# Patient Record
Sex: Female | Born: 1985
Health system: Southern US, Community
[De-identification: ages and names within clinical notes are randomized; demographics above are authoritative.]

## PROBLEM LIST (undated history)

## (undated) ENCOUNTER — Inpatient Hospital Stay (HOSPITAL_COMMUNITY): Payer: 59

## (undated) DIAGNOSIS — K859 Acute pancreatitis without necrosis or infection, unspecified: Secondary | ICD-10-CM

## (undated) DIAGNOSIS — F419 Anxiety disorder, unspecified: Secondary | ICD-10-CM

## (undated) DIAGNOSIS — K219 Gastro-esophageal reflux disease without esophagitis: Secondary | ICD-10-CM

## (undated) DIAGNOSIS — R87629 Unspecified abnormal cytological findings in specimens from vagina: Secondary | ICD-10-CM

## (undated) HISTORY — DX: Unspecified abnormal cytological findings in specimens from vagina: R87.629

## (undated) HISTORY — DX: Anxiety disorder, unspecified: F41.9

## (undated) HISTORY — PX: ECTOPIC PREGNANCY SURGERY: SHX613

---

## 2009-08-18 ENCOUNTER — Ambulatory Visit: Payer: Self-pay | Admitting: Radiology

## 2009-08-18 ENCOUNTER — Emergency Department (HOSPITAL_BASED_OUTPATIENT_CLINIC_OR_DEPARTMENT_OTHER): Admission: EM | Admit: 2009-08-18 | Discharge: 2009-08-18 | Payer: Self-pay | Admitting: Emergency Medicine

## 2010-11-27 ENCOUNTER — Emergency Department (HOSPITAL_BASED_OUTPATIENT_CLINIC_OR_DEPARTMENT_OTHER)
Admission: EM | Admit: 2010-11-27 | Discharge: 2010-11-27 | Payer: Self-pay | Source: Home / Self Care | Admitting: Emergency Medicine

## 2010-11-27 LAB — COMPREHENSIVE METABOLIC PANEL
ALT: 14 U/L (ref 0–35)
Alkaline Phosphatase: 66 U/L (ref 39–117)
BUN: 6 mg/dL (ref 6–23)
CO2: 29 mEq/L (ref 19–32)
Calcium: 9.4 mg/dL (ref 8.4–10.5)
Chloride: 107 mEq/L (ref 96–112)
Creatinine, Ser: 0.8 mg/dL (ref 0.4–1.2)
GFR calc non Af Amer: >60 mL/min (ref >60)

## 2010-11-27 LAB — DIFFERENTIAL
Basophils Absolute: 0 10*3/uL (ref 0.0–0.1)
Basophils Relative: 0 % (ref 0–1)
Eosinophils Absolute: 0 10*3/uL (ref 0.0–0.7)
Eosinophils Relative: 0 % (ref 0–5)
Lymphocytes Relative: 25 % (ref 12–46)
Lymphs Abs: 1.8 10*3/uL (ref 0.7–4.0)
Monocytes Absolute: 0.4 10*3/uL (ref 0.1–1.0)
Neutrophils Relative %: 70 % (ref 43–77)

## 2010-11-27 LAB — CBC
HCT: 39.2 % (ref 36.0–46.0)
Hemoglobin: 13.5 g/dL (ref 12.0–15.0)
MCH: 28.3 pg (ref 26.0–34.0)
Platelets: 190 10*3/uL (ref 150–400)
RDW: 13.6 % (ref 11.5–15.5)

## 2010-11-27 LAB — URINALYSIS, ROUTINE W REFLEX MICROSCOPIC
Hgb urine dipstick: NEGATIVE
Nitrite: NEGATIVE
pH: 8.5 — ABNORMAL HIGH (ref 5.0–8.0)

## 2010-11-27 LAB — WET PREP, GENITAL: Trich, Wet Prep: NONE SEEN

## 2010-11-28 LAB — GC/CHLAMYDIA PROBE AMP, GENITAL: Chlamydia, DNA Probe: NEGATIVE

## 2011-01-31 LAB — URINALYSIS, ROUTINE W REFLEX MICROSCOPIC
Bilirubin Urine: NEGATIVE
Nitrite: NEGATIVE
Specific Gravity, Urine: 1.01 (ref 1.005–1.030)
Urobilinogen, UA: 0.2 mg/dL (ref 0.0–1.0)
pH: 7 (ref 5.0–8.0)

## 2011-01-31 LAB — CBC
HCT: 43.1 % (ref 36.0–46.0)
MCHC: 33.1 g/dL (ref 30.0–36.0)
MCV: 85.2 fL (ref 78.0–100.0)
RDW: 13.3 % (ref 11.5–15.5)
WBC: 5.5 10*3/uL (ref 4.0–10.5)

## 2011-01-31 LAB — BASIC METABOLIC PANEL
BUN: 8 mg/dL (ref 6–23)
GFR calc Af Amer: >60 mL/min (ref >60)
Sodium: 141 mEq/L (ref 135–145)

## 2011-01-31 LAB — DIFFERENTIAL
Eosinophils Relative: 0 % (ref 0–5)
Monocytes Absolute: 0.3 10*3/uL (ref 0.1–1.0)
Neutro Abs: 3.1 10*3/uL (ref 1.7–7.7)

## 2011-07-30 ENCOUNTER — Encounter: Payer: Self-pay | Admitting: *Deleted

## 2011-07-30 ENCOUNTER — Other Ambulatory Visit: Payer: Self-pay

## 2011-07-30 ENCOUNTER — Emergency Department (HOSPITAL_BASED_OUTPATIENT_CLINIC_OR_DEPARTMENT_OTHER)
Admission: EM | Admit: 2011-07-30 | Discharge: 2011-07-31 | Disposition: A | Discharge status: Home / Self Care | Payer: 59 | Attending: Emergency Medicine | Admitting: Emergency Medicine

## 2011-07-30 DIAGNOSIS — R071 Chest pain on breathing: Secondary | ICD-10-CM | POA: Insufficient documentation

## 2011-07-30 DIAGNOSIS — R0789 Other chest pain: Secondary | ICD-10-CM

## 2011-07-30 DIAGNOSIS — IMO0001 Reserved for inherently not codable concepts without codable children: Secondary | ICD-10-CM | POA: Insufficient documentation

## 2011-07-30 DIAGNOSIS — F17200 Nicotine dependence, unspecified, uncomplicated: Secondary | ICD-10-CM | POA: Insufficient documentation

## 2011-07-30 NOTE — ED Notes (Signed)
Pt states that CP worsens when she has to use her left hand for movement. Denies SOB, no N/V, no diaphoresis.

## 2011-07-30 NOTE — ED Notes (Signed)
Lifting boxes at work. Sternal pain afterward for the past week. Painful with movement or when she picks up objects. States she feels like she pulled a muscle.

## 2011-07-30 NOTE — ED Notes (Signed)
PCXR completed per MD orders, NAD noted at this time.

## 2011-07-30 NOTE — ED Provider Notes (Signed)
History     CSN: [id number] Arrival date & time: 07/30/2011  9:50 PM  Chief Complaint  Patient presents with  . Muscle Pain    (Consider location/radiation/quality/duration/timing/severity/associated sxs/prior treatment) Patient is a 25 y.o. female presenting with chest pain. The history is provided by the patient.  Chest Pain The chest pain began 5 - 7 days ago. Duration of episode(s) is 2 minutes. Chest pain occurs intermittently. The chest pain is unchanged. Pertinent negatives for primary symptoms include no fever, no shortness of breath and no abdominal pain.   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 No known trauma otherwise or rash or SOB.  She denies any leg pain or swelling or any h/o DVT/ PE>  She denies any early FH of CAD in her parents or siblings. No DM, HTN or HLD. No cough or fevers or recent illness. Severity os moderatre, no pain at this time. No h/o same.   History reviewed. No pertinent past medical history.  History reviewed. No pertinent past surgical history.  No family history on file.  History  Substance Use Topics  . Smoking status: Current Everyday Smoker -- 0.5 packs/day  . Smokeless tobacco: Not on file  . Alcohol Use: Yes    OB History    Grav Para Term Preterm Abortions TAB SAB Ect Mult Living                  Review of Systems  Constitutional: Negative for fever and chills.  HENT: Negative for neck pain and neck stiffness.   Eyes: Negative for pain.  Respiratory: Negative for shortness of breath.   Cardiovascular: Positive for chest pain.  Gastrointestinal: Negative for abdominal pain.  Genitourinary: Negative for dysuria.  Musculoskeletal: Negative for back pain.  Skin: Negative for rash.  Neurological: Negative for headaches.  All other systems reviewed and are  negative.    Allergies  Review of patient's allergies indicates no known allergies.  Home Medications  No current outpatient prescriptions on file.  BP 124/68  Pulse 70  Temp(Src) 98.5 F (36.9 C) (Oral)  Resp 20  SpO2 100%  Physical Exam  Constitutional: She is oriented to person, place, and time. She appears well-developed and well-nourished.  HENT:  Head: Normocephalic and atraumatic.  Eyes: Conjunctivae and EOM are normal. Pupils are equal, round, and reactive to light.  Neck: Trachea normal. Neck supple. No thyromegaly present.  Cardiovascular: Normal rate, regular rhythm, S1 normal, S2 normal and normal pulses.     No systolic murmur is present   No diastolic murmur is present  Pulses:      Radial pulses are 2+ on the right side, and 2+ on the left side.  Pulmonary/Chest: Effort normal and breath sounds normal. She has no wheezes. She has no rhonchi. She has no rales. She exhibits no tenderness.       Reproducible tenderness with movement if her left arm, localizes pain to left mid parasternal border.   Abdominal: Soft. Normal appearance and bowel sounds are normal. There is no tenderness. There is no CVA tenderness and negative Murphy's sign.  Musculoskeletal:       BLE:s Calves nontender, no cords or erythema, negative Homans sign  Neurological: She is alert and oriented to person, place, and time. She has normal strength. No cranial nerve deficit  or sensory deficit. GCS eye subscore is 4. GCS verbal subscore is 5. GCS motor subscore is 6.  Skin: Skin is warm and dry. No rash noted. She is not diaphoretic.  Psychiatric: Her speech is normal.       Cooperative and appropriate    ED Course  Procedures (including critical care time)   Date: 07/30/2011  Rate: 67  Rhythm: normal sinus rhythm  QRS Axis: normal  Intervals: normal  ST/T Wave abnormalities: nonspecific ST changes  Conduction Disutrbances:none  Narrative Interpretation: PAC  Old EKG Reviewed: none  available     MDM    CP with presentation c/w MSK origin. ECG reviewed no ischemia. CXR obtained and reviewed. Plan NSAIDs and return here or be evaluated again any for persistent or any worsening condition. Doubt ACS, pericarditis, PE, dissection. No PTX or PNA.        Sunnie Nielsen, MD 07/31/11 [phone]

## 2011-07-31 ENCOUNTER — Other Ambulatory Visit (HOSPITAL_BASED_OUTPATIENT_CLINIC_OR_DEPARTMENT_OTHER): Payer: Self-pay | Admitting: Emergency Medicine

## 2011-07-31 ENCOUNTER — Emergency Department (INDEPENDENT_AMBULATORY_CARE_PROVIDER_SITE_OTHER)
Admission: RE | Admit: 2011-07-31 | Discharge: 2011-07-31 | Disposition: A | Discharge status: Home / Self Care | Payer: 59 | Source: Ambulatory Visit | Attending: Emergency Medicine | Admitting: Emergency Medicine

## 2011-07-31 DIAGNOSIS — R079 Chest pain, unspecified: Secondary | ICD-10-CM

## 2011-07-31 MED ORDER — NAPROXEN 500 MG PO TABS: 500.0000 mg | ORAL_TABLET | Freq: Two times a day (BID) | ORAL | Status: DC

## 2012-04-16 ENCOUNTER — Emergency Department (HOSPITAL_BASED_OUTPATIENT_CLINIC_OR_DEPARTMENT_OTHER)
Admission: EM | Admit: 2012-04-16 | Discharge: 2012-04-16 | Disposition: A | Discharge status: Home / Self Care | Payer: 59 | Attending: Emergency Medicine | Admitting: Emergency Medicine

## 2012-04-16 ENCOUNTER — Encounter (HOSPITAL_BASED_OUTPATIENT_CLINIC_OR_DEPARTMENT_OTHER): Payer: Self-pay | Admitting: *Deleted

## 2012-04-16 DIAGNOSIS — R079 Chest pain, unspecified: Secondary | ICD-10-CM | POA: Insufficient documentation

## 2012-04-16 DIAGNOSIS — K219 Gastro-esophageal reflux disease without esophagitis: Secondary | ICD-10-CM | POA: Insufficient documentation

## 2012-04-16 DIAGNOSIS — F17200 Nicotine dependence, unspecified, uncomplicated: Secondary | ICD-10-CM | POA: Insufficient documentation

## 2012-04-16 HISTORY — DX: Gastro-esophageal reflux disease without esophagitis: K21.9

## 2012-04-16 LAB — COMPREHENSIVE METABOLIC PANEL
ALT: 7 U/L (ref 0–35)
BUN: 10 mg/dL (ref 6–23)
Calcium: 9.1 mg/dL (ref 8.4–10.5)
GFR calc Af Amer: >90 mL/min (ref >90)
Glucose, Bld: 99 mg/dL (ref 70–99)
Sodium: 138 mEq/L (ref 135–145)
Total Protein: 6.7 g/dL (ref 6.0–8.3)

## 2012-04-16 LAB — LIPASE, BLOOD: Lipase: 51 U/L (ref 11–59)

## 2012-04-16 LAB — DIFFERENTIAL
Eosinophils Absolute: 0 10*3/uL (ref 0.0–0.7)
Eosinophils Relative: 1 % (ref 0–5)
Lymphs Abs: 1.9 10*3/uL (ref 0.7–4.0)

## 2012-04-16 LAB — CBC
MCH: 27.5 pg (ref 26.0–34.0)
MCHC: 33.9 g/dL (ref 30.0–36.0)
MCV: 81.1 fL (ref 78.0–100.0)
Platelets: 189 10*3/uL (ref 150–400)
RBC: 4.03 MIL/uL (ref 3.87–5.11)

## 2012-04-16 MED ORDER — FAMOTIDINE 40 MG PO TABS: 40.0000 mg | ORAL_TABLET | Freq: Every day | ORAL | Status: DC

## 2012-04-16 MED ORDER — OMEPRAZOLE 40 MG PO CPDR: 40.0000 mg | DELAYED_RELEASE_CAPSULE | Freq: Every day | ORAL | Status: DC

## 2012-04-16 MED ORDER — GI COCKTAIL ~~LOC~~
30.0000 mL | Freq: Once | ORAL | Status: AC
  Administered 2012-04-16: 30 mL via ORAL
  Filled 2012-04-16: qty 30

## 2012-04-16 NOTE — ED Provider Notes (Addendum)
History     CSN: [id number]  Arrival date & time 04/16/12  1442   First MD Initiated Contact with Patient 04/16/12 1510      Chief Complaint  Patient presents with  . Chest Pain    (Consider location/radiation/quality/duration/timing/severity/associated sxs/prior treatment) Patient is a 26 y.o. female presenting with abdominal pain. The history is provided by the patient. No language interpreter was used.  Abdominal Pain The primary symptoms of the illness include abdominal pain and nausea. Episode onset: 3 months ago. The onset of the illness was gradual. The problem has been gradually worsening.  The patient states that she believes she is currently not pregnant. Additional symptoms associated with the illness include heartburn. Significant associated medical issues include PUD and GERD.   patient reports she was seen and evaluated by Dr. Ewing Schlein.approximately 3 months ago she was placed on Pepcid and Prilosec patient reports she has been taking both everyday she missed a dose 4 days ago and had return of epigastric abdominal pain. She reports the medicines normally relieve her symptoms however after taking the medicines for 3 days she has not had any relief. She is supposed to followup with Dr. Ewing Schlein for endoscopy.  Past Medical History  Diagnosis Date  . GERD (gastroesophageal reflux disease)     History reviewed. No pertinent past surgical history.  No family history on file.  History  Substance Use Topics  . Smoking status: Current Everyday Smoker -- 0.5 packs/day  . Smokeless tobacco: Not on file  . Alcohol Use: Yes    OB History    Grav Para Term Preterm Abortions TAB SAB Ect Mult Living                  Review of Systems  Gastrointestinal: Positive for heartburn, nausea and abdominal pain.  All other systems reviewed and are negative.    Allergies  Review of patient's allergies indicates no known allergies.  Home Medications   Current Outpatient Rx  Name  Route Sig Dispense Refill  . FAMOTIDINE 40 MG PO TABS Oral Take 40 mg by mouth at bedtime.    . OMEPRAZOLE 40 MG PO CPDR Oral Take 40 mg by mouth daily.      BP 112/88  Pulse 78  Temp 98.5 F (36.9 C) (Oral)  Resp 18  Ht 5\' 4"  (1.626 m)  Wt 171 lb (77.565 kg)  BMI 29.35 kg/m2  SpO2 100%  LMP 03/25/2012  Physical Exam  Nursing note and vitals reviewed. Constitutional: She is oriented to person, place, and time. She appears well-developed and well-nourished.  HENT:  Head: Normocephalic and atraumatic.  Right Ear: External ear normal.  Left Ear: External ear normal.  Nose: Nose normal.  Mouth/Throat: Oropharynx is clear and moist.  Eyes: Conjunctivae are normal. Pupils are equal, round, and reactive to light.  Neck: Normal range of motion. Neck supple.  Cardiovascular: Normal rate, regular rhythm and normal heart sounds.   Pulmonary/Chest: Effort normal and breath sounds normal.  Abdominal: Bowel sounds are normal. There is tenderness.  Musculoskeletal: Normal range of motion.  Neurological: She is alert and oriented to person, place, and time. She has normal reflexes.  Skin: Skin is warm.  Psychiatric: She has a normal mood and affect.    ED Course  Procedures (including critical care time)  Labs Reviewed  CBC - Abnormal; Notable for the following:    Hemoglobin 11.1 (*)     HCT 32.7 (*)     All other components within  normal limits  COMPREHENSIVE METABOLIC PANEL - Abnormal; Notable for the following:    Total Bilirubin 0.1 (*)     All other components within normal limits  DIFFERENTIAL  LIPASE, BLOOD   No results found.   1. Reflux     Results for orders placed during the hospital encounter of 04/16/12  CBC      Component Value Range   WBC 4.3  4.0 - 10.5 K/uL   RBC 4.03  3.87 - 5.11 MIL/uL   Hemoglobin 11.1 (*) 12.0 - 15.0 g/dL   HCT 16.1 (*) 09.6 - 04.5 %   MCV 81.1  78.0 - 100.0 fL   MCH 27.5  26.0 - 34.0 pg   MCHC 33.9  30.0 - 36.0 g/dL   RDW 40.9   81.1 - 91.4 %   Platelets 189  150 - 400 K/uL  DIFFERENTIAL      Component Value Range   Neutrophils Relative 45  43 - 77 %   Neutro Abs 2.0  1.7 - 7.7 K/uL   Lymphocytes Relative 43  12 - 46 %   Lymphs Abs 1.9  0.7 - 4.0 K/uL   Monocytes Relative 11  3 - 12 %   Monocytes Absolute 0.5  0.1 - 1.0 K/uL   Eosinophils Relative 1  0 - 5 %   Eosinophils Absolute 0.0  0.0 - 0.7 K/uL   Basophils Relative 1  0 - 1 %   Basophils Absolute 0.0  0.0 - 0.1 K/uL  COMPREHENSIVE METABOLIC PANEL      Component Value Range   Sodium 138  135 - 145 mEq/L   Potassium 4.1  3.5 - 5.1 mEq/L   Chloride 105  96 - 112 mEq/L   CO2 27  19 - 32 mEq/L   Glucose, Bld 99  70 - 99 mg/dL   BUN 10  6 - 23 mg/dL   Creatinine, Ser 7.82  0.50 - 1.10 mg/dL   Calcium 9.1  8.4 - 95.6 mg/dL   Total Protein 6.7  6.0 - 8.3 g/dL   Albumin 3.8  3.5 - 5.2 g/dL   AST 15  0 - 37 U/L   ALT 7  0 - 35 U/L   Alkaline Phosphatase 67  39 - 117 U/L   Total Bilirubin 0.1 (*) 0.3 - 1.2 mg/dL   GFR calc non Af Amer >90  >90 mL/min   GFR calc Af Amer >90  >90 mL/min  LIPASE, BLOOD      Component Value Range   Lipase 51  11 - 59 U/L   No results found.  Date: 05/12/2012  Rate: 68  Rhythm: normal sinus rhythm  QRS Axis: normal  Intervals: normal  ST/T Wave abnormalities: normal  Conduction Disutrbances:none  Narrative Interpretation:   Old EKG Reviewed: none available   MDM  Pt advised to call Dr. Ewing Schlein to schedule follow up/recheck.          Lonia Skinner [place], [place] 04/16/12 1750  Lonia Skinner [place], [place] 04/16/12 1751  Lonia Skinner [place], [place] 05/12/12 2021

## 2012-04-16 NOTE — ED Notes (Signed)
Chest pain. Was given acid reflux medication with relief for same pain 3 months ago. Missed a dose of medication and the pain came back 4 days ago. Points to her epigastric region where pain is intense.

## 2012-04-16 NOTE — Discharge Instructions (Signed)

## 2012-04-16 NOTE — ED Provider Notes (Signed)
Medical screening examination/treatment/procedure(s) were performed by non-physician practitioner and as supervising physician I was immediately available for consultation/collaboration.   Gwyneth Sprout, MD 04/16/12 2306

## 2012-04-17 ENCOUNTER — Telehealth (HOSPITAL_BASED_OUTPATIENT_CLINIC_OR_DEPARTMENT_OTHER): Payer: Self-pay | Admitting: *Deleted

## 2012-04-17 NOTE — ED Notes (Signed)
Patient called requesting note for light duty from work.  Chart reviewed with Dr. Bernette Mayers.  Ok to give note for 2 days of light duty, than will need to have PCP or GI specialist restrict work activity.

## 2012-04-20 NOTE — ED Notes (Signed)
Patient here today requesting a note to return to normal work with no restrictions.

## 2012-05-13 NOTE — ED Provider Notes (Signed)
Medical screening examination/treatment/procedure(s) were performed by non-physician practitioner and as supervising physician I was immediately available for consultation/collaboration.  Geoffery Lyons, MD 05/13/12 [phone]

## 2013-04-03 ENCOUNTER — Encounter (HOSPITAL_BASED_OUTPATIENT_CLINIC_OR_DEPARTMENT_OTHER): Payer: Self-pay | Admitting: *Deleted

## 2013-04-03 ENCOUNTER — Emergency Department (HOSPITAL_BASED_OUTPATIENT_CLINIC_OR_DEPARTMENT_OTHER)
Admission: EM | Admit: 2013-04-03 | Discharge: 2013-04-03 | Disposition: A | Discharge status: Home / Self Care | Payer: 59 | Attending: Emergency Medicine | Admitting: Emergency Medicine

## 2013-04-03 DIAGNOSIS — Z79899 Other long term (current) drug therapy: Secondary | ICD-10-CM | POA: Insufficient documentation

## 2013-04-03 DIAGNOSIS — F17200 Nicotine dependence, unspecified, uncomplicated: Secondary | ICD-10-CM | POA: Insufficient documentation

## 2013-04-03 DIAGNOSIS — K219 Gastro-esophageal reflux disease without esophagitis: Secondary | ICD-10-CM | POA: Insufficient documentation

## 2013-04-03 DIAGNOSIS — R531 Weakness: Secondary | ICD-10-CM

## 2013-04-03 DIAGNOSIS — Z3202 Encounter for pregnancy test, result negative: Secondary | ICD-10-CM | POA: Insufficient documentation

## 2013-04-03 DIAGNOSIS — R5381 Other malaise: Secondary | ICD-10-CM | POA: Insufficient documentation

## 2013-04-03 DIAGNOSIS — R42 Dizziness and giddiness: Secondary | ICD-10-CM | POA: Insufficient documentation

## 2013-04-03 LAB — PREGNANCY, URINE: Preg Test, Ur: NEGATIVE

## 2013-04-03 LAB — CBC WITH DIFFERENTIAL/PLATELET
Basophils Absolute: 0 10*3/uL (ref 0.0–0.1)
Lymphocytes Relative: 45 % (ref 12–46)
Neutro Abs: 2.6 10*3/uL (ref 1.7–7.7)
Platelets: 212 10*3/uL (ref 150–400)
RDW: 14.7 % (ref 11.5–15.5)
WBC: 5.7 10*3/uL (ref 4.0–10.5)

## 2013-04-03 LAB — URINALYSIS, ROUTINE W REFLEX MICROSCOPIC
Nitrite: NEGATIVE
Specific Gravity, Urine: 1.029 (ref 1.005–1.030)
Urobilinogen, UA: 1 mg/dL (ref 0.0–1.0)

## 2013-04-03 LAB — BASIC METABOLIC PANEL
CO2: 27 mEq/L (ref 19–32)
Calcium: 9.3 mg/dL (ref 8.4–10.5)
Chloride: 105 mEq/L (ref 96–112)
Sodium: 140 mEq/L (ref 135–145)

## 2013-04-03 MED ORDER — SODIUM CHLORIDE 0.9 % IV BOLUS (SEPSIS)
1000.0000 mL | Freq: Once | INTRAVENOUS | Status: AC
  Administered 2013-04-03: 1000 mL via INTRAVENOUS

## 2013-04-03 NOTE — ED Provider Notes (Signed)
History     CSN: [id number]  Arrival date & time 04/03/13  0150   First MD Initiated Contact with Patient 04/03/13 0204      Chief Complaint  Patient presents with  . Fatigue    (Consider location/radiation/quality/duration/timing/severity/associated sxs/prior treatment) HPI This is a 27 year old female who was felt weak and fatigued since yesterday afternoon. She states she spent most of the day in bed. The weakness persists today. Symptoms are moderate to severe. There are no known exacerbating or mitigating factors. She denies fever, chills, headache, cold symptoms, sore throat, lymphadenopathy, chest pain, dyspnea, cough, abdominal pain, nausea, vomiting, diarrhea, tissue area, vaginal bleeding or vaginal discharge. She has had lightheadedness. She denies decreased appetite or fluid intake. She states she has heavy menstrual periods.  Past Medical History  Diagnosis Date  . GERD (gastroesophageal reflux disease)     History reviewed. No pertinent past surgical history.  History reviewed. No pertinent family history.  History  Substance Use Topics  . Smoking status: Current Every Day Smoker -- 0.50 packs/day  . Smokeless tobacco: Not on file  . Alcohol Use: Yes    OB History   Grav Para Term Preterm Abortions TAB SAB Ect Mult Living                  Review of Systems  All other systems reviewed and are negative.    Allergies  Review of patient's allergies indicates no known allergies.  Home Medications   Current Outpatient Rx  Name  Route  Sig  Dispense  Refill  . esomeprazole (NEXIUM) 20 MG capsule   Oral   Take 20 mg by mouth daily before breakfast.         . famotidine (PEPCID) 40 MG tablet   Oral   Take 1 tablet (40 mg total) by mouth at bedtime.   30 tablet   0   . omeprazole (PRILOSEC) 40 MG capsule   Oral   Take 1 capsule (40 mg total) by mouth daily.   30 capsule   0     BP 117/63  Pulse 73  Temp(Src) 98.1 F (36.7 C) (Oral)  Resp  16  Ht 5\' 4"  (1.626 m)  Wt 155 lb (70.308 kg)  BMI 26.59 kg/m2  SpO2 100%  LMP 03/24/2013  Physical Exam General: Well-developed, well-nourished female in no acute distress; appearance consistent with age of record HENT: normocephalic, atraumatic; no pharyngeal erythema or exudate Eyes: pupils equal round and reactive to light; extraocular muscles intact Neck: supple; no lymphadenopathy; no thyromegaly Heart: regular rate and rhythm; no murmurs, rubs or gallops Lungs: clear to auscultation bilaterally Abdomen: soft; nondistended; nontender; no masses or hepatosplenomegaly; bowel sounds present Extremities: No deformity; full range of motion; pulses normal Neurologic: Awake, alert and oriented; motor function intact in all extremities and symmetric; no facial droop Skin: Warm and dry; facial acne Psychiatric: Normal mood and affect    ED Course  Procedures (including critical care time)     MDM   Nursing notes and vitals signs, including pulse oximetry, reviewed.  Summary of this visit's results, reviewed by myself:  Labs:  Results for orders placed during the hospital encounter of 04/03/13 (from the past 24 hour(s))  URINALYSIS, ROUTINE W REFLEX MICROSCOPIC     Status: None   Collection Time    04/03/13  2:00 AM      Result Value Range   Color, Urine YELLOW  YELLOW   APPearance CLEAR  CLEAR   Specific  Gravity, Urine 1.029  1.005 - 1.030   pH 6.0  5.0 - 8.0   Glucose, UA NEGATIVE  NEGATIVE mg/dL   Hgb urine dipstick NEGATIVE  NEGATIVE   Bilirubin Urine NEGATIVE  NEGATIVE   Ketones, ur NEGATIVE  NEGATIVE mg/dL   Protein, ur NEGATIVE  NEGATIVE mg/dL   Urobilinogen, UA 1.0  0.0 - 1.0 mg/dL   Nitrite NEGATIVE  NEGATIVE   Leukocytes, UA NEGATIVE  NEGATIVE  PREGNANCY, URINE     Status: None   Collection Time    04/03/13  2:00 AM      Result Value Range   Preg Test, Ur NEGATIVE  NEGATIVE  CBC WITH DIFFERENTIAL     Status: Abnormal   Collection Time    04/03/13  2:18  AM      Result Value Range   WBC 5.7  4.0 - 10.5 K/uL   RBC 4.30  3.87 - 5.11 MIL/uL   Hemoglobin 11.4 (*) 12.0 - 15.0 g/dL   HCT 81.1 (*) 91.4 - 78.2 %   MCV 80.7  78.0 - 100.0 fL   MCH 26.5  26.0 - 34.0 pg   MCHC 32.9  30.0 - 36.0 g/dL   RDW 95.6  21.3 - 08.6 %   Platelets 212  150 - 400 K/uL   Neutrophils Relative % 47  43 - 77 %   Neutro Abs 2.6  1.7 - 7.7 K/uL   Lymphocytes Relative 45  12 - 46 %   Lymphs Abs 2.6  0.7 - 4.0 K/uL   Monocytes Relative 7  3 - 12 %   Monocytes Absolute 0.4  0.1 - 1.0 K/uL   Eosinophils Relative 1  0 - 5 %   Eosinophils Absolute 0.0  0.0 - 0.7 K/uL   Basophils Relative 0  0 - 1 %   Basophils Absolute 0.0  0.0 - 0.1 K/uL  BASIC METABOLIC PANEL     Status: Abnormal   Collection Time    04/03/13  2:18 AM      Result Value Range   Sodium 140  135 - 145 mEq/L   Potassium 4.2  3.5 - 5.1 mEq/L   Chloride 105  96 - 112 mEq/L   CO2 27  19 - 32 mEq/L   Glucose, Bld 90  70 - 99 mg/dL   BUN 7  6 - 23 mg/dL   Creatinine, Ser 5.78  0.50 - 1.10 mg/dL   Calcium 9.3  8.4 - 46.9 mg/dL   GFR calc non Af Amer 87 (*) >90 mL/min   GFR calc Af Amer >90  >90 mL/min   Patient given 1L NS bolus in ED due to elevated urine Tobe Sos, MD 04/03/13 [phone]

## 2013-04-03 NOTE — ED Notes (Signed)
Pt c/o weakness and generalized fatigue since early Friday. Pt denies any N/V/D.

## 2013-05-06 ENCOUNTER — Inpatient Hospital Stay (HOSPITAL_BASED_OUTPATIENT_CLINIC_OR_DEPARTMENT_OTHER)
Admission: EM | Admit: 2013-05-06 | Discharge: 2013-05-06 | Disposition: A | Discharge status: Home / Self Care | Payer: 59 | Attending: Obstetrics & Gynecology | Admitting: Obstetrics & Gynecology

## 2013-05-06 ENCOUNTER — Inpatient Hospital Stay (HOSPITAL_COMMUNITY): Payer: 59

## 2013-05-06 ENCOUNTER — Encounter (HOSPITAL_BASED_OUTPATIENT_CLINIC_OR_DEPARTMENT_OTHER): Payer: Self-pay | Admitting: Emergency Medicine

## 2013-05-06 DIAGNOSIS — O00109 Unspecified tubal pregnancy without intrauterine pregnancy: Secondary | ICD-10-CM | POA: Insufficient documentation

## 2013-05-06 DIAGNOSIS — O009 Unspecified ectopic pregnancy without intrauterine pregnancy: Secondary | ICD-10-CM

## 2013-05-06 DIAGNOSIS — R109 Unspecified abdominal pain: Secondary | ICD-10-CM | POA: Insufficient documentation

## 2013-05-06 LAB — ABO/RH: ABO/RH(D): O POS

## 2013-05-06 LAB — URINE MICROSCOPIC-ADD ON

## 2013-05-06 LAB — CBC WITH DIFFERENTIAL/PLATELET
Basophils Absolute: 0 10*3/uL (ref 0.0–0.1)
Basophils Relative: 0 % (ref 0–1)
HCT: 31.9 % — ABNORMAL LOW (ref 36.0–46.0)
Lymphocytes Relative: 35 % (ref 12–46)
Neutro Abs: 3.8 10*3/uL (ref 1.7–7.7)
Neutrophils Relative %: 55 % (ref 43–77)
Platelets: 194 10*3/uL (ref 150–400)
RDW: 14.7 % (ref 11.5–15.5)
WBC: 6.9 10*3/uL (ref 4.0–10.5)

## 2013-05-06 LAB — BASIC METABOLIC PANEL
CO2: 25 mEq/L (ref 19–32)
Chloride: 102 mEq/L (ref 96–112)
GFR calc Af Amer: >90 mL/min (ref >90)
Potassium: 3.7 mEq/L (ref 3.5–5.1)
Sodium: 136 mEq/L (ref 135–145)

## 2013-05-06 LAB — GC/CHLAMYDIA PROBE AMP
CT Probe RNA: NEGATIVE
GC Probe RNA: NEGATIVE

## 2013-05-06 LAB — ALT: ALT: 9 U/L (ref 0–35)

## 2013-05-06 LAB — AST: AST: 11 U/L (ref 0–37)

## 2013-05-06 LAB — WET PREP, GENITAL

## 2013-05-06 LAB — URINALYSIS, ROUTINE W REFLEX MICROSCOPIC
Ketones, ur: NEGATIVE mg/dL
Leukocytes, UA: NEGATIVE
Nitrite: NEGATIVE
Urobilinogen, UA: 0.2 mg/dL (ref 0.0–1.0)
pH: 6 (ref 5.0–8.0)

## 2013-05-06 LAB — PREGNANCY, URINE: Preg Test, Ur: POSITIVE — AB

## 2013-05-06 MED ORDER — FENTANYL CITRATE 0.05 MG/ML IJ SOLN
100.0000 ug | Freq: Once | INTRAMUSCULAR | Status: AC
  Administered 2013-05-06: 100 ug via INTRAVENOUS
  Filled 2013-05-06: qty 2

## 2013-05-06 MED ORDER — OXYCODONE-ACETAMINOPHEN 5-325 MG PO TABS: 1.0000 | ORAL_TABLET | ORAL | Status: DC | PRN

## 2013-05-06 MED ORDER — OXYCODONE-ACETAMINOPHEN 5-325 MG PO TABS
2.0000 | ORAL_TABLET | Freq: Once | ORAL | Status: AC
  Administered 2013-05-06: 2 via ORAL
  Filled 2013-05-06: qty 2

## 2013-05-06 MED ORDER — SODIUM CHLORIDE 0.9 % IV BOLUS (SEPSIS)
500.0000 mL | Freq: Once | INTRAVENOUS | Status: AC
  Administered 2013-05-06: 500 mL via INTRAVENOUS

## 2013-05-06 MED ORDER — METHOTREXATE INJECTION FOR WOMEN'S HOSPITAL
50.0000 mg/m2 | Freq: Once | INTRAMUSCULAR | Status: AC
  Administered 2013-05-06: 90 mg via INTRAMUSCULAR
  Filled 2013-05-06: qty 1.8

## 2013-05-06 NOTE — ED Provider Notes (Signed)
History    CSN: [id number] Arrival date & time 05/06/13  0056  First MD Initiated Contact with Patient 05/06/13 0129     Chief Complaint  Patient presents with  . Abdominal Pain   (Consider location/radiation/quality/duration/timing/severity/associated sxs/prior Treatment) Patient is a 27 y.o. female presenting with abdominal pain.  Abdominal Pain This is a new problem. The current episode started yesterday. The problem occurs constantly. The problem has not changed since onset.Associated symptoms include abdominal pain. Pertinent negatives include no chest pain, no headaches and no shortness of breath. Nothing aggravates the symptoms. Nothing relieves the symptoms. She has tried nothing for the symptoms. The treatment provided no relief.   Past Medical History  Diagnosis Date  . GERD (gastroesophageal reflux disease)    History reviewed. No pertinent past surgical history. No family history on file. History  Substance Use Topics  . Smoking status: Current Every Day Smoker -- 0.50 packs/day  . Smokeless tobacco: Not on file  . Alcohol Use: Yes   OB History   Grav Para Term Preterm Abortions TAB SAB Ect Mult Living                 Review of Systems  Constitutional: Negative for fever.  Respiratory: Negative for shortness of breath.   Cardiovascular: Negative for chest pain.  Gastrointestinal: Positive for abdominal pain. Negative for nausea and vomiting.  Genitourinary: Positive for vaginal bleeding and pelvic pain. Negative for vaginal discharge.  Neurological: Negative for headaches.  All other systems reviewed and are negative.    Allergies  Review of patient's allergies indicates no known allergies.  Home Medications   Current Outpatient Rx  Name  Route  Sig  Dispense  Refill  . ibuprofen (ADVIL,MOTRIN) 200 MG tablet   Oral   Take 800 mg by mouth once.         Marland Kitchen omeprazole (PRILOSEC) 40 MG capsule   Oral   Take 1 capsule (40 mg total) by mouth daily.  30 capsule   0   . esomeprazole (NEXIUM) 20 MG capsule   Oral   Take 20 mg by mouth daily before breakfast.         . famotidine (PEPCID) 40 MG tablet   Oral   Take 1 tablet (40 mg total) by mouth at bedtime.   30 tablet   0    BP 139/87  Pulse 80  Temp(Src) 98.6 F (37 C) (Oral)  Resp 18  Ht 5\' 4"  (1.626 m)  Wt 165 lb (74.844 kg)  BMI 28.31 kg/m2  SpO2 100%  LMP 05/03/2013 Physical Exam  Constitutional: She is oriented to person, place, and time. She appears well-developed and well-nourished. No distress.  HENT:  Head: Normocephalic and atraumatic.  Mouth/Throat: Oropharynx is clear and moist.  Eyes: Conjunctivae are normal. Pupils are equal, round, and reactive to light.  Neck: Normal range of motion. Neck supple.  Cardiovascular: Normal rate, regular rhythm and intact distal pulses.   Pulmonary/Chest: Effort normal and breath sounds normal. She has no wheezes. She has no rales.  Abdominal: Soft. Bowel sounds are normal. She exhibits no distension. There is tenderness in the suprapubic area. There is no rigidity, no rebound, no guarding, no tenderness at McBurney's point and negative Murphy's sign.  Genitourinary: Cervix exhibits motion tenderness. Cervix exhibits no discharge. Right adnexum displays tenderness. Right adnexum displays no mass. Left adnexum displays no mass. There is bleeding around the vagina.  Musculoskeletal: Normal range of motion.  Neurological: She is alert and  oriented to person, place, and time.  Skin: Skin is warm and dry.  Psychiatric: She has a normal mood and affect.    ED Course  Procedures (including critical care time) Labs Reviewed  PREGNANCY, URINE - Abnormal; Notable for the following:    Preg Test, Ur POSITIVE (*)    All other components within normal limits  URINALYSIS, ROUTINE W REFLEX MICROSCOPIC - Abnormal; Notable for the following:    Hgb urine dipstick LARGE (*)    All other components within normal limits  URINE  MICROSCOPIC-ADD ON - Abnormal; Notable for the following:    Squamous Epithelial / LPF FEW (*)    Bacteria, UA FEW (*)    All other components within normal limits  CBC WITH DIFFERENTIAL - Abnormal; Notable for the following:    Hemoglobin 10.4 (*)    HCT 31.9 (*)    All other components within normal limits  WET PREP, GENITAL  GC/CHLAMYDIA PROBE AMP  BASIC METABOLIC PANEL  HCG, QUANTITATIVE, PREGNANCY  ABO/RH   No results found. No diagnosis found.  MDM  R/o ectopic  Results for orders placed during the hospital encounter of 05/06/13  WET PREP, GENITAL      Result Value Range   Yeast Wet Prep HPF POC MANY (*) NONE SEEN   Trich, Wet Prep NONE SEEN  NONE SEEN   Clue Cells Wet Prep HPF POC FEW (*) NONE SEEN   WBC, Wet Prep HPF POC FEW (*) NONE SEEN  PREGNANCY, URINE      Result Value Range   Preg Test, Ur POSITIVE (*) NEGATIVE  URINALYSIS, ROUTINE W REFLEX MICROSCOPIC      Result Value Range   Color, Urine YELLOW  YELLOW   APPearance CLEAR  CLEAR   Specific Gravity, Urine 1.012  1.005 - 1.030   pH 6.0  5.0 - 8.0   Glucose, UA NEGATIVE  NEGATIVE mg/dL   Hgb urine dipstick LARGE (*) NEGATIVE   Bilirubin Urine NEGATIVE  NEGATIVE   Ketones, ur NEGATIVE  NEGATIVE mg/dL   Protein, ur NEGATIVE  NEGATIVE mg/dL   Urobilinogen, UA 0.2  0.0 - 1.0 mg/dL   Nitrite NEGATIVE  NEGATIVE   Leukocytes, UA NEGATIVE  NEGATIVE  URINE MICROSCOPIC-ADD ON      Result Value Range   Squamous Epithelial / LPF FEW (*) RARE   WBC, UA 0-2  <3 WBC/hpf   RBC / HPF 21-50  <3 RBC/hpf   Bacteria, UA FEW (*) RARE  CBC WITH DIFFERENTIAL      Result Value Range   WBC 6.9  4.0 - 10.5 K/uL   RBC 3.96  3.87 - 5.11 MIL/uL   Hemoglobin 10.4 (*) 12.0 - 15.0 g/dL   HCT 40.9 (*) 81.1 - 91.4 %   MCV 80.6  78.0 - 100.0 fL   MCH 26.3  26.0 - 34.0 pg   MCHC 32.6  30.0 - 36.0 g/dL   RDW 78.2  95.6 - 21.3 %   Platelets 194  150 - 400 K/uL   Neutrophils Relative % 55  43 - 77 %   Neutro Abs 3.8  1.7 - 7.7 K/uL    Lymphocytes Relative 35  12 - 46 %   Lymphs Abs 2.4  0.7 - 4.0 K/uL   Monocytes Relative 10  3 - 12 %   Monocytes Absolute 0.7  0.1 - 1.0 K/uL   Eosinophils Relative 1  0 - 5 %   Eosinophils Absolute 0.0  0.0 - 0.7  K/uL   Basophils Relative 0  0 - 1 %   Basophils Absolute 0.0  0.0 - 0.1 K/uL  BASIC METABOLIC PANEL      Result Value Range   Sodium 136  135 - 145 mEq/L   Potassium 3.7  3.5 - 5.1 mEq/L   Chloride 102  96 - 112 mEq/L   CO2 25  19 - 32 mEq/L   Glucose, Bld 106 (*) 70 - 99 mg/dL   BUN 6  6 - 23 mg/dL   Creatinine, Ser 4.09  0.50 - 1.10 mg/dL   Calcium 9.3  8.4 - 81.1 mg/dL   GFR calc non Af Amer >90  >90 mL/min   GFR calc Af Amer >90  >90 mL/min  HCG, QUANTITATIVE, PREGNANCY      Result Value Range   hCG, Beta Chain, Quant, S 3330 (*) <5 mIU/mL   No results found. Medications  sodium chloride 0.9 % bolus 500 mL (500 mLs Intravenous New Bag/Given 05/06/13 0243)  fentaNYL (SUBLIMAZE) injection 100 mcg (100 mcg Intravenous Given 05/06/13 0244)      Elizeo Rodriques K Vanda Waskey-Rasch, MD 05/06/13 [phone]

## 2013-05-06 NOTE — MAU Provider Note (Signed)
Chief Complaint: Abdominal Pain   First Provider Initiated Contact with Patient 05/06/13 [phone]     SUBJECTIVE HPI: Joan Thomas is a 27 y.o. G1P0 at Unknown gestation by unknown LMP who was transported by EMS from [facility] to MAU for pelvic US to eval for ectopic pregnancy. Low abd pain since yesterday and spotting x~4 days. Denies passage of tissue . Has not not had any other testing this pregnancy. Quant 3300 at [facility].   Received Fentanyl prior to arrival. Rates pain 2/10. Pelvic exam done.   Past Medical History  Diagnosis Date  . GERD (gastroesophageal reflux disease)    OB History   Grav Para Term Preterm Abortions TAB SAB Ect Mult Living   1              # Outc Date GA Lbr Len/2nd Wgt Sex Del Anes PTL Lv   1 CUR              History reviewed. No pertinent past surgical history. History   Social History  . Marital Status: Single    Spouse Name: N/A    Number of Children: N/A  . Years of Education: N/A   Occupational History  . Not on file.   Social History Main Topics  . Smoking status: Current Every Day Smoker -- 0.50 packs/day    Types: Cigars  . Smokeless tobacco: Not on file  . Alcohol Use: Yes  . Drug Use: No  . Sexually Active: Not on file   Other Topics Concern  . Not on file   Social History Narrative  . No narrative on file   No current facility-administered medications on file prior to encounter.   Current Outpatient Prescriptions on File Prior to Encounter  Medication Sig Dispense Refill  . omeprazole (PRILOSEC) 40 MG capsule Take 1 capsule (40 mg total) by mouth daily.  30 capsule  0   No Known Allergies  ROS: Pertinent items in HPI  OBJECTIVE Blood pressure 135/72, pulse 88, temperature 98.6 F (37 C), temperature source Oral, resp. rate 18, height 5\' 4"  (1.626 m), weight 74.844 kg (165 lb), SpO2 100.00%. GENERAL: Well-developed, well-nourished female in no acute distress.  HEENT: Normocephalic HEART: normal rate RESP:  normal effort ABDOMEN: Soft, non-tender EXTREMITIES: Nontender, no edema NEURO: Alert and oriented SPECULUM EXAM: Deferred.   LAB RESULTS Results for orders placed during the hospital encounter of 05/06/13 (from the past 24 hour(s))  PREGNANCY, URINE     Status: Abnormal   Collection Time    05/06/13  1:03 AM      Result Value Range   Preg Test, Ur POSITIVE (*) NEGATIVE  URINALYSIS, ROUTINE W REFLEX MICROSCOPIC     Status: Abnormal   Collection Time    05/06/13  1:03 AM      Result Value Range   Color, Urine YELLOW  YELLOW   APPearance CLEAR  CLEAR   Specific Gravity, Urine 1.012  1.005 - 1.030   pH 6.0  5.0 - 8.0   Glucose, UA NEGATIVE  NEGATIVE mg/dL   Hgb urine dipstick LARGE (*) NEGATIVE   Bilirubin Urine NEGATIVE  NEGATIVE   Ketones, ur NEGATIVE  NEGATIVE mg/dL   Protein, ur NEGATIVE  NEGATIVE mg/dL   Urobilinogen, UA 0.2  0.0 - 1.0 mg/dL   Nitrite NEGATIVE  NEGATIVE   Leukocytes, UA NEGATIVE  NEGATIVE  URINE MICROSCOPIC-ADD ON     Status: Abnormal   Collection Time    05/06/13  1:03 AM  Result Value Range   Squamous Epithelial / LPF FEW (*) RARE   WBC, UA 0-2  <3 WBC/hpf   RBC / HPF 21-50  <3 RBC/hpf   Bacteria, UA FEW (*) RARE  CBC WITH DIFFERENTIAL     Status: Abnormal   Collection Time    05/06/13  1:50 AM      Result Value Range   WBC 6.9  4.0 - 10.5 K/uL   RBC 3.96  3.87 - 5.11 MIL/uL   Hemoglobin 10.4 (*) 12.0 - 15.0 g/dL   HCT 14.7 (*) 82.9 - 56.2 %   MCV 80.6  78.0 - 100.0 fL   MCH 26.3  26.0 - 34.0 pg   MCHC 32.6  30.0 - 36.0 g/dL   RDW 13.0  86.5 - 78.4 %   Platelets 194  150 - 400 K/uL   Neutrophils Relative % 55  43 - 77 %   Neutro Abs 3.8  1.7 - 7.7 K/uL   Lymphocytes Relative 35  12 - 46 %   Lymphs Abs 2.4  0.7 - 4.0 K/uL   Monocytes Relative 10  3 - 12 %   Monocytes Absolute 0.7  0.1 - 1.0 K/uL   Eosinophils Relative 1  0 - 5 %   Eosinophils Absolute 0.0  0.0 - 0.7 K/uL   Basophils Relative 0  0 - 1 %   Basophils Absolute 0.0  0.0 -  0.1 K/uL  BASIC METABOLIC PANEL     Status: Abnormal   Collection Time    05/06/13  1:50 AM      Result Value Range   Sodium 136  135 - 145 mEq/L   Potassium 3.7  3.5 - 5.1 mEq/L   Chloride 102  96 - 112 mEq/L   CO2 25  19 - 32 mEq/L   Glucose, Bld 106 (*) 70 - 99 mg/dL   BUN 6  6 - 23 mg/dL   Creatinine, Ser 6.96  0.50 - 1.10 mg/dL   Calcium 9.3  8.4 - 29.5 mg/dL   GFR calc non Af Amer >90  >90 mL/min   GFR calc Af Amer >90  >90 mL/min  ABO/RH     Status: None   Collection Time    05/06/13  1:50 AM      Result Value Range   ABO/RH(D) O POS     No rh immune globuloin NOT A RH IMMUNE GLOBULIN CANDIDATE, PT RH POSITIVE    HCG, QUANTITATIVE, PREGNANCY     Status: Abnormal   Collection Time    05/06/13  1:50 AM      Result Value Range   hCG, Beta Chain, Quant, S 3330 (*) <5 mIU/mL  WET PREP, GENITAL     Status: Abnormal   Collection Time    05/06/13  1:55 AM      Result Value Range   Yeast Wet Prep HPF POC MANY (*) NONE SEEN   Trich, Wet Prep NONE SEEN  NONE SEEN   Clue Cells Wet Prep HPF POC FEW (*) NONE SEEN   WBC, Wet Prep HPF POC FEW (*) NONE SEEN  AST     Status: None   Collection Time    05/06/13  4:55 AM      Result Value Range   AST 11  0 - 37 U/L  ALT     Status: None   Collection Time    05/06/13  4:55 AM      Result Value Range  ALT 9  0 - 35 U/L    IMAGING US Ob Comp Less 14 Wks  05/06/2013   *RADIOLOGY REPORT*  Clinical Data: Vaginal bleeding and pelvic pain.  Unknown LMP. Quantitative beta HCG 3330.  OBSTETRIC <14 WK [place] AND TRANSVAGINAL OB US  Technique:  Both transabdominal and transvaginal ultrasound examinations were performed for complete evaluation of the gestation as well as the maternal uterus, adnexal regions, and pelvic cul-de-sac.  Transvaginal technique was performed to assess early pregnancy.  Comparison:  None.  Intrauterine gestational sac:  No single intrauterine pregnancy is identified.  There is prominence of the endometrium consistent with  gestational reaction. Yolk sac: Not identified Embryo: Not identified Cardiac Activity: Not identified  Maternal uterus/adnexae: There is a heterogeneous appearing rounded mixed echotexture mass in the right adnexa medial to the right ovary measuring about 6.4 x 2.9 x 4 cm.  This has a heterogeneous appearance with central cystic collection.  Mild prominence of blood flow.  Moderate amount of free fluid in the area with some complexity.  No definitive fetal pole is demonstrated but the appearance is highly suspicious for ectopic pregnancy.  The right ovary measures 3.5 x 2.3 x 2.6 cm and contains normal follicular changes.  The left ovary measures 3.4 x 1.8 x 2.2 cm and contains normal follicular changes.  IMPRESSION: No intrauterine pregnancy identified.  Heterogeneous mass with central cystic structure in the right adnexa is suspicious for ectopic pregnancy.  Moderate free pelvic fluid.  Results were telephoned to [place], CNM, at 0430 hours on 05/06/2013.   Original Report Authenticated By: Burman Nieves, M.D.   US Ob Transvaginal  05/06/2013   *RADIOLOGY REPORT*  Clinical Data: Vaginal bleeding and pelvic pain.  Unknown LMP. Quantitative beta HCG 3330.  OBSTETRIC <14 WK [place] AND TRANSVAGINAL OB US  Technique:  Both transabdominal and transvaginal ultrasound examinations were performed for complete evaluation of the gestation as well as the maternal uterus, adnexal regions, and pelvic cul-de-sac.  Transvaginal technique was performed to assess early pregnancy.  Comparison:  None.  Intrauterine gestational sac:  No single intrauterine pregnancy is identified.  There is prominence of the endometrium consistent with gestational reaction. Yolk sac: Not identified Embryo: Not identified Cardiac Activity: Not identified  Maternal uterus/adnexae: There is a heterogeneous appearing rounded mixed echotexture mass in the right adnexa medial to the right ovary measuring about 6.4 x 2.9 x 4 cm.  This has a  heterogeneous appearance with central cystic collection.  Mild prominence of blood flow.  Moderate amount of free fluid in the area with some complexity.  No definitive fetal pole is demonstrated but the appearance is highly suspicious for ectopic pregnancy.  The right ovary measures 3.5 x 2.3 x 2.6 cm and contains normal follicular changes.  The left ovary measures 3.4 x 1.8 x 2.2 cm and contains normal follicular changes.  IMPRESSION: No intrauterine pregnancy identified.  Heterogeneous mass with central cystic structure in the right adnexa is suspicious for ectopic pregnancy.  Moderate free pelvic fluid.  Results were telephoned to [place], CNM, at 0430 hours on 05/06/2013.   Original Report Authenticated By: Burman Nieves, M.D.    MAU COURSE US shows right ectopic. Dr Despina Hidden reviewed [place] images and quant. He states pt is candidate for MTX.   ASSESSMENT 1. Ectopic pregnancy    PLAN Discharge home in stable condition. Ectopic precautions strictly reviewed. Pt informed that sometimes second dose MTX or surgery may be necessary.      Follow-up  Information   Follow up with THE [facility] OF [place] [facility]ERN[facility] ADMISSIONS On 05/09/2013. (for repeat blood work or sooner as needed if symptoms worsen)    Contact information:   [address] [id number] [place] [address] [phone]       Medication List    STOP taking these medications       esomeprazole 20 MG capsule  Commonly known as:  NEXIUM     famotidine 40 MG tablet  Commonly known as:  PEPCID     ibuprofen 200 MG tablet  Commonly known as:  ADVIL,MOTRIN      TAKE these medications       omeprazole 40 MG capsule  Commonly known as:  PRILOSEC  Take 1 capsule (40 mg total) by mouth daily.     oxyCODONE-acetaminophen 5-325 MG per tablet  Commonly known as:  PERCOCET/ROXICET  Take 1 tablet by mouth every 4 (four) hours as needed for pain.         [place], [place] 05/06/2013  6:33  AM

## 2013-05-06 NOTE — ED Notes (Signed)
[facility] leaving with pt at this time. ?

## 2013-05-06 NOTE — ED Notes (Signed)
Pt c/o diffuse abd pain since yesterday. Denies n/v/d.

## 2013-05-06 NOTE — MAU Note (Signed)
Patient waiting for ride who should pick her up around 7:00 a.m. Denies pain at this time, call bell within reach, side rails up for safety.

## 2013-05-09 ENCOUNTER — Inpatient Hospital Stay (HOSPITAL_COMMUNITY)
Admission: AD | Admit: 2013-05-09 | Discharge: 2013-05-09 | Disposition: A | Discharge status: Home / Self Care | Payer: 59 | Source: Ambulatory Visit | Attending: Obstetrics & Gynecology | Admitting: Obstetrics & Gynecology

## 2013-05-09 DIAGNOSIS — O00109 Unspecified tubal pregnancy without intrauterine pregnancy: Secondary | ICD-10-CM | POA: Insufficient documentation

## 2013-05-09 DIAGNOSIS — O009 Unspecified ectopic pregnancy without intrauterine pregnancy: Secondary | ICD-10-CM

## 2013-05-09 LAB — HCG, QUANTITATIVE, PREGNANCY: hCG, Beta Chain, Quant, S: 1790 m[IU]/mL — ABNORMAL HIGH (ref <5)

## 2013-05-09 LAB — CBC
Hemoglobin: 10.3 g/dL — ABNORMAL LOW (ref 12.0–15.0)
MCH: 26.1 pg (ref 26.0–34.0)
MCHC: 32.6 g/dL (ref 30.0–36.0)
Platelets: 188 10*3/uL (ref 150–400)
RBC: 3.95 MIL/uL (ref 3.87–5.11)

## 2013-05-09 NOTE — Discharge Instructions (Signed)
Methotrexate Treatment for an Ectopic Pregnancy An ectopic pregnancy is when the fertilized egg attaches (implants) outside the uterus. Most ectopic pregnancies occur in the fallopian tube. Rarely do ectopic pregnancies occur on the ovary, intestine, pelvis, or cervix. An ectopic pregnancy does not have the ability to develop into a normal, healthy baby. Having an ectopic pregnancy can be a life-threatening experience. However, if the ectopic pregnancy is found early enough, it can be treated with a medicine. This medicine is called methotrexate. Methotrexate works by stopping the pregnancy from growing. It helps the body absorb the pregnancy tissue over a 2 to 6 week period (though most pregnancies will be absorbed by 3 weeks).  If methotrexate is successful, there is a good chance that the fallopian tube may be saved. Regardless of whether the fallopian tube is saved, a mother who has had an ectopic pregnancy is at a much higher risk of having another ectopic occur in future pregnancies. One serious concern is the potential for the fallopian tube to tear (rupture). If it does, emergency surgery is needed to remove the pregnancy, and methotrexate cannot be used. The ideal patient for methotrexate is a person who is:   Not bleeding internally.  Has no severe or persistent abdominal pain.  Is committed to following through with lab tests and appointments until the ectopic has absorbed.  Is healthy and has normal liver and kidney functions on evaluation. Methotrexate should not be given to women who:  Are breastfeeding.  Have a normal pregnancy (intrauterine pregnancy).  Have liver, lung, or kidney disease.  Have blood problems.  Are allergic to methotrexate.  Have peptic ulcers.  Have an ectopic pregnancy larger than 1 inches (3.5 cm) or one that has fetal heartbeats. This is a rule that is followed most of the time (relative contraindication). BEFORE THE TREATMENT Before giving the  medicine:  Liver tests, kidney tests, and a complete blood test are performed.  Blood tests are performed to measure the pregnancy hormone levels and to determine the mother's blood type.  If the woman is Rh negative, and the father is Rh positive or his Rh type is not known, a RhoGAM shot is given. TREATMENT  There are 2 methods that your caregiver may use to prescribe methotrexate. One method involves a single dose or injection of the medicine. Another method involves a series of doses. This method involves several injections.  AFTER THE TREATMENT Blood tests will be taken for several weeks to check the pregnancy hormone levels. The blood tests are performed until there is no more pregnancy hormone detected in the blood. There is still a risk of the ectopic pregnancy rupturing while using the methotrexate. There are also side effects of methotrexate, which include:   Nausea and vomiting.  Mouth sores.  Diarrhea.  Rash.  Dizziness.  Increased abdominal pain.  Increased vaginal bleeding or spotting.  Pneumonia.  Failed treatment.  Hair loss. This is rare and reversible. On very rare occasions, the medicine may affect your blood counts, liver, kidney, bone marrow, or hormone levels. If this happens, your caregiver will want to perform further evaluations. Document Released: 10/08/2001 Document Revised: 01/06/2012 Document Reviewed: 06/20/2011 ExitCare Patient Information 2014 [employer].   

## 2013-05-09 NOTE — MAU Provider Note (Signed)
Joan Thomas is a 27 y.o. G1P0 at Unknown here for follow up quant HCG on day 4 s/p MTX. Vaginal bleeding: less flow than a normal period. Abdominal pain: pressure-like. Bleeding and pain are not worse than at initial visit.    Prior HCGs: 7/11: 3330  Prior U/S: 7/11: 6.5 cm right adnexal mass suspicious for ectopic pregnancy  Past Medical History  Diagnosis Date  . GERD (gastroesophageal reflux disease)     No prescriptions prior to admission    No Known Allergies  Review of Systems - Negative except as noted above  Objective BP 113/65  Pulse 77  Temp(Src) 98.7 F (37.1 C)  Resp 18  General: Alert, oriented, no acute distress  Results for orders placed during the hospital encounter of 05/09/13 (from the past 24 hour(s))  HCG, QUANTITATIVE, PREGNANCY     Status: Abnormal   Collection Time    05/09/13  2:06 PM      Result Value Range   hCG, Beta Chain, Quant, S 1790 (*) <5 mIU/mL  CBC     Status: Abnormal   Collection Time    05/09/13  2:06 PM      Result Value Range   WBC 3.2 (*) 4.0 - 10.5 K/uL   RBC 3.95  3.87 - 5.11 MIL/uL   Hemoglobin 10.3 (*) 12.0 - 15.0 g/dL   HCT 16.1 (*) 09.6 - 04.5 %   MCV 80.0  78.0 - 100.0 fL   MCH 26.1  26.0 - 34.0 pg   MCHC 32.6  30.0 - 36.0 g/dL   RDW 40.9  81.1 - 91.4 %   Platelets 188  150 - 400 K/uL    Assessment  1. Ectopic pregnancy   Good decrease in quant HCG  Plan F/U on day 7 for repeat quant, precautions rev'd, return sooner with increase in pain or bleeding     Medication List         omeprazole 40 MG capsule  Commonly known as:  PRILOSEC  Take 40 mg by mouth 2 (two) times daily.     oxyCODONE-acetaminophen 5-325 MG per tablet  Commonly known as:  PERCOCET/ROXICET  Take 1 tablet by mouth every 4 (four) hours as needed for pain.        Follow-up Information   Follow up with THE [facility] OF [place] [facility]ERNITY ADMISSIONS On 05/12/2013. (for repeat labs)    Contact information:   [address] [id number] [place] [address] [phone]      [facility] 3:16 PM 05/09/13

## 2013-05-12 ENCOUNTER — Inpatient Hospital Stay (HOSPITAL_COMMUNITY)
Admission: AD | Admit: 2013-05-12 | Discharge: 2013-05-12 | Disposition: A | Discharge status: Home / Self Care | Payer: 59 | Source: Ambulatory Visit | Attending: Obstetrics & Gynecology | Admitting: Obstetrics & Gynecology

## 2013-05-12 ENCOUNTER — Encounter (HOSPITAL_COMMUNITY): Payer: Self-pay | Admitting: *Deleted

## 2013-05-12 DIAGNOSIS — O009 Unspecified ectopic pregnancy without intrauterine pregnancy: Secondary | ICD-10-CM

## 2013-05-12 DIAGNOSIS — O00109 Unspecified tubal pregnancy without intrauterine pregnancy: Secondary | ICD-10-CM | POA: Insufficient documentation

## 2013-05-12 LAB — CBC WITH DIFFERENTIAL/PLATELET
Basophils Absolute: 0 10*3/uL (ref 0.0–0.1)
Basophils Relative: 1 % (ref 0–1)
HCT: 31.7 % — ABNORMAL LOW (ref 36.0–46.0)
Lymphocytes Relative: 41 % (ref 12–46)
MCHC: 32.5 g/dL (ref 30.0–36.0)
Neutro Abs: 2.1 10*3/uL (ref 1.7–7.7)
Neutrophils Relative %: 50 % (ref 43–77)
RDW: 15.3 % (ref 11.5–15.5)
WBC: 4.1 10*3/uL (ref 4.0–10.5)

## 2013-05-12 LAB — HCG, QUANTITATIVE, PREGNANCY: hCG, Beta Chain, Quant, S: 1751 m[IU]/mL — ABNORMAL HIGH (ref <5)

## 2013-05-12 LAB — CREATININE, SERUM
Creatinine, Ser: 0.77 mg/dL (ref 0.50–1.10)
GFR calc non Af Amer: >90 mL/min (ref >90)

## 2013-05-12 LAB — AST: AST: 16 U/L (ref 0–37)

## 2013-05-12 LAB — BUN: BUN: 4 mg/dL — ABNORMAL LOW (ref 6–23)

## 2013-05-12 MED ORDER — OXYCODONE-ACETAMINOPHEN 5-325 MG PO TABS: 1.0000 | ORAL_TABLET | ORAL | Status: DC | PRN

## 2013-05-12 MED ORDER — METHOTREXATE INJECTION FOR WOMEN'S HOSPITAL
50.0000 mg/m2 | Freq: Once | INTRAMUSCULAR | Status: AC
  Administered 2013-05-12: 90 mg via INTRAMUSCULAR
  Filled 2013-05-12: qty 1.8

## 2013-05-12 MED ORDER — OXYCODONE-ACETAMINOPHEN 5-325 MG PO TABS
1.0000 | ORAL_TABLET | Freq: Once | ORAL | Status: AC
  Administered 2013-05-12: 1 via ORAL
  Filled 2013-05-12: qty 1

## 2013-05-12 NOTE — MAU Provider Note (Signed)
Ms. Kaysia Willard is a 27 y.o. G1P0 at Unknown who presents to MAU today for follow-up quant hCG on day #7 s/p MTX. The patient denies pain or bleeding today. Patient's quant hCG on 05/06/13 was 3330 and on day #4 it was 1790.   BP 114/74  Pulse 68  Temp(Src) 98.7 F (37.1 C) (Oral)  Resp 18  Ht 5\' 4"  (1.626 m)  Wt 165 lb (74.844 kg)  BMI 28.31 kg/m2  SpO2 100% GENERAL: Well-developed, well-nourished female in no acute distress.  HEENT: Normocephalic, atraumatic. Sclerae anicteric.  LUNGS: Normal effort HEART: Regular rate  EXTREMITIES: No cyanosis, clubbing, or edema SKIN: Warm, dry and without erythema PSYCH: Normal mood and affect  Results for orders placed during the hospital encounter of 05/12/13 (from the past 24 hour(s))  HCG, QUANTITATIVE, PREGNANCY     Status: Abnormal   Collection Time    05/12/13 11:46 AM      Result Value Range   hCG, Beta Chain, Quant, S 1751 (*) <5 mIU/mL  CBC WITH DIFFERENTIAL     Status: Abnormal   Collection Time    05/12/13  1:35 PM      Result Value Range   WBC 4.1  4.0 - 10.5 K/uL   RBC 3.94  3.87 - 5.11 MIL/uL   Hemoglobin 10.3 (*) 12.0 - 15.0 g/dL   HCT 25.3 (*) 66.4 - 40.3 %   MCV 80.5  78.0 - 100.0 fL   MCH 26.1  26.0 - 34.0 pg   MCHC 32.5  30.0 - 36.0 g/dL   RDW 47.4  25.9 - 56.3 %   Platelets 218  150 - 400 K/uL   Neutrophils Relative % 50  43 - 77 %   Neutro Abs 2.1  1.7 - 7.7 K/uL   Lymphocytes Relative 41  12 - 46 %   Lymphs Abs 1.7  0.7 - 4.0 K/uL   Monocytes Relative 8  3 - 12 %   Monocytes Absolute 0.3  0.1 - 1.0 K/uL   Eosinophils Relative 1  0 - 5 %   Eosinophils Absolute 0.0  0.0 - 0.7 K/uL   Basophils Relative 1  0 - 1 %   Basophils Absolute 0.0  0.0 - 0.1 K/uL  AST     Status: None   Collection Time    05/12/13  1:35 PM      Result Value Range   AST 16  0 - 37 U/L  BUN     Status: Abnormal   Collection Time    05/12/13  1:35 PM      Result Value Range   BUN 4 (*) 6 - 23 mg/dL  CREATININE, SERUM      Status: None   Collection Time    05/12/13  1:35 PM      Result Value Range   Creatinine, Ser 0.77  0.50 - 1.10 mg/dL   GFR calc non Af Amer >90  >90 mL/min   GFR calc Af Amer >90  >90 mL/min    MDM Discussed patient's quant hCG results with Dr. Debroah Loop. Patient needs additional dose of MTX. Labs ordered.  Patient began having some pain while in MAU. #1 Percocet given. Patient does not request additional pain medication.   A: Ectopic pregnancy s/p MTX with inappropriate drop in quant hCG  P: Discharge home Bleeding precautions discussed Patient advised to follow-up in MAU on Saturday for repeat quant hCG Patient may return to MAU sooner as needed or if her  condition were to change or worsen  Freddi Starr, PA-C 05/12/2013 4:22 PM

## 2013-05-12 NOTE — MAU Note (Signed)
Patient to MAU for BHCG day 7 s/p MTX. Patient denies pain or bleeding.

## 2013-05-15 ENCOUNTER — Inpatient Hospital Stay (HOSPITAL_COMMUNITY)
Admission: AD | Admit: 2013-05-15 | Discharge: 2013-05-15 | Disposition: A | Discharge status: Home / Self Care | Payer: 59 | Source: Ambulatory Visit | Attending: Obstetrics and Gynecology | Admitting: Obstetrics and Gynecology

## 2013-05-15 DIAGNOSIS — O009 Unspecified ectopic pregnancy without intrauterine pregnancy: Secondary | ICD-10-CM

## 2013-05-15 DIAGNOSIS — O00109 Unspecified tubal pregnancy without intrauterine pregnancy: Secondary | ICD-10-CM | POA: Insufficient documentation

## 2013-05-15 NOTE — MAU Provider Note (Signed)
Attestation of Attending Supervision of Advanced Practitioner (CNM/NP): Evaluation and management procedures were performed by the Advanced Practitioner under my supervision and collaboration.  I have reviewed the Advanced Practitioner's note and chart, and I agree with the management and plan.  Skarlette Lattner 05/15/2013 2:04 PM   

## 2013-05-15 NOTE — MAU Note (Signed)
Ms. Joan Thomas is here for a follow up beta Hcg. She is still having small amounts of bleeding but feels like her symptoms are the same and have not worsened.

## 2013-05-15 NOTE — MAU Provider Note (Signed)
Ms. Joan Thomas is a 27 y.o. G1P0 at Unknown who presents to MAU today for follow-up quant hCG on day #4 s/p MTX. The patient is having minimal bleeding. Denies change in symptoms. The patient had second dose of MTX on Wednesday after quant hCG did not drop appropriately. Quant hCG on Wednesday was 1751.   BP 114/59  Pulse 68  Temp(Src) 98.5 F (36.9 C) (Oral)  Resp 18 GENERAL: Well-developed, well-nourished female in no acute distress.  HEENT: Normocephalic, atraumatic.   LUNGS: Effort normal HEART: Regular rate  SKIN: Warm, dry and without erythema PSYCH: Normal mood and affect  Results for orders placed during the hospital encounter of 05/15/13 (from the past 24 hour(s))  HCG, QUANTITATIVE, PREGNANCY     Status: Abnormal   Collection Time    05/15/13 12:32 PM      Result Value Range   hCG, Beta Chain, Quant, S 1328 (*) <5 mIU/mL    A: Ectopic pregnancy s/p MTX  P: Discharge home Bleeding precautions discussed Patient to return on Tuesday for day #7 quant hCG follow-up  Patient may return to MAU sooner if symptoms were to change or worsen  Freddi Starr, PA-C 05/15/2013 1:19 PM

## 2013-05-17 ENCOUNTER — Telehealth (HOSPITAL_COMMUNITY): Payer: Self-pay

## 2013-05-18 ENCOUNTER — Inpatient Hospital Stay (HOSPITAL_COMMUNITY)
Admission: AD | Admit: 2013-05-18 | Discharge: 2013-05-18 | Disposition: A | Discharge status: Home / Self Care | Payer: 59 | Source: Ambulatory Visit | Attending: Obstetrics & Gynecology | Admitting: Obstetrics & Gynecology

## 2013-05-18 DIAGNOSIS — O00109 Unspecified tubal pregnancy without intrauterine pregnancy: Secondary | ICD-10-CM | POA: Insufficient documentation

## 2013-05-18 DIAGNOSIS — O009 Unspecified ectopic pregnancy without intrauterine pregnancy: Secondary | ICD-10-CM

## 2013-05-18 MED ORDER — OXYCODONE-ACETAMINOPHEN 5-325 MG PO TABS: 1.0000 | ORAL_TABLET | ORAL | Status: DC | PRN

## 2013-05-18 NOTE — MAU Note (Signed)
Patient to MAU for repeat BHCG. Patient states she continues to have some pain off and on and light bleeding.

## 2013-05-18 NOTE — MAU Provider Note (Signed)
S: 27 y.o. G1P0 returns on Day 7 of second dose of methotrexate for repeat quant hcg.  She reports mild cramping and vaginal spotting, unchanged in several days.  She denies n/v or fever/chills.    O: BP 123/60  Pulse 77  Temp(Src) 98.2 F (36.8 C) (Oral)  SpO2 100%   Quant hcg: Day 1:  1751  Day 4:  1328  Day 7: Results for orders placed during the hospital encounter of 05/18/13 (from the past 24 hour(s))  HCG, QUANTITATIVE, PREGNANCY     Status: Abnormal   Collection Time    05/18/13  4:02 PM      Result Value Range   hCG, Beta Chain, Quant, S 812 (*) <5 mIU/mL   A: 1. Ectopic pregnancy     P: D/C home with ectopic precautions Return in 1 week for f/u labs or sooner as needed  Sharen Counter Certified Nurse-Midwife

## 2013-05-25 ENCOUNTER — Inpatient Hospital Stay (HOSPITAL_COMMUNITY)
Admission: AD | Admit: 2013-05-25 | Discharge: 2013-05-25 | Disposition: A | Discharge status: Home / Self Care | Payer: 59 | Source: Ambulatory Visit | Attending: Obstetrics and Gynecology | Admitting: Obstetrics and Gynecology

## 2013-05-25 ENCOUNTER — Encounter (HOSPITAL_COMMUNITY): Payer: Self-pay

## 2013-05-25 DIAGNOSIS — O00109 Unspecified tubal pregnancy without intrauterine pregnancy: Secondary | ICD-10-CM | POA: Insufficient documentation

## 2013-05-25 DIAGNOSIS — O009 Unspecified ectopic pregnancy without intrauterine pregnancy: Secondary | ICD-10-CM

## 2013-05-25 NOTE — MAU Note (Signed)
Patient to MAU for weekly BHCG s/p secibd dose of MTX. Patient states she continues to have light spotting and pain, sometime mild other times stronger.

## 2013-05-25 NOTE — MAU Provider Note (Signed)
Ms. Joan Thomas is a 27 y.o. G1P0 who is s/p MTX #2 who presents to MAU today for follow-up quant hCG. The patient continues to have mild bleeding and occasional lower abdominal pain. She denies fever.   BP 111/72  Pulse 70  Temp(Src) 98.2 F (36.8 C) (Oral)  Resp 16  SpO2 100% GENERAL: Well-developed, well-nourished female in no acute distress.  HEENT: Normocephalic, atraumatic.   LUNGS: Effort normal HEART: Regular rate  SKIN: Warm, dry and without erythema PSYCH: Normal mood and affect  Results for orders placed during the hospital encounter of 05/25/13 (from the past 24 hour(s))  HCG, QUANTITATIVE, PREGNANCY     Status: Abnormal   Collection Time    05/25/13  3:27 PM      Result Value Range   hCG, Beta Chain, Quant, S 504 (*) <5 mIU/mL    MDM Quant hCG continues to fall. Patient can follow-up in GYN clinic for weekly hCG levels to zero  A: Ectopic pregnancy s/p MTX x 2 with falling quant hCG  P: Discharge home Ectopic precautions discussed Patient referred to [facility] clinic for weekly quant hCGs and follow-up Patient may return to MAU as needed or if her condition were to change or worsen  Freddi Starr, PA-C 05/25/2013 4:39 PM

## 2013-05-31 NOTE — MAU Provider Note (Signed)
Attestation of Attending Supervision of Advanced Practitioner (CNM/NP): Evaluation and management procedures were performed by the Advanced Practitioner under my supervision and collaboration.  I have reviewed the Advanced Practitioner's note and chart, and I agree with the management and plan.  Avaleen Brownley 05/31/2013 2:04 PM

## 2013-06-02 ENCOUNTER — Other Ambulatory Visit: Payer: 59

## 2013-06-02 DIAGNOSIS — O009 Unspecified ectopic pregnancy without intrauterine pregnancy: Secondary | ICD-10-CM

## 2013-06-03 ENCOUNTER — Telehealth: Payer: Self-pay | Admitting: General Practice

## 2013-06-03 NOTE — Telephone Encounter (Signed)
Called patient and informed her of results and asked if she could come in next Wednesday for another blood draw to continue to monitor the levels. Patient verbalized understanding and stated she could come in around 3 next Wednesday the 13th. Patient had no further questions

## 2013-06-03 NOTE — Telephone Encounter (Signed)
Message copied by Kathee Delton on Thu Jun 03, 2013  4:16 PM ------      Message from: Freddi Starr      Created: Thu Jun 03, 2013  1:38 PM       Patient will need follow-up labs 1 week from last lab draw for quant hCG. Please schedule and let patient know. Thanks! ------

## 2013-06-09 ENCOUNTER — Other Ambulatory Visit: Payer: 59

## 2013-06-09 DIAGNOSIS — O009 Unspecified ectopic pregnancy without intrauterine pregnancy: Secondary | ICD-10-CM

## 2013-06-10 LAB — HCG, QUANTITATIVE, PREGNANCY: hCG, Beta Chain, Quant, S: 12.3 m[IU]/mL

## 2013-06-11 ENCOUNTER — Telehealth: Payer: Self-pay | Admitting: *Deleted

## 2013-06-11 NOTE — Telephone Encounter (Signed)
Pt left message requesting lab results and wants to know if additional appts are necessary.

## 2013-06-15 ENCOUNTER — Telehealth: Payer: Self-pay | Admitting: General Practice

## 2013-06-15 NOTE — Telephone Encounter (Signed)
Patient called in to front office requesting lab results and wanted to know if she needs to come back for another blood draw. Informed patient of hcg levels continuing to drop appropriately so she will not need another lab draw. Patient verbalized understanding and had no further questions

## 2013-06-15 NOTE — Telephone Encounter (Signed)
Patient informed under a different encounter

## 2013-07-07 ENCOUNTER — Ambulatory Visit (INDEPENDENT_AMBULATORY_CARE_PROVIDER_SITE_OTHER): Payer: 59 | Admitting: Obstetrics & Gynecology

## 2013-07-07 ENCOUNTER — Encounter: Payer: Self-pay | Admitting: Family Medicine

## 2013-07-07 VITALS — BP 110/65 | HR 78 | Temp 98.0°F | Ht 64.0 in | Wt 151.0 lb

## 2013-07-07 DIAGNOSIS — O039 Complete or unspecified spontaneous abortion without complication: Secondary | ICD-10-CM

## 2013-07-07 NOTE — Patient Instructions (Addendum)
Miscarriage A miscarriage is the sudden loss of an unborn baby (fetus) before the 20th week of pregnancy. Most miscarriages happen in the first 3 months of pregnancy. Sometimes, it happens before a woman even knows she is pregnant. A miscarriage is also called a "spontaneous miscarriage" or "early pregnancy loss." Having a miscarriage can be an emotional experience. Talk with your caregiver about any questions you may have about miscarrying, the grieving process, and your future pregnancy plans. CAUSES   Problems with the fetal chromosomes that make it impossible for the baby to develop normally. Problems with the baby's genes or chromosomes are most often the result of errors that occur, by chance, as the embryo divides and grows. The problems are not inherited from the parents.  Infection of the cervix or uterus.   Hormone problems.   Problems with the cervix, such as having an incompetent cervix. This is when the tissue in the cervix is not strong enough to hold the pregnancy.   Problems with the uterus, such as an abnormally shaped uterus, uterine fibroids, or congenital abnormalities.   Certain medical conditions.   Smoking, drinking alcohol, or taking illegal drugs.   Trauma.  Often, the cause of a miscarriage is unknown.  SYMPTOMS   Vaginal bleeding or spotting, with or without cramps or pain.  Pain or cramping in the abdomen or lower back.  Passing fluid, tissue, or blood clots from the vagina. DIAGNOSIS  Your caregiver will perform a physical exam. You may also have an ultrasound to confirm the miscarriage. Blood or urine tests may also be ordered. TREATMENT   Sometimes, treatment is not necessary if you naturally pass all the fetal tissue that was in the uterus. If some of the fetus or placenta remains in the body (incomplete miscarriage), tissue left behind may become infected and must be removed. Usually, a dilation and curettage (D and C) procedure is performed.  During a D and C procedure, the cervix is widened (dilated) and any remaining fetal or placental tissue is gently removed from the uterus.  Antibiotic medicines are prescribed if there is an infection. Other medicines may be given to reduce the size of the uterus (contract) if there is a lot of bleeding.  If you have Rh negative blood and your baby was Rh positive, you will need a Rh immunoglobulin shot. This shot will protect any future baby from having Rh blood problems in future pregnancies. HOME CARE INSTRUCTIONS   Your caregiver may order bed rest or may allow you to continue light activity. Resume activity as directed by your caregiver.  Have someone help with home and family responsibilities during this time.   Keep track of the number of sanitary pads you use each day and how soaked (saturated) they are. Write down this information.   Do not use tampons. Do not douche or have sexual intercourse until approved by your caregiver.   Only take over-the-counter or prescription medicines for pain or discomfort as directed by your caregiver.   Do not take aspirin. Aspirin can cause bleeding.   Keep all follow-up appointments with your caregiver.   If you or your partner have problems with grieving, talk to your caregiver or seek counseling to help cope with the pregnancy loss. Allow enough time to grieve before trying to get pregnant again.  SEEK IMMEDIATE MEDICAL CARE IF:   You have severe cramps or pain in your back or abdomen.  You have a fever.  You pass large blood clots (walnut-sized   or larger) ortissue from your vagina. Save any tissue for your caregiver to inspect.   Your bleeding increases.   You have a thick, bad-smelling vaginal discharge.  You become lightheaded, weak, or you faint.   You have chills.  MAKE SURE YOU:  Understand these instructions.  Will watch your condition.  Will get help right away if you are not doing well or get  worse. Document Released: 04/09/2001 Document Revised: 04/14/2012 Document Reviewed: 12/03/2011 [employer] Patient Information 2014 [employer].  

## 2013-07-07 NOTE — Progress Notes (Signed)
Subjective:     Patient ID: Joan Thomas, female   DOB: October 04, 1986, 27 y.o.   MRN: [id number]  HPI  Pt is s/p an SAB in July.  She is having no sx currently.  She does not want contraception but, does not necessarily want to be pregnant now.  Has hadher reg menses since the SAB.   Review of Systems     Objective:   Physical Exam BP 110/65  Pulse 78  Temp(Src) 98 F (36.7 C) (Oral)  Ht 5\' 4"  (1.626 m)  Wt 151 lb (68.493 kg)  BMI 25.91 kg/m2  LMP 06/29/2013  Breastfeeding? No Abd: soft; NT; ND  Gyn: deferred    Assessment:     Pt s/p SAB in July.  No problems  Contraception counseling- pt declines     Plan:     F/u prn rec f/u for contraception if she decides to use something

## 2013-07-29 ENCOUNTER — Ambulatory Visit: Payer: 59 | Admitting: Obstetrics & Gynecology

## 2013-09-02 ENCOUNTER — Ambulatory Visit: Payer: 59 | Admitting: Family Medicine

## 2014-08-29 ENCOUNTER — Encounter: Payer: Self-pay | Admitting: Family Medicine

## 2017-01-17 ENCOUNTER — Emergency Department (HOSPITAL_BASED_OUTPATIENT_CLINIC_OR_DEPARTMENT_OTHER): Payer: 59

## 2017-01-17 ENCOUNTER — Emergency Department (HOSPITAL_BASED_OUTPATIENT_CLINIC_OR_DEPARTMENT_OTHER)
Admission: EM | Admit: 2017-01-17 | Discharge: 2017-01-17 | Disposition: A | Discharge status: Home / Self Care | Payer: 59 | Attending: Emergency Medicine | Admitting: Emergency Medicine

## 2017-01-17 ENCOUNTER — Encounter (HOSPITAL_BASED_OUTPATIENT_CLINIC_OR_DEPARTMENT_OTHER): Payer: Self-pay | Admitting: Emergency Medicine

## 2017-01-17 DIAGNOSIS — N3000 Acute cystitis without hematuria: Secondary | ICD-10-CM | POA: Insufficient documentation

## 2017-01-17 DIAGNOSIS — R1010 Upper abdominal pain, unspecified: Secondary | ICD-10-CM | POA: Diagnosis present

## 2017-01-17 DIAGNOSIS — F17290 Nicotine dependence, other tobacco product, uncomplicated: Secondary | ICD-10-CM | POA: Insufficient documentation

## 2017-01-17 LAB — URINALYSIS, ROUTINE W REFLEX MICROSCOPIC
Bilirubin Urine: NEGATIVE
GLUCOSE, UA: NEGATIVE mg/dL
Hgb urine dipstick: NEGATIVE
KETONES UR: 15 mg/dL — AB
LEUKOCYTES UA: NEGATIVE
NITRITE: POSITIVE — AB
PROTEIN: NEGATIVE mg/dL
Specific Gravity, Urine: 1.03 (ref 1.005–1.030)
pH: 7 (ref 5.0–8.0)

## 2017-01-17 LAB — URINALYSIS, MICROSCOPIC (REFLEX): RBC / HPF: NONE SEEN RBC/hpf (ref 0–5)

## 2017-01-17 LAB — PREGNANCY, URINE: PREG TEST UR: NEGATIVE

## 2017-01-17 MED ORDER — DICYCLOMINE HCL 10 MG PO CAPS
20.0000 mg | ORAL_CAPSULE | Freq: Once | ORAL | Status: AC
  Administered 2017-01-17: 20 mg via ORAL
  Filled 2017-01-17: qty 2

## 2017-01-17 MED ORDER — CEPHALEXIN 500 MG PO CAPS: 500.0000 mg | ORAL_CAPSULE | Freq: Four times a day (QID) | ORAL | 0 refills | Status: DC

## 2017-01-17 NOTE — ED Provider Notes (Signed)
[facility]-EMERGENCY DEPT Provider Note   CSN: [id number] Arrival date & time: 01/17/17  1459  By signing my name below, I, Linna Darner, attest that this documentation has been prepared under the direction and in the presence of Janene Yousuf, PA-C. Electronically Signed: Linna Darner, Scribe. 01/17/2017. 4:30 PM.  History   Chief Complaint Chief Complaint  Patient presents with  . Abdominal Pain    The history is provided by the patient. No language interpreter was used.     HPI Comments: Joan Thomas is a 31 y.o. female with PMHx of GERD who presents to the Emergency Department complaining of persistent, sharp, cramping, lower abdominal pain beginning 6 days ago. No associated symptoms noted. She states she was awakened from her sleep 6 days ago with severe upper abdominal pain and was taken to [facility] ED via EMS. Pt was prescribed Bentyl and Pepsid but has not filled either of these medications. Pt was also offered a CT scan in the [place] ED but refused, and states she is here today for imaging of her abdomen.   She notes her abdominal pain has persisted and radiates throughout her abdomen, but she reports minor dull lower abdominal pain currently. She states her pain is exacerbated by most positions but is somewhat improved by sitting upright. Pt has tried Gas-X and ibuprofen with minimal improvement of her abdominal pain. She notes her stools have been smaller and harder prior to onset of her abdominal pain. Her last BM was yesterday. She has been eating, drinking, and urinating normally. LNMP 2/26 and she has not yet started her menstrual period this month. Pt denies nausea, vomiting, hematochezia, fevers, abnormal vaginal bleeding or discharge, or any other associated symptoms. She has a follow-up appointment with her PCP in three days.    Past Medical History:  Diagnosis Date  . GERD (gastroesophageal reflux disease)     There are no active problems to display for this  patient.   History reviewed. No pertinent surgical history.  OB History    Gravida Para Term Preterm AB Living   1             SAB TAB Ectopic Multiple Live Births                   Home Medications    Prior to Admission medications   Medication Sig Start Date End Date Taking? Authorizing Provider  cephALEXin (KEFLEX) 500 MG capsule Take 1 capsule (500 mg total) by mouth 4 (four) times daily. 01/17/17   Lakeisha Waldrop C Osama Coleson, PA-C  omeprazole (PRILOSEC) 40 MG capsule Take 40 mg by mouth 2 (two) times daily.    Historical Provider, MD  oxyCODONE-acetaminophen (PERCOCET/ROXICET) 5-325 MG per tablet Take 1 tablet by mouth every 4 (four) hours as needed for pain. 05/18/13   Hurshel Party, CNM    Family History Family History  Problem Relation Age of Onset  . Alcohol abuse Neg Hx   . Arthritis Neg Hx   . Asthma Neg Hx   . Birth defects Neg Hx   . Cancer Neg Hx   . COPD Neg Hx   . Depression Neg Hx   . Diabetes Neg Hx   . Drug abuse Neg Hx   . Early death Neg Hx   . Hearing loss Neg Hx   . Heart disease Neg Hx   . Hyperlipidemia Neg Hx   . Hypertension Neg Hx   . Kidney disease Neg Hx   . Learning disabilities Neg  Hx   . Mental illness Neg Hx   . Mental retardation Neg Hx   . Miscarriages / Stillbirths Neg Hx   . Stroke Neg Hx   . Vision loss Neg Hx     Social History Social History  Substance Use Topics  . Smoking status: Current Every Day Smoker    Packs/day: 0.50    Types: Cigars  . Smokeless tobacco: Never Used  . Alcohol use Yes     Allergies   Patient has no known allergies.   Review of Systems Review of Systems  Constitutional: Negative for fever.  Gastrointestinal: Positive for abdominal pain. Negative for blood in stool, nausea and vomiting.  Genitourinary: Negative for difficulty urinating, hematuria, vaginal bleeding and vaginal discharge.  Musculoskeletal: Negative for back pain.  All other systems reviewed and are negative.  Physical  Exam Updated Vital Signs BP 125/69 (BP Location: Right Arm)   Pulse 65   Temp 98.5 F (36.9 C) (Oral)   Resp 18   Ht 5\' 4"  (1.626 m)   Wt 183 lb (83 kg)   SpO2 100%   BMI 31.41 kg/m   Physical Exam  Constitutional: She appears well-developed and well-nourished. No distress.  HENT:  Head: Normocephalic and atraumatic.  Eyes: Conjunctivae are normal.  Neck: Neck supple.  Cardiovascular: Normal rate, regular rhythm, normal heart sounds and intact distal pulses.   Pulmonary/Chest: Effort normal and breath sounds normal. No respiratory distress.  Abdominal: Soft. There is tenderness. There is no guarding.  Verbally indicates LLQ and suprapubic tenderness but no grimace on exam.  Musculoskeletal: She exhibits no edema.  Lymphadenopathy:    She has no cervical adenopathy.  Neurological: She is alert.  Skin: Skin is warm and dry. She is not diaphoretic.  Psychiatric: She has a normal mood and affect. Her behavior is normal.  Nursing note and vitals reviewed.  ED Treatments / Results  Labs (all labs ordered are listed, but only abnormal results are displayed) Labs Reviewed  URINE CULTURE - Abnormal; Notable for the following:       Result Value   Culture   (*)    Value: >=100,000 COLONIES/mL ESCHERICHIA COLI SUSCEPTIBILITIES TO FOLLOW Performed at [facility], [address] [place], [place] [address]    All other components within normal limits  URINALYSIS, ROUTINE W REFLEX MICROSCOPIC - Abnormal; Notable for the following:    Ketones, ur 15 (*)    Nitrite POSITIVE (*)    All other components within normal limits  URINALYSIS, MICROSCOPIC (REFLEX) - Abnormal; Notable for the following:    Bacteria, UA MANY (*)    Squamous Epithelial / LPF 6-30 (*)    All other components within normal limits  PREGNANCY, URINE    EKG  EKG Interpretation None       Radiology Dg Abdomen Acute W/chest  Result Date: 01/17/2017 CLINICAL DATA:  Acute generalized abdominal pain.  EXAM: DG ABDOMEN ACUTE W/ 1V CHEST COMPARISON:  Radiograph of July 30, 2011. FINDINGS: There is no evidence of dilated bowel loops or free intraperitoneal air. No radiopaque calculi or other significant radiographic abnormality is seen. Heart size and mediastinal contours are within normal limits. Both lungs are clear. IMPRESSION: No evidence of bowel obstruction or ileus. No acute cardiopulmonary disease. Electronically Signed   By: Lupita Raider, M.D.   On: 01/17/2017 17:36    Procedures Procedures (including critical care time)  DIAGNOSTIC STUDIES: Oxygen Saturation is 100% on RA, normal by my interpretation.  COORDINATION OF CARE: 4:39 PM Discussed treatment plan with pt at bedside and pt agreed to plan.  Medications Ordered in ED Medications  dicyclomine (BENTYL) capsule 20 mg (20 mg Oral Given 01/17/17 1657)     Initial Impression / Assessment and Plan / ED Course  I have reviewed the triage vital signs and the nursing notes.  Pertinent labs & imaging results that were available during my care of the patient were reviewed by me and considered in my medical decision making (see chart for details).    Patient presents with abdominal pain that I believe maybe from 2 different sources. I suspect the original discomfort may be due to constipation. No signs of bowel obstruction on x-ray. Patient  Also has evidence of UTI on UA. Both of these issues were addressed with the patient. Patient has close follow-up already scheduled. The patient was given instructions for home care as well as return precautions. Patient voices understanding of these instructions, accepts the plan, and is comfortable with discharge.  4:44 PM: Chart review shows that patient was [facility] ED [facility]18 for sharp upper abdominal pain that awoke her from sleep. She was prescribed Bentyl and Pepsid and has not filled them. She was also offered a CT scan but declined. She had a negative urine pregnancy test.  Hemoglobin was 10.4 and CMP was grossly normal. She had bacteria with no squamous epithelials on the UA that time.    Final Clinical Impressions(s) / ED Diagnoses   Final diagnoses:  Acute cystitis without hematuria    New Prescriptions Discharge Medication List as of 01/17/2017  5:46 PM    START taking these medications   Details  cephALEXin (KEFLEX) 500 MG capsule Take 1 capsule (500 mg total) by mouth 4 (four) times daily., Starting Fri 01/17/2017, Print       I personally performed the services described in this documentation, which was scribed in my presence. The recorded information has been reviewed and is accurate.   Anselm PancoastShawn C Jerlyn Pain, PA-C 01/19/17 0700    Geoffery Lyonsouglas Delo, MD 01/19/17 1504

## 2017-01-17 NOTE — ED Notes (Signed)
States she does not know if she will be able to wait for xray if it will take much longer.

## 2017-01-17 NOTE — ED Notes (Addendum)
Patient states on Sunday she was having severe abd pain, was seen at a local ER. Blood work, and urine sample collected. Patient c/o gas pain that is not improving with Gas X. Patient was prescribed Bentyl and Pepcid but has not yet filled them. Patient reports irregular bowel habits. Patient states the pain is constant and achy, with episodes of stabbing pains. Denies emesis or diarrhea, no fever. Patient states she is eating and drinking without problems.

## 2017-01-17 NOTE — Discharge Instructions (Signed)
There is evidence of a urinary tract infection on the urinalysis today. There is no evidence of abnormality on the abdominal x-ray. Please take all of your antibiotics until finished!   You may develop abdominal discomfort or diarrhea from the antibiotic.  You may help offset this with probiotics which you can buy or get in yogurt. Do not eat or take the probiotics until 2 hours after your antibiotic.   May take ibuprofen, naproxen, or Tylenol for abdominal discomfort. Be sure to drink plenty of fluids to stay well hydrated. This means drinking at least half a liter of water an hour.  Follow-up with your primary care provider, as planned.

## 2017-01-17 NOTE — ED Triage Notes (Signed)
Patient states that she went to another hospital er on Saturday because of sharp pains to her generalized abominable area. The patient reports that she was offered an U/s and did not want it then. She would like one now since she continues to have the pain. Patient denies any pain at this time

## 2017-01-19 LAB — URINE CULTURE: Culture: >=100000 — AB

## 2017-01-20 ENCOUNTER — Telehealth: Payer: Self-pay | Admitting: *Deleted

## 2017-01-20 NOTE — Telephone Encounter (Signed)
Post ED Visit - Positive Culture Follow-up  Culture report reviewed by antimicrobial stewardship pharmacist:  [] Nathan Batchelder, Pharm.D. [] Jeremy Frens, Pharm.D., BCPS AQ-ID [] Mike Maccia, Pharm.D., BCPS [x] Elizabeth Martin, Pharm.D., BCPS [] Minh Pham, Pharm.D., BCPS, AAHIVP [] Michelle Turner, Pharm.D., BCPS, AAHIVP [] Rachel Rumbarger, PharmD, BCPS [] Taylor Stone, PharmD, BCPS [] Andy Johnston, PharmD, BCPS  Positive urine culture Treated with cephalexin, organism sensitive to the same and no further patient follow-up is required at this time.  Joan Thomas 01/20/2017, 9:43 AM   

## 2017-03-26 ENCOUNTER — Encounter (HOSPITAL_BASED_OUTPATIENT_CLINIC_OR_DEPARTMENT_OTHER): Payer: Self-pay

## 2017-03-26 ENCOUNTER — Emergency Department (HOSPITAL_BASED_OUTPATIENT_CLINIC_OR_DEPARTMENT_OTHER): Payer: 59

## 2017-03-26 ENCOUNTER — Emergency Department (HOSPITAL_BASED_OUTPATIENT_CLINIC_OR_DEPARTMENT_OTHER)
Admission: EM | Admit: 2017-03-26 | Discharge: 2017-03-26 | Disposition: A | Discharge status: Home / Self Care | Payer: 59 | Attending: Emergency Medicine | Admitting: Emergency Medicine

## 2017-03-26 DIAGNOSIS — R079 Chest pain, unspecified: Secondary | ICD-10-CM | POA: Diagnosis present

## 2017-03-26 DIAGNOSIS — K2100 Gastro-esophageal reflux disease with esophagitis, without bleeding: Secondary | ICD-10-CM

## 2017-03-26 DIAGNOSIS — F17200 Nicotine dependence, unspecified, uncomplicated: Secondary | ICD-10-CM | POA: Insufficient documentation

## 2017-03-26 DIAGNOSIS — R0789 Other chest pain: Secondary | ICD-10-CM | POA: Diagnosis not present

## 2017-03-26 LAB — BASIC METABOLIC PANEL
Anion gap: 5 (ref 5–15)
BUN: 7 mg/dL (ref 6–20)
CALCIUM: 8.7 mg/dL — AB (ref 8.9–10.3)
CHLORIDE: 105 mmol/L (ref 101–111)
CO2: 25 mmol/L (ref 22–32)
CREATININE: 0.77 mg/dL (ref 0.44–1.00)
GFR calc Af Amer: >60 mL/min (ref >60)
GFR calc non Af Amer: >60 mL/min (ref >60)
Glucose, Bld: 107 mg/dL — ABNORMAL HIGH (ref 65–99)
Potassium: 3.9 mmol/L (ref 3.5–5.1)
Sodium: 135 mmol/L (ref 135–145)

## 2017-03-26 LAB — CBC WITH DIFFERENTIAL/PLATELET
BASOS PCT: 0 %
Basophils Absolute: 0 10*3/uL (ref 0.0–0.1)
EOS ABS: 0.1 10*3/uL (ref 0.0–0.7)
Eosinophils Relative: 1 %
HEMATOCRIT: 31.6 % — AB (ref 36.0–46.0)
HEMOGLOBIN: 10.3 g/dL — AB (ref 12.0–15.0)
LYMPHS ABS: 1.7 10*3/uL (ref 0.7–4.0)
Lymphocytes Relative: 35 %
MCH: 24.1 pg — AB (ref 26.0–34.0)
MCHC: 32.6 g/dL (ref 30.0–36.0)
MCV: 74 fL — ABNORMAL LOW (ref 78.0–100.0)
MONOS PCT: 9 %
Monocytes Absolute: 0.4 10*3/uL (ref 0.1–1.0)
NEUTROS ABS: 2.6 10*3/uL (ref 1.7–7.7)
NEUTROS PCT: 55 %
Platelets: 260 10*3/uL (ref 150–400)
RBC: 4.27 MIL/uL (ref 3.87–5.11)
RDW: 17.4 % — AB (ref 11.5–15.5)
WBC: 4.8 10*3/uL (ref 4.0–10.5)

## 2017-03-26 LAB — HEPATIC FUNCTION PANEL
ALK PHOS: 57 U/L (ref 38–126)
ALT: 14 U/L (ref 14–54)
AST: 22 U/L (ref 15–41)
Albumin: 3.7 g/dL (ref 3.5–5.0)
BILIRUBIN TOTAL: 0.6 mg/dL (ref 0.3–1.2)
Bilirubin, Direct: <0.1 mg/dL — ABNORMAL LOW (ref 0.1–0.5)
Total Protein: 7 g/dL (ref 6.5–8.1)

## 2017-03-26 LAB — TROPONIN I: Troponin I: <0.03 ng/mL (ref <0.03)

## 2017-03-26 LAB — LIPASE, BLOOD: LIPASE: 37 U/L (ref 11–51)

## 2017-03-26 MED ORDER — GI COCKTAIL ~~LOC~~
30.0000 mL | Freq: Once | ORAL | Status: AC
  Administered 2017-03-26: 30 mL via ORAL
  Filled 2017-03-26: qty 30

## 2017-03-26 NOTE — ED Provider Notes (Signed)
[facility]   CSN: [id number] Arrival date & time: 03/26/17  1535     History   Chief Complaint Chief Complaint  Patient presents with  . Chest Pain    HPI Joan Thomas is a 31 y.o. female.  Patient is a 31 year old female with a history of GERD and hiatal hernia presenting today with chest discomfort. Patient states that she suffers from reflux on a chronic basis but on Sunday 4 days prior to arrival she had a 5-10 minute episode of an aching pain in the center of her chest that started shortly after eating and while standing. She states she started to feel anxious and slightly lightheaded but denied any shortness of breath, nausea, vomiting or diaphoresis. Pain did not radiate anywhere and resolve spontaneously. She has not had anymore of that pain however over the last 4 days she's had ongoing epigastric discomfort that she describes as a knot or ball in her epigastric area. She has tried eating which does not improve her symptoms she also has tried drinking Coke which also has not improved her symptoms. She is still taking her antacid but states the pain is been coming and going. She is currently having the pain but denies any vomiting. This is a pain she's experienced before with acid reflux. She does drink heavily on the weekends and is still intermittently using tobacco but denies any drug use. Family history is significant for grandparents with heart disease but no heart disease in her parents or siblings. She denies any unilateral leg pain or swelling. She takes no OCPs.   The history is provided by the patient.    Past Medical History:  Diagnosis Date  . GERD (gastroesophageal reflux disease)     There are no active problems to display for this patient.   Past Surgical History:  Procedure Laterality Date  . ECTOPIC PREGNANCY SURGERY      OB History    Gravida Para Term Preterm AB Living   1             SAB TAB Ectopic Multiple Live  Births                   Home Medications    Prior to Admission medications   Medication Sig Start Date End Date Taking? Authorizing Provider  omeprazole (PRILOSEC) 40 MG capsule Take 40 mg by mouth 2 (two) times daily.    [provider]    Family History Family History  Problem Relation Age of Onset  . Alcohol abuse Neg Hx   . Arthritis Neg Hx   . Asthma Neg Hx   . Birth defects Neg Hx   . Cancer Neg Hx   . COPD Neg Hx   . Depression Neg Hx   . Diabetes Neg Hx   . Drug abuse Neg Hx   . Early death Neg Hx   . Hearing loss Neg Hx   . Heart disease Neg Hx   . Hyperlipidemia Neg Hx   . Hypertension Neg Hx   . Kidney disease Neg Hx   . Learning disabilities Neg Hx   . Mental illness Neg Hx   . Mental retardation Neg Hx   . Miscarriages / Stillbirths Neg Hx   . Stroke Neg Hx   . Vision loss Neg Hx     Social History Social History  Substance Use Topics  . Smoking status: Current Every Day Smoker    Packs/day: 0.50  .  Smokeless tobacco: Never Used  . Alcohol use Yes     Comment: weekly     Allergies   Patient has no known allergies.   Review of Systems Review of Systems  All other systems reviewed and are negative.    Physical Exam Updated Vital Signs BP 128/79 (BP Location: Left Arm)   Pulse 64   Temp 98.9 F (37.2 C) (Oral)   Resp 18   Ht 5\' 4"  (1.626 m)   Wt 82.6 kg (182 lb)   LMP 03/21/2017   SpO2 100%   BMI 31.24 kg/m   Physical Exam  Constitutional: She is oriented to person, place, and time. She appears well-developed and well-nourished. No distress.  HENT:  Head: Normocephalic and atraumatic.  Mouth/Throat: Oropharynx is clear and moist.  Eyes: Conjunctivae and EOM are normal. Pupils are equal, round, and reactive to light.  Neck: Normal range of motion. Neck supple.  Cardiovascular: Normal rate, regular rhythm and intact distal pulses.   No murmur heard. Pulses are equal in bilateral upper and lower extremities    Pulmonary/Chest: Effort normal and breath sounds normal. No respiratory distress. She has no wheezes. She has no rales.  Abdominal: Soft. She exhibits no distension. There is tenderness in the epigastric area. There is no rebound and no guarding.  Mild epigastric discomfort.  Musculoskeletal: Normal range of motion. She exhibits no edema or tenderness.  Neurological: She is alert and oriented to person, place, and time.  Skin: Skin is warm and dry. No rash noted. No erythema.  Psychiatric: She has a normal mood and affect. Her behavior is normal.  Nursing note and vitals reviewed.    ED Treatments / Results  Labs (all labs ordered are listed, but only abnormal results are displayed) Labs Reviewed  CBC WITH DIFFERENTIAL/PLATELET - Abnormal; Notable for the following:       Result Value   Hemoglobin 10.3 (*)    HCT 31.6 (*)    MCV 74.0 (*)    MCH 24.1 (*)    RDW 17.4 (*)    All other components within normal limits  BASIC METABOLIC PANEL - Abnormal; Notable for the following:    Glucose, Bld 107 (*)    Calcium 8.7 (*)    All other components within normal limits  HEPATIC FUNCTION PANEL - Abnormal; Notable for the following:    Bilirubin, Direct <0.1 (*)    All other components within normal limits  TROPONIN I  LIPASE, BLOOD    EKG  EKG Interpretation  Date/Time:  Wednesday Mar 26 2017 15:52:24 EDT Ventricular Rate:  68 PR Interval:    QRS Duration: 90 QT Interval:  403 QTC Calculation: 429 R Axis:   77 Text Interpretation:  Sinus rhythm Atrial premature complex Borderline short PR interval No significant change since last tracing Confirmed by Gwyneth Sprout ([id number]) on 03/26/2017 4:09:10 PM       Radiology Dg Chest 2 View  Result Date: 03/26/2017 CLINICAL DATA:  Chest pain for 4 days EXAM: CHEST  2 VIEW COMPARISON:  01/17/2017 FINDINGS: The heart size and mediastinal contours are within normal limits. Both lungs are clear. The visualized skeletal structures are  unremarkable. IMPRESSION: No active cardiopulmonary disease. Electronically Signed   By: Signa Kell M.D.   On: 03/26/2017 17:34    Procedures Procedures (including critical care time)  Medications Ordered in ED Medications  gi cocktail (Maalox,Lidocaine,Donnatal) (30 mLs Oral Given 03/26/17 1635)     Initial Impression / Assessment and Plan /  ED Course  I have reviewed the triage vital signs and the nursing notes.  Pertinent labs & imaging results that were available during my care of the patient were reviewed by me and considered in my medical decision making (see chart for details).     Pt with atypical story for CP which is most likely related to reflux or abd pathology.  Low risk by heart score of 1 due to smoking.  PERC neg.  EKG nonacute.  Low suspicion for dissection, PE or ACS.  Possible GERD, pancreatitis.  Low suspicion for hepatitis or cholecysitis. CXR, CBC, CMP and lipase pending.  6:10 PM Labs and imaging reassuring.  Pt given GI follow and will try probiotic and continue ppi.   Final Clinical Impressions(s) / ED Diagnoses   Final diagnoses:  Nonspecific chest pain  Gastroesophageal reflux disease with esophagitis    New Prescriptions New Prescriptions   No medications on file     Gwyneth SproutPlunkett, Claborn Janusz, MD 03/26/17 1811

## 2017-03-26 NOTE — ED Notes (Signed)
Patient transported to X-ray 

## 2017-03-26 NOTE — ED Triage Notes (Signed)
C/o CP 4 days ago-denies pain at present

## 2017-03-27 ENCOUNTER — Encounter: Payer: Self-pay | Admitting: Nurse Practitioner

## 2017-04-02 ENCOUNTER — Other Ambulatory Visit: Payer: 59

## 2017-04-02 ENCOUNTER — Ambulatory Visit (INDEPENDENT_AMBULATORY_CARE_PROVIDER_SITE_OTHER): Payer: 59 | Admitting: Nurse Practitioner

## 2017-04-02 ENCOUNTER — Encounter: Payer: Self-pay | Admitting: Nurse Practitioner

## 2017-04-02 VITALS — BP 104/62 | HR 68 | Ht 64.0 in | Wt 183.2 lb

## 2017-04-02 DIAGNOSIS — R1013 Epigastric pain: Secondary | ICD-10-CM | POA: Diagnosis not present

## 2017-04-02 DIAGNOSIS — D509 Iron deficiency anemia, unspecified: Secondary | ICD-10-CM

## 2017-04-02 DIAGNOSIS — K5909 Other constipation: Secondary | ICD-10-CM

## 2017-04-02 DIAGNOSIS — K219 Gastro-esophageal reflux disease without esophagitis: Secondary | ICD-10-CM

## 2017-04-02 LAB — IRON AND TIBC
%SAT: 9 % — ABNORMAL LOW (ref 11–50)
IRON: 37 ug/dL — AB (ref 40–190)
TIBC: 417 ug/dL (ref 250–450)
UIBC: 380 ug/dL

## 2017-04-02 MED ORDER — RANITIDINE HCL 150 MG PO TABS: 150.0000 mg | ORAL_TABLET | Freq: Every day | ORAL | 3 refills | Status: DC

## 2017-04-02 NOTE — Progress Notes (Addendum)
Chief complaint: chest pain  HPI: Patient is a 31 year old female with a history of GERD. She is here with distal chest pain. Joan Thomas has chronic GERD diagnosed by Dr. Ewing SchleinMagod years ago. She was initially treated with Nexium 40 mg twice a day which worked great. It was eventually changed to omeprazole which also worked fine. Then at the beginning of this year patient was changed to Prilosec for insurance reasons. Since then her GERD symptoms have slowly started to return. Symptoms became much worse two weeks ago. She describes pressure in area of distal sternum, worse at night and non-exertional.   She has never had typical heartburn with GERD.  Because of the ongoing pressure in her chest patient went to the ED on 5/30. EKG showed NSR, labs pertinent for microcytic anemia with hemoglobin of 10.3. Troponin normal. Chem profile unremarkable. Chest x-ray unremarkable.  Over the last couple weeks she has also had sensation of food sticking in her esophagus when she eats. She did lose her prescription for Prilosec and had to take over-the-counter Prilosec for a week. Interestingly, patient was started on a probiotic while taking antibiotics for a cyst and her swallowing problems have since improved.  .   Past Medical History:  Diagnosis Date  . GERD (gastroesophageal reflux disease)     Past Surgical History:  Procedure Laterality Date  . ECTOPIC PREGNANCY SURGERY     Family History  Problem Relation Age of Onset  . Arthritis Maternal Grandmother   . GER disease Maternal Grandmother   . Diabetes Maternal Grandfather   . Heart disease Maternal Grandfather   . Prostate cancer Paternal Grandmother   . Cancer Paternal Grandfather        several types  . Breast cancer Maternal Aunt   . Prostate cancer Maternal Uncle   . Alcohol abuse Neg Hx   . Asthma Neg Hx   . Birth defects Neg Hx   . COPD Neg Hx   . Depression Neg Hx   . Drug abuse Neg Hx   . Early death Neg Hx   . Hearing loss Neg  Hx   . Hyperlipidemia Neg Hx   . Hypertension Neg Hx   . Kidney disease Neg Hx   . Learning disabilities Neg Hx   . Mental illness Neg Hx   . Mental retardation Neg Hx   . Miscarriages / Stillbirths Neg Hx   . Stroke Neg Hx   . Vision loss Neg Hx    Social History  Substance Use Topics  . Smoking status: Current Every Day Smoker    Packs/day: 0.50  . Smokeless tobacco: Never Used     Comment: trying to quit  . Alcohol use Yes     Comment: weekly   Current Outpatient Prescriptions  Medication Sig Dispense Refill  . clindamycin (CLEOCIN) 300 MG capsule Take 300 mg by mouth 4 (four) times daily. For 7 days    . omeprazole (PRILOSEC) 40 MG capsule Take 40 mg by mouth 2 (two) times daily.     No current facility-administered medications for this visit.    No Known Allergies   Review of Systems: All systems reviewed and negative except where noted in HPI.    Physical Exam: BP 104/62 (BP Location: Left Arm, Patient Position: Sitting, Cuff Size: Normal)   Pulse 68   Ht 5\' 4"  (1.626 m) Comment: height measured without shoes  Wt 183 lb 3 oz (83.1 kg)   LMP 03/21/2017   BMI  31.44 kg/m  Constitutional:  Well-developed, black female in no acute distress. Psychiatric: Normal mood and affect. Behavior is normal. EENT: Pupils normal.  Conjunctivae are normal. No scleral icterus. Neck supple.  Cardiovascular: Normal rate, regular rhythm. No edema Pulmonary/chest: Effort normal and breath sounds normal. No wheezing, rales or rhonchi. Abdominal: Soft, nondistended. Nontender. Bowel sounds active throughout. There are no masses palpable. No hepatomegaly. Lymphadenopathy: No cervical adenopathy noted. Neurological: Alert and oriented to person place and time. Skin: Skin is warm and dry. No rashes noted.   ASSESSMENT AND PLAN:  1. 31 year old female with chronic GERD, poorly controlled on Prilosec. Nexium worked great for years but insurance required a change to [employer] in January.  Now with slowly progressive GERD symptoms. She describes pressure in her distal chest, same symptoms as when first diagnosed with GERD by [facility].  -Get EGD report from Dr. Ewing Schlein, done sometime between 2012-2015 -Patient to call her insurance company about which PPI is formulary. She will let us know and we can try and adjust or change her PPI.  -In the interim add Zantac 150 milligrams daily at bedtime as her symptoms are worse at night -GERD literature given.  -Recommended a wedge pillow -Return to clinic to see me in 3-4 weeks. Hopefully by then her GERD regimen will be straightened out and symptoms improved. If not then consider EGD. Her symptoms are most compatible with GERD but if not improving it is not unreasonable to get an ultrasound to exclude gallbladder disease   2. Constipation -Trial of daily Citrucel -Increase water intake to 64 ounces daily  3. Microcytic anemia. Hgb low 10 range. No overt GI bleeding. Her cycles are regular but flow is light.  -Check iron studies -If iron deficient will discuss need for endoscopic workup when she returns   Willette Cluster, NP  04/02/2017, 3:04 PM   ADDENDUM: EGD report from [facility] Done Aug 2013 for dysphagia and GERD Exam normal.

## 2017-04-02 NOTE — Patient Instructions (Signed)
If you are age 32 or older, your body mass index should be between 23-30. Your Body mass index is 31.44 kg/m. If this is out of the aforementioned range listed, please consider follow up with your Primary Care Provider.  If you are age 32 or younger, your body mass index should be between 19-25. Your Body mass index is 31.44 kg/m. If this is out of the aformentioned range listed, please consider follow up with your Primary Care Provider.   Your physician has requested that you go to the basement for the following lab work before leaving today: Ferritin TIBC  We have sent the following medications to your pharmacy for you to pick up at your convenience: Zantac 150 mg  You have been given GERD Literature.  Drink 64 ounces of water daily.  Start Citrucel daily as directed for constipation.  DO NOT substitute Metamucil.  Use a wedge pillow.  Please call us back after talking with insurance company regarding PPI.  Thank you for choosing me and [facility].   Willette ClusterPaula Guenther, NP

## 2017-04-03 ENCOUNTER — Telehealth: Payer: Self-pay | Admitting: Nurse Practitioner

## 2017-04-03 NOTE — Telephone Encounter (Signed)
Please make sure that is not what she is already taking. I think she is taking generic for prilosec but wants nexium

## 2017-04-04 NOTE — Telephone Encounter (Signed)
The patient states she is on Esomeprazole that is not working well and he insurance formulary states she could have omeprazole. Patient states she have tried this before and it helped. So what strength and dose would you like patient to be on?

## 2017-04-08 NOTE — Progress Notes (Signed)
Thank you for sending this case to me. I have reviewed the entire note, and the outlined plan seems appropriate.  Anemia is still most likely from chronic menstrual blood loss.  If iron deficiency, endoscopic workup for that would only be necessary if has heme positive stool cards.  Review prior EGD report to see if Bx for EoE were taken.  Even if not present then, that is still a consideration now. Persistent symptoms of dysphagia, even with change of acid suppression, mean EGD should be scheduled.  Amada JupiterHenry Danis, MD

## 2017-04-09 NOTE — Telephone Encounter (Signed)
Lets try omeprazole 40mg  in am before breakfast. She can take the zantac 150mg  before dinner IF she has breakthrough symptoms. Thanks

## 2017-04-10 NOTE — Telephone Encounter (Signed)
Called the patient and left a voicemail for her to return my call.

## 2017-04-10 NOTE — Telephone Encounter (Signed)
Patient returning phone call to Sophia best call back # is [phone].

## 2017-04-11 MED ORDER — OMEPRAZOLE 40 MG PO CPDR: 40.0000 mg | DELAYED_RELEASE_CAPSULE | Freq: Every day | ORAL | 3 refills | Status: DC

## 2017-04-11 NOTE — Telephone Encounter (Signed)
Medication sent in to the pharmacy for Omeprazole 40 mg #90 with 3 refills.

## 2017-04-14 NOTE — Telephone Encounter (Signed)
Pt returned my call and was informed to take Omeprazole once a day and Zantac at night. Also medication was sent in to pharmacy.

## 2017-04-29 ENCOUNTER — Telehealth: Payer: Self-pay | Admitting: Nurse Practitioner

## 2017-04-29 ENCOUNTER — Ambulatory Visit: Payer: 59 | Admitting: Nurse Practitioner

## 2017-06-10 ENCOUNTER — Encounter: Payer: Self-pay | Admitting: Gastroenterology

## 2017-06-10 ENCOUNTER — Ambulatory Visit (INDEPENDENT_AMBULATORY_CARE_PROVIDER_SITE_OTHER): Payer: 59 | Admitting: Gastroenterology

## 2017-06-10 ENCOUNTER — Encounter (INDEPENDENT_AMBULATORY_CARE_PROVIDER_SITE_OTHER): Payer: Self-pay

## 2017-06-10 VITALS — BP 90/60 | HR 76 | Ht 64.0 in | Wt 185.6 lb

## 2017-06-10 DIAGNOSIS — D508 Other iron deficiency anemias: Secondary | ICD-10-CM | POA: Diagnosis not present

## 2017-06-10 DIAGNOSIS — K219 Gastro-esophageal reflux disease without esophagitis: Secondary | ICD-10-CM

## 2017-06-10 DIAGNOSIS — K5900 Constipation, unspecified: Secondary | ICD-10-CM

## 2017-06-10 MED ORDER — OMEPRAZOLE 40 MG PO CPDR: 40.0000 mg | DELAYED_RELEASE_CAPSULE | Freq: Every day | ORAL | 1 refills | Status: DC

## 2017-06-10 NOTE — Patient Instructions (Addendum)
Slow release iron tablet twice a week.  If that does not worsen constipation, then increase to three times a week.  We have sent the following prescriptions to your mail in pharmacy: Omeprazole 40 mg once daily  Follow up in 1 year.  If you have not heard from your mail in pharmacy within 1 week or if you have not received your medication in the mail, please contact us at [phone] so we may find out why.  If you are age 32 or older, your body mass index should be between 23-30. Your Body mass index is 31.86 kg/m. If this is out of the aforementioned range listed, please consider follow up with your Primary Care Provider.  If you are age 32 or younger, your body mass index should be between 19-25. Your Body mass index is 31.86 kg/m. If this is out of the aformentioned range listed, please consider follow up with your Primary Care Provider.

## 2017-06-10 NOTE — Progress Notes (Signed)
     [facility] Progress Note  Chief Complaint: GERD and constipation  Subjective  History:  This is follow-up for a 31 year old woman who was last seen 2 months ago for chronic GERD and constipation as well as iron deficiency anemia. She is much improved on the current regimen of Prilosec 40 mg in the morning and 20 mg in the evening. Bowel habits of apparently improved considerably on a probiotic. She has never had rectal bleeding, and her iron deficiency anemia appears on the basis of chronic menstrual blood loss. She has had a similar hemoglobin dating back to at least 2014. Joan Thomas denies dysphagia, odynophagia, nausea, vomiting, early satiety or weight loss.  ROS: Cardiovascular:  no chest pain Respiratory: no dyspnea  The patient's Past Medical, Family and Social History were reviewed and are on file in the EMR.  Objective:  Med list reviewed  Vital signs in last 24 hrs: Vitals:   06/10/17 1602  BP: 90/60  Pulse: 76    Physical Exam    HEENT: sclera anicteric, oral mucosa moist without lesions  Neck: supple, no thyromegaly, JVD or lymphadenopathy  Cardiac: RRR without murmurs, S1S2 heard, no peripheral edema  Pulm: clear to auscultation bilaterally, normal RR and effort noted  Abdomen: soft, Overweight, no tenderness, with active bowel sounds. No guarding or palpable hepatosplenomegaly.  Skin; warm and dry, no jaundice or rash  Recent Labs:  CBC Latest Ref Rng & Units 03/26/2017 05/12/2013 05/09/2013  WBC 4.0 - 10.5 K/uL 4.8 4.1 3.2(L)  Hemoglobin 12.0 - 15.0 g/dL 10.3(L) 10.3(L) 10.3(L)  Hematocrit 36.0 - 46.0 % 31.6(L) 31.7(L) 31.6(L)  Platelets 150 - 400 K/uL 260 218 188   Iron sat 9%  [facility] 2015   @ASSESSMENTPLANBEGIN @ Assessment: Encounter Diagnoses  Name Primary?  . Gastroesophageal reflux disease without esophagitis Yes  . Other iron deficiency anemia     . Constipation, unspecified constipation type    She has many years of chronic reflux symptoms requiring high-dose antiacid therapy. This was the case when she was last evaluated with an EGD 3 years ago. She does not have dysphagia to suggest eosinophilic esophagitis.  Constipation is improved on change of diet and addition of probiotics  Iron deficiency anemia from menstrual blood loss. I recommended slow release iron 3 times a week. If that does not worsen the constipation, she will increase to twice a week. Blood counts can be rechecked by primary care. I will see her in a year or sooner as needed. She is instructed to call me if she develops dysphagia, rectal bleeding, or reflux symptoms that are unresponsive to medicines.  She was also strongly counseled to stop smoking many health reasons including GERD.  Total time 25 minutes, over half spent in counseling and coordination of care.   Charlie PitterHenry L Danis III

## 2017-07-04 ENCOUNTER — Telehealth: Payer: Self-pay | Admitting: Gastroenterology

## 2017-07-04 NOTE — Telephone Encounter (Signed)
Patient called and she states has been taking the omeprazole 40 mg BID, she is having difficulty with swallowing, has sensation like food is still in her throat. I did let her know that Dr. Myrtie Neitheranis is out of the office today, she is able to swallow. Let her know that I would call her Monday with recommendations.

## 2017-07-07 ENCOUNTER — Telehealth: Payer: Self-pay | Admitting: Gastroenterology

## 2017-07-07 NOTE — Telephone Encounter (Signed)
Routed to Dr. Myrtie Neitheranis, he is hospital doctor this week, therefore may not have had a chance to address this issue.

## 2017-07-07 NOTE — Telephone Encounter (Signed)
This is the first I have been able to reply - I was out of the office Friday and I at the hospital today.  I am sorry to hear she is not feeling well.  This is surprising since she felt so well at the recent clinic visit.  If she can keep down liquids, then please try to have her seen in clinic tomorrow by an APP.  If she can get nothing down, then she needs to go to the ED.

## 2017-07-07 NOTE — Telephone Encounter (Signed)
Patient is able to keep liquids down, she will come in and see Zerita BoersPaula G. tomorrow. Let her know that if she couldn't keep that down then she would need to be seen at the hospital.

## 2017-07-07 NOTE — Telephone Encounter (Signed)
Patient calling back regarding this message states she is unable to eat anything because she cannot eat without choking and is feeling very light headed also.

## 2017-07-08 ENCOUNTER — Encounter: Payer: Self-pay | Admitting: Nurse Practitioner

## 2017-07-08 ENCOUNTER — Ambulatory Visit (INDEPENDENT_AMBULATORY_CARE_PROVIDER_SITE_OTHER): Payer: 59 | Admitting: Nurse Practitioner

## 2017-07-08 VITALS — BP 120/80 | HR 72 | Ht 64.0 in | Wt 181.0 lb

## 2017-07-08 DIAGNOSIS — R1310 Dysphagia, unspecified: Secondary | ICD-10-CM | POA: Diagnosis not present

## 2017-07-08 DIAGNOSIS — K219 Gastro-esophageal reflux disease without esophagitis: Secondary | ICD-10-CM | POA: Diagnosis not present

## 2017-07-08 MED ORDER — ESOMEPRAZOLE MAGNESIUM 20 MG PO CPDR: 20.0000 mg | DELAYED_RELEASE_CAPSULE | Freq: Two times a day (BID) | ORAL | 3 refills | Status: DC

## 2017-07-08 NOTE — Progress Notes (Signed)
     HPI: Patient is a 31 yo female known to Dr. Myrtie Neitheranis . I saw her a few months ago for uncontrolled GERD / dysphagia.Marland Kitchen. GERD manifests as "pressure" in chest rather than heartburn. She had been changing around PPIs based on what insurance covered. Nexium seems to work best for her but insurance preferred Prilosec. I added Ranitidine at night but at some point this was apparently stopped and PPI increased to BID. She had a follow up with Dr. Myrtie Neitheranis mid August and GERD / dysphagia were better. Now back with recurrent solid food dysphagia.  Not having the chest "pressure" with GERD On Sunday food became lodged in esophagus. She took some Tums and food went down after 30 minutes. Since then she is having problems with all solids. No chest pain or SOB.  No odynophagia   Past Medical History:  Diagnosis Date  . GERD (gastroesophageal reflux disease)     Patient's surgical history, family medical history, social history, medications and allergies were all reviewed in Epic    Physical Exam: BP 120/80   Pulse 72   Ht 5\' 4"  (1.626 m)   Wt 181 lb (82.1 kg)   SpO2 98%   BMI 31.07 kg/m   GENERAL:well developed black female in NAD PSYCH: :Pleasant, cooperative, normal affect EENT:  conjunctiva pink, mucous membranes moist, neck supple without masses CARDIAC:  RRR, no murmur heard, no peripheral edema PULM: Normal respiratory effort, lungs CTA bilaterally, no wheezing ABDOMEN:  soft, nontender, nondistended, no obvious masses, no hepatomegaly,  normal bowel sounds SKIN:  turgor, no lesions seen Musculoskeletal:  normal muscle tone, normal strength NEURO: Alert and oriented x 3, no focal neurologic deficits    ASSESSMENT and PLAN:  Pleasant 31 yo female with hx of GERD manifested as chest pressure. No recent chest pressure on BID PPI but she is having recurrent solid food dysphagia (described same when I saw her in June).  -Given persistence / recurrence of solid food dysphagia will schedule  her for EGD with possible dilation. The risks and benefits of the procedure were discussed and the patient agrees to proceed.  -Advised patient to eat small bites, chew well with liquids in between bites to avoid food impaction.    Willette ClusterPaula Guenther , NP 07/08/2017, 3:24 PM

## 2017-07-08 NOTE — Patient Instructions (Signed)
If you are age 32 or older, your 63body mass index should be between 23-30. Your Body mass index is 31.07 kg/m. If this is out of the aforementioned range listed, please consider follow up with your Primary Care Provider.  If you are age 32 or younger, your body mass index should be between 19-25. Your Body mass index is 31.07 kg/m. If this is out of the aformentioned range listed, please consider follow up with your Primary Care Provider.   You have been scheduled for an endoscopy. Please follow written instructions given to you at your visit today. If you use inhalers (even only as needed), please bring them with you on the day of your procedure. Your physician has requested that you go to www.startemmi.com and enter the access code given to you at your visit today. This web site gives a general overview about your procedure. However, you should still follow specific instructions given to you by our office regarding your preparation for the procedure.  We have sent the following medications to your pharmacy for you to pick up at your convenience: Nexium 20 mg  STOP Omeprazole  Thank you for choosing me and [facility].   Willette ClusterPaula Guenther, NP

## 2017-07-10 ENCOUNTER — Encounter: Payer: Self-pay | Admitting: Gastroenterology

## 2017-07-10 ENCOUNTER — Telehealth: Payer: Self-pay

## 2017-07-10 NOTE — Telephone Encounter (Signed)
Spoke with patient regarding her Ins. Company requesting prior authorization for Nexium.  In speaking with the patient she states she has only taken Nexium once since given samples at her visit.  She stated she hasn't taken anymore because it did not help. I asked did she take it twice daily as prescribed; she stated no. I told her that she needed to take twice daily as prescribed in order to see if this medication works for her and also there was no need to get a prior authorization if the medication is not helpful.  She stated she would start taking twice daily.  She is scheduled for her EGD on Tues. 07/14/17.  I also asked her to call her insurance company to see what PPI is covered and/or restrictions on dosing.  Malachi BondsGloria, RMA

## 2017-07-13 NOTE — Progress Notes (Signed)
Thank you for sending this case to me. I have reviewed the entire note, and the outlined plan seems appropriate.  If EGD unrevealing, will schedule esophageal manometry.   Amada Jupiter, MD

## 2017-07-15 ENCOUNTER — Telehealth: Payer: Self-pay | Admitting: Gastroenterology

## 2017-07-15 ENCOUNTER — Ambulatory Visit (AMBULATORY_SURGERY_CENTER): Payer: 59 | Admitting: Gastroenterology

## 2017-07-15 ENCOUNTER — Encounter: Payer: Self-pay | Admitting: Gastroenterology

## 2017-07-15 VITALS — BP 117/74 | HR 75 | Temp 98.4°F | Resp 15 | Ht 64.0 in | Wt 181.0 lb

## 2017-07-15 DIAGNOSIS — R1310 Dysphagia, unspecified: Secondary | ICD-10-CM

## 2017-07-15 DIAGNOSIS — K219 Gastro-esophageal reflux disease without esophagitis: Secondary | ICD-10-CM

## 2017-07-15 MED ORDER — SODIUM CHLORIDE 0.9 % IV SOLN: 500.0000 mL | INTRAVENOUS | Status: DC

## 2017-07-15 NOTE — Telephone Encounter (Signed)
Please contact this patient and schedule a 24 pH study and esophageal manometry for GERD and dysphagia. I want her to be OFF all acid suppression medicine (i.e. Omeprazole, nexium or ranitidine) for 5 days prior to the tests. As-needed tums can be used during those 5 days.  Then I would like her to get an office visit schedule with me for 3 weeks after it is done in order to give Dr. Lavon Paganini time to read the studies.

## 2017-07-15 NOTE — Op Note (Signed)
[facility] Patient Name: Joan Thomas Procedure Date: 07/15/2017 9:31 AM MRN: [id number] Endoscopist: Sherilyn Cooter L. Myrtie Neither , MD Age: 31 Referring MD:  Date of Birth: 16-Apr-1986 Gender: Female Account #: [id number] Procedure:                Upper GI endoscopy Indications:              Dysphagia, Suspected esophageal reflux, Chest pain                            (non cardiac) Medicines:                Monitored Anesthesia Care Procedure:                Pre-Anesthesia Assessment:                           - Prior to the procedure, a History and Physical                            was performed, and patient medications and                            allergies were reviewed. The patient's tolerance of                            previous anesthesia was also reviewed. The risks                            and benefits of the procedure and the sedation                            options and risks were discussed with the patient.                            All questions were answered, and informed consent                            was obtained. Prior Anticoagulants: The patient has                            taken no previous anticoagulant or antiplatelet                            agents. ASA Grade Assessment: II - A patient with                            mild systemic disease. After reviewing the risks                            and benefits, the patient was deemed in                            satisfactory condition to undergo the procedure.  After obtaining informed consent, the endoscope was                            passed under direct vision. Throughout the                            procedure, the patient's blood pressure, pulse, and                            oxygen saturations were monitored continuously. The                            Model GIF-HQ190 [id number]) scope was introduced                            through the mouth, and advanced to the  second part                            of duodenum. The upper GI endoscopy was                            accomplished without difficulty. The patient                            tolerated the procedure well. Scope In: Scope Out: Findings:                 The larynx was normal.                           The esophagus was normal. Specifically, there was                            no ring, stricture or mucosal changes of EoE.                           The stomach was normal.                           The cardia and gastric fundus were normal on                            retroflexion.                           The examined duodenum was normal. Complications:            No immediate complications. Estimated Blood Loss:     Estimated blood loss: none. Impression:               - Normal larynx.                           - Normal esophagus.                           - Normal stomach.                           -  Normal examined duodenum.                           - No specimens collected. Recommendation:           - Patient has a contact number available for                            emergencies. The signs and symptoms of potential                            delayed complications were discussed with the                            patient. Return to normal activities tomorrow.                            Written discharge instructions were provided to the                            patient.                           - Resume previous diet.                           - Continue present medications.                           - Perform ambulatory 24 hr esophageal pH monitoring                            (off PPI 5 days prior) and esophageal manometry at                            the next available appointment. Henry L. Myrtie Neither, MD 07/15/2017 9:54:38 AM This report has been signed electronically.

## 2017-07-15 NOTE — Progress Notes (Signed)
Spontaneous respirations throughout. VSS. Resting comfortably. To PACU on room air. Report to  RN. 

## 2017-07-15 NOTE — Patient Instructions (Signed)
YOU HAD AN ENDOSCOPIC PROCEDURE TODAY AT THE [facility]:   Refer to the procedure report that was given to you for any specific questions about what was found during the examination.  If the procedure report does not answer your questions, please call your gastroenterologist to clarify.  If you requested that your care partner not be given the details of your procedure findings, then the procedure report has been included in a sealed envelope for you to review at your convenience later.  YOU SHOULD EXPECT: Some feelings of bloating in the abdomen. Passage of more gas than usual.  Walking can help get rid of the air that was put into your GI tract during the procedure and reduce the bloating. If you had a lower endoscopy (such as a colonoscopy or flexible sigmoidoscopy) you may notice spotting of blood in your stool or on the toilet paper. If you underwent a bowel prep for your procedure, you may not have a normal bowel movement for a few days.  Please Note:  You might notice some irritation and congestion in your nose or some drainage.  This is from the oxygen used during your procedure.  There is no need for concern and it should clear up in a day or so.  SYMPTOMS TO REPORT IMMEDIATELY:    Following upper endoscopy (EGD)  Vomiting of blood or coffee ground material  New chest pain or pain under the shoulder blades  Painful or persistently difficult swallowing  New shortness of breath  Fever of 100F or higher  Black, tarry-looking stools  For urgent or emergent issues, a gastroenterologist can be reached at any hour by calling [phone].  Dr. Irving Burton office will call with appointment for the 24 hour PH monitoring and esophageal manometry.  DIET:  We do recommend a small meal at first, but then you may proceed to your regular diet.  Drink plenty of fluids but you should avoid alcoholic beverages for 24 hours.  ACTIVITY:  You should plan to take it easy for the rest of today  and you should NOT DRIVE or use heavy machinery until tomorrow (because of the sedation medicines used during the test).    FOLLOW UP: Our staff will call the number listed on your records the next business day following your procedure to check on you and address any questions or concerns that you may have regarding the information given to you following your procedure. If we do not reach you, we will leave a message.  However, if you are feeling well and you are not experiencing any problems, there is no need to return our call.  We will assume that you have returned to your regular daily activities without incident.  If any biopsies were taken you will be contacted by phone or by letter within the next 1-3 weeks.  Please call us at [phone] if you have not heard about the biopsies in 3 weeks.    SIGNATURES/CONFIDENTIALITY: You and/or your care partner have signed paperwork which will be entered into your electronic medical record.  These signatures attest to the fact that that the information above on your After Visit Summary has been reviewed and is understood.  Full responsibility of the confidentiality of this discharge information lies with you and/or your care-partner.  Thank you for letting us take care of your healthcare needs today.

## 2017-07-15 NOTE — Progress Notes (Signed)
Pt's states no medical or surgical changes since previsit or office visit. 

## 2017-07-16 ENCOUNTER — Other Ambulatory Visit: Payer: Self-pay

## 2017-07-16 ENCOUNTER — Telehealth: Payer: Self-pay | Admitting: *Deleted

## 2017-07-16 DIAGNOSIS — R1310 Dysphagia, unspecified: Secondary | ICD-10-CM

## 2017-07-16 DIAGNOSIS — K219 Gastro-esophageal reflux disease without esophagitis: Secondary | ICD-10-CM

## 2017-07-16 NOTE — Telephone Encounter (Signed)
  Follow up Call-  Call back number 07/15/2017  Post procedure Call Back phone  # [phone]  Permission to leave phone message Yes  Some recent data might be hidden     Patient questions:  Do you have a fever, pain , or abdominal swelling? No. Pain Score  0 *  Have you tolerated food without any problems? Yes.    Have you been able to return to your normal activities? Yes.    Do you have any questions about your discharge instructions: Diet   No. Medications  No. Follow up visit  No.  Do you have questions or concerns about your Care? Yes.    Actions: * If pain score is 4 or above: No action needed, pain <4. Pt. Wanted to know about scheduling of 24 hr. Esophageal monitoring  And manometry.  I advised her that she should be contacted by GI office and to get in touch with office if she hasn't heard from them by this Friday, 07/18/17.

## 2017-07-16 NOTE — Telephone Encounter (Signed)
Patient scheduled for tests, understands to stop acid suppression medication 5 days prior. Prep instructions mailed to patient along with follow up appointment date/time.

## 2017-07-22 ENCOUNTER — Telehealth: Payer: Self-pay

## 2017-07-22 NOTE — Telephone Encounter (Signed)
Joan Thomas, I called patient to follow up on Nexium 20 mg bid. Samples were given at office visit.  Her ins. Requires prior authorization.  Patient states she took Nexium bid as instructed until finished; she ran out, so she went back on Omeprazole 40 mg bid and states this works better for her than the Nexium. She states her symptoms have improved on Omeprazole 40 bid.  Please let me know if there is anything further I need to do regarding medication. Thanks, Malachi Bonds RMA

## 2017-07-22 NOTE — Telephone Encounter (Signed)
Joan Thomas, either is fine if both work equally as well. Thanks

## 2017-07-28 ENCOUNTER — Encounter (HOSPITAL_COMMUNITY)
Admission: RE | Disposition: A | Discharge status: Home / Self Care | Payer: Self-pay | Source: Ambulatory Visit | Attending: Gastroenterology

## 2017-07-28 ENCOUNTER — Ambulatory Visit (HOSPITAL_COMMUNITY)
Admission: RE | Admit: 2017-07-28 | Discharge: 2017-07-28 | Disposition: A | Discharge status: Home / Self Care | Payer: 59 | Source: Ambulatory Visit | Attending: Gastroenterology | Admitting: Gastroenterology

## 2017-07-28 DIAGNOSIS — R1310 Dysphagia, unspecified: Secondary | ICD-10-CM

## 2017-07-28 DIAGNOSIS — K219 Gastro-esophageal reflux disease without esophagitis: Secondary | ICD-10-CM | POA: Diagnosis present

## 2017-07-28 HISTORY — PX: ESOPHAGEAL MANOMETRY: SHX5429

## 2017-07-28 HISTORY — PX: PH IMPEDANCE STUDY: SHX5565

## 2017-07-28 SURGERY — IMPEDANCE PH STUDY, ESOPHAGUS
Anesthesia: Topical

## 2017-07-28 MED ORDER — LIDOCAINE VISCOUS 2 % MT SOLN
OROMUCOSAL | Status: AC
  Filled 2017-07-28: qty 15

## 2017-07-28 SURGICAL SUPPLY — 2 items
FACESHIELD LNG OPTICON STERILE (SAFETY) IMPLANT
GLOVE BIO SURGEON STRL SZ8 (GLOVE) ×6 IMPLANT

## 2017-07-28 NOTE — Progress Notes (Signed)
Esophageal manometry and 24 hour Ph study done per protocol.  Pt tolerated both procedures well, w/o complications.  Pt verbalized understanding of education given regarding study and will return tomorrow to have Ph probe remove.  Dr. Lavon Paganini will be notified today.  Omelia Blackwater, RN

## 2017-07-29 ENCOUNTER — Encounter (HOSPITAL_COMMUNITY): Payer: Self-pay | Admitting: Gastroenterology

## 2017-07-29 DIAGNOSIS — R1310 Dysphagia, unspecified: Secondary | ICD-10-CM

## 2017-07-29 DIAGNOSIS — K219 Gastro-esophageal reflux disease without esophagitis: Secondary | ICD-10-CM

## 2017-08-01 DIAGNOSIS — K219 Gastro-esophageal reflux disease without esophagitis: Secondary | ICD-10-CM | POA: Diagnosis not present

## 2017-08-11 ENCOUNTER — Telehealth: Payer: Self-pay | Admitting: Gastroenterology

## 2017-08-11 NOTE — Telephone Encounter (Signed)
pH testing shows very little reflux, which is why it does not always feel better with antacid medication.  She seems to have a sensitive esophagus, even to the very little reflux that she has.  I again recommend stopping smoking and I will see her as scheduled next month.

## 2017-08-20 ENCOUNTER — Ambulatory Visit: Payer: 59 | Admitting: Gastroenterology

## 2017-09-04 ENCOUNTER — Ambulatory Visit (INDEPENDENT_AMBULATORY_CARE_PROVIDER_SITE_OTHER): Payer: 59 | Admitting: Gastroenterology

## 2017-09-04 ENCOUNTER — Encounter: Payer: Self-pay | Admitting: Gastroenterology

## 2017-09-04 VITALS — BP 110/70 | HR 68 | Ht 64.0 in | Wt 176.2 lb

## 2017-09-04 DIAGNOSIS — K219 Gastro-esophageal reflux disease without esophagitis: Secondary | ICD-10-CM | POA: Diagnosis not present

## 2017-09-04 DIAGNOSIS — R1310 Dysphagia, unspecified: Secondary | ICD-10-CM | POA: Diagnosis not present

## 2017-09-04 NOTE — Progress Notes (Signed)
     [facility] Progress Note  Chief Complaint: Heartburn  Subjective  History:  Delaney Meigsamara follows up for her heartburn after recent pH and impedance testing. Her EGD was normal  Lately she has been taking omeprazole 40 mg once daily, but feels that the OTC dose would probably work just as well. There is been no recent dysphagia. She is trying to cut down on smoking, says she only does so when she is under stress.  ROS: Cardiovascular:  no chest pain Respiratory: no dyspnea  The patient's Past Medical, Family and Social History were reviewed and are on file in the EMR.  Objective:  Med list reviewed  Current Outpatient Medications:  Marland Kitchen.  Methylcellulose, Laxative, (CITRUCEL PO), 1 scoop as needed for constipation, Disp: , Rfl:  .  omeprazole (PRILOSEC) 40 MG capsule, Take 1 capsule daily by mouth., Disp: , Rfl: 3 .  Probiotic Product (PROBIOTIC PO), Take 1 capsule daily, Disp: , Rfl:    Vital signs in last 24 hrs: Vitals:   09/04/17 1553  BP: 110/70  Pulse: 68    Physical Exam    HEENT: sclera anicteric, oral mucosa moist without lesions  Neck: supple, no thyromegaly, JVD or lymphadenopathy  Cardiac: RRR without murmurs, S1S2 heard, no peripheral edema  Pulm: clear to auscultation bilaterally, normal RR and effort noted  Abdomen: soft, no tenderness, with active bowel sounds. No guarding or palpable hepatosplenomegaly.  Skin; warm and dry, no jaundice or rash  Recent Labs:   PH/impedance:  Done off ppi Normal acid exposure Some symptom correlation to reflux events (esophageal sensitivity)   @ASSESSMENTPLANBEGIN @ Assessment: Encounter Diagnoses  Name Primary?  . Gastroesophageal reflux disease without esophagitis Yes  . Dysphagia, unspecified type    We discussed the situation of normal acid exposure and probable esophageal hypersensitivity. I still believe that quitting smoking would help. I would like her to decrease PPI use with the intention of  discontinuing altogether.  She will go down to Prilosec OTC 20 mg every other day for a couple of weeks. She is willing to live with some breakthrough heartburn since I reassured her that is not harmful. At that point, she can use Pepcid Complete as needed.  I have reassured her that there is no mechanical cause for the dysphagia, it appears to be intermittent [place] dysmotility from reflux.  If she has severe symptom recurrence in the future, low-dose TCA could be considered.  She will see me as needed.  Total time 15 minutes face-to-face, over half spent in counseling and coordination of care.   Charlie PitterHenry L Danis III

## 2017-09-04 NOTE — Patient Instructions (Addendum)
For as-needed treatment of heartburn, use "pepcid complete" (or generic form) instead of omeprazole.  If you are age 665 or older, your body mass index should be between 23-30. Your Body mass index is 30.25 kg/m. If this is out of the aforementioned range listed, please consider follow up with your Primary Care Provider.  If you are age 32 or younger, your body mass index should be between 19-25. Your Body mass index is 30.25 kg/m. If this is out of the aformentioned range listed, please consider follow up with your Primary Care Provider.   Thank you for choosing [facility]  Dr Amada JupiterHenry Danis III

## 2017-11-26 ENCOUNTER — Telehealth: Payer: Self-pay | Admitting: Gastroenterology

## 2017-11-26 ENCOUNTER — Other Ambulatory Visit: Payer: Self-pay

## 2017-11-26 MED ORDER — OMEPRAZOLE 20 MG PO CPDR: 20.0000 mg | DELAYED_RELEASE_CAPSULE | Freq: Every day | ORAL | 3 refills | Status: DC

## 2017-11-26 NOTE — Telephone Encounter (Signed)
She can go back on omeprazole 20 mg once daily if she feels that is helpful for her swallowing. I again advise smoking cessation because this problem will not improve otherwise.

## 2017-11-26 NOTE — Telephone Encounter (Signed)
Patient states that she still has intermittent dysphagia, lasting for a day. Using OTC Pepcid AC, Tums or Prilosec prn. When she is experiencing this, states cannot swallow food but is able to tolerate liquids.

## 2017-11-26 NOTE — Telephone Encounter (Signed)
Left detailed message for patient that she may restart the omeprazole 20 mg daily, if she needs a prescription sent in to let us know. Also advised to stop smoking which will help improve her symptoms.

## 2017-11-26 NOTE — Telephone Encounter (Signed)
Pt states she would like [facility] [place].

## 2017-12-10 ENCOUNTER — Encounter: Payer: Self-pay | Admitting: Nurse Practitioner

## 2017-12-10 ENCOUNTER — Ambulatory Visit (INDEPENDENT_AMBULATORY_CARE_PROVIDER_SITE_OTHER): Payer: 59 | Admitting: Nurse Practitioner

## 2017-12-10 VITALS — BP 122/72 | HR 70 | Ht 64.0 in | Wt 179.0 lb

## 2017-12-10 DIAGNOSIS — R1310 Dysphagia, unspecified: Secondary | ICD-10-CM

## 2017-12-10 NOTE — Patient Instructions (Signed)
If you are age 33 or older, your body mass index should be between 23-30. Your Body mass index is 30.73 kg/m. If this is out of the aforementioned range listed, please consider follow up with your Primary Care Provider.  If you are age 33 or younger, your body mass index should be between 19-25. Your Body mass index is 30.73 kg/m. If this is out of the aformentioned range listed, please consider follow up with your Primary Care Provider.   Chew well.  Small bites. Fluids in between bites.  Take over-the-counter Nexium - one every 2-3 days and take Omeprazole on the other days.  Thank you for choosing me and [facility].   Willette ClusterPaula Guenther, NP

## 2017-12-10 NOTE — Progress Notes (Signed)
      IMPRESSION and PLAN:    32 yo female with GERD / chronic intermittent solid food dysphagia with negative EGD and manometry . A pH study was compatible with GERD.  She is asymptomatic from GERD standpoint on omeprazole PPI but swallowing still problematic when when unable to get brand Nexium as is the case right now.  -She can't afford OTC Nexium and insurance will not pay for it. Currently takes Omeprazole 20mg  before meals (TID) which helps some but still with frequent, though random solid food dysphagia. She will try Nexium OTC every 2nd or 3rd day ( as can afford it ) and continue omeprazole on other days.  -Doesn't sound like she chops food to fine consistency, this may help.  -liquids in between bites -Short of these interventions, with negative workup I don't have much else to offer. Will see if Dr Myrtie Neitheranis has anything to add.   HPI:    Chief Complaints:  Patient is a 32 yo female who I saw several months ago for dysphagia. Subsequent EGD was negative. Manometry study was negative, pH study was consistent with GERD. No heartburn on omeprazole but she continues to have intermittent solid food dysphagia, especially when unable to get brand Nexium as is case now.   Takes omeprazole, sometimes up to a three a day before meals to prevent dysphagia and this has been helpful. Overeating makes it worse.  Meats, breads, bananas get stuck. Sx are not consistent, sometimes she eats same food without difficulty. She hasn't tried cutting up food into tiny pieces. Also doesn't drink a lot of fluids. Throat not unusually dry. No dry eyes. She swallows liquids fine. Pyrosis controlled with omeprazole.  She quit smoking.   ROS:  No significant coughing. No SOB. No chest pain. Her weight is stable.    Past Medical History:  Diagnosis Date  . GERD (gastroesophageal reflux disease)     Patient's surgical history, family medical history, social history, medications and allergies were all reviewed in  Epic      Physical Exam:     BP 122/72   Pulse 70   Ht 5\' 4"  (1.626 m)   Wt 179 lb (81.2 kg)   BMI 30.73 kg/m   GENERAL: Well-developed, black female in NAD PSYCH: :Pleasant, cooperative, normal affect EENT:  conjunctiva pink, mucous membranes moist, no Candida. Neck supple without masses NEURO: Alert and oriented x 3, no focal neurologic deficits   Willette ClusterPaula Brice Kossman , NP 12/10/2017, 3:42 PM

## 2017-12-11 NOTE — Progress Notes (Signed)
Thank you for sending this case to me. I have reviewed the entire note, and the outlined plan seems appropriate.  She had not mechanical or motility cause of dysphagia found on workup as described.  I suspect there is some visceral hypersensitivity with reflux events.  Reassurance.  Amada JupiterHenry Danis, MD

## 2018-01-05 ENCOUNTER — Other Ambulatory Visit: Payer: Self-pay

## 2018-01-05 ENCOUNTER — Encounter (HOSPITAL_BASED_OUTPATIENT_CLINIC_OR_DEPARTMENT_OTHER): Payer: Self-pay | Admitting: *Deleted

## 2018-01-05 ENCOUNTER — Emergency Department (HOSPITAL_BASED_OUTPATIENT_CLINIC_OR_DEPARTMENT_OTHER)
Admission: EM | Admit: 2018-01-05 | Discharge: 2018-01-05 | Disposition: A | Discharge status: Home / Self Care | Payer: 59 | Attending: Emergency Medicine | Admitting: Emergency Medicine

## 2018-01-05 DIAGNOSIS — F419 Anxiety disorder, unspecified: Secondary | ICD-10-CM | POA: Insufficient documentation

## 2018-01-05 DIAGNOSIS — Z79899 Other long term (current) drug therapy: Secondary | ICD-10-CM | POA: Diagnosis not present

## 2018-01-05 DIAGNOSIS — R002 Palpitations: Secondary | ICD-10-CM | POA: Insufficient documentation

## 2018-01-05 DIAGNOSIS — F17210 Nicotine dependence, cigarettes, uncomplicated: Secondary | ICD-10-CM | POA: Diagnosis not present

## 2018-01-05 LAB — PREGNANCY, URINE: Preg Test, Ur: NEGATIVE

## 2018-01-05 NOTE — Discharge Instructions (Signed)
Your EKG looks normal today.   Schedule appointment with your primary care provider to follow-up on your visit today. It is also important to discuss your concerns about your prescribed medications for anxiety with your primary care provider. Return to the ER for severe chest pain, shortness of breath, or other concerning symptoms.

## 2018-01-05 NOTE — ED Provider Notes (Signed)
[facility] EMERGENCY DEPARTMENT Provider Note   CSN: [id number] Arrival date & time: 01/05/18  1528     History   Chief Complaint Chief Complaint  Patient presents with  . Palpitations    HPI Joan Thomas is a 32 y.o. female w PMHx anxiety, GERD, presenting to the ED with intermittent palpitations x 2 weeks.  Patient states she has had similar symptoms intermittently for multiple years, however she is feeling the palpitations more frequently these past 2 weeks.  She describes them as a single heart beat feels strong in her chest.  She states she does feel the symptoms are frequently when she is feeling anxious.  Does not take any medications for her anxiety, however is prescribed Zoloft (which she does not take).  Denies chest pain, shortness of breath, fever, or any other complaints.  States she drinks multiple caffeinated beverages per day.  The history is provided by the patient.    Past Medical History:  Diagnosis Date  . GERD (gastroesophageal reflux disease)     Patient Active Problem List   Diagnosis Date Noted  . Dysphagia   . Gastroesophageal reflux disease     Past Surgical History:  Procedure Laterality Date  . ECTOPIC PREGNANCY SURGERY    . ESOPHAGEAL MANOMETRY N/A 07/28/2017   Procedure: ESOPHAGEAL MANOMETRY (EM);  Surgeon: Napoleon FormNandigam, Kavitha V, MD;  Location: [facility];  Service: Endoscopy;  Laterality: N/A;  . PH IMPEDANCE STUDY N/A 07/28/2017   Procedure: PH IMPEDANCE STUDY;  Surgeon: Napoleon FormNandigam, Kavitha V, MD;  Location: [facility];  Service: Endoscopy;  Laterality: N/A;    OB History    Gravida Para Term Preterm AB Living   1             SAB TAB Ectopic Multiple Live Births                   Home Medications    Prior to Admission medications   Medication Sig Start Date End Date Taking? Authorizing Provider  omeprazole (PRILOSEC) 20 MG capsule Take 1 capsule (20 mg total) by mouth daily. Patient taking differently: Take 20 mg by  mouth 3 (three) times daily.  11/26/17  Yes Danis, Andreas BlowerHenry L III, MD  Probiotic Product (PROBIOTIC PO) Take 1 capsule daily   Yes [provider]    Family History Family History  Problem Relation Age of Onset  . Arthritis Maternal Grandmother   . GER disease Maternal Grandmother   . Diabetes Maternal Grandfather   . Heart disease Maternal Grandfather   . Prostate cancer Paternal Grandfather        several types  . Breast cancer Maternal Aunt   . Prostate cancer Maternal Uncle   . Alcohol abuse Neg Hx   . Asthma Neg Hx   . Birth defects Neg Hx   . COPD Neg Hx   . Depression Neg Hx   . Drug abuse Neg Hx   . Early death Neg Hx   . Hearing loss Neg Hx   . Hyperlipidemia Neg Hx   . Hypertension Neg Hx   . Kidney disease Neg Hx   . Learning disabilities Neg Hx   . Mental illness Neg Hx   . Mental retardation Neg Hx   . Miscarriages / Stillbirths Neg Hx   . Stroke Neg Hx   . Vision loss Neg Hx     Social History Social History   Tobacco Use  . Smoking status: Current Every Day Smoker  Packs/day: 0.50  . Smokeless tobacco: Never Used  . Tobacco comment: trying to quit  Substance Use Topics  . Alcohol use: Yes    Comment: weekly  . Drug use: No     Allergies   Patient has no known allergies.   Review of Systems Review of Systems  Constitutional: Negative for fever.  Respiratory: Negative for chest tightness and shortness of breath.   Cardiovascular: Positive for palpitations. Negative for chest pain and leg swelling.  All other systems reviewed and are negative.    Physical Exam Updated Vital Signs BP 123/86 (BP Location: Right Arm)   Pulse 76   Temp 98.4 F (36.9 C) (Oral)   Resp 18   Ht 5\' 4"  (1.626 m)   Wt 79.8 kg (176 lb)   LMP 12/20/2017   SpO2 99%   BMI 30.21 kg/m   Physical Exam  Constitutional: She appears well-developed and well-nourished. No distress.  HENT:  Head: Normocephalic and atraumatic.  Eyes: Conjunctivae are normal.    Neck: Normal range of motion. Neck supple.  Cardiovascular: Normal rate, regular rhythm, normal heart sounds and intact distal pulses.  Pulmonary/Chest: Effort normal and breath sounds normal. No stridor. No respiratory distress. She has no wheezes. She has no rales. She exhibits no tenderness.  Abdominal: Soft. Bowel sounds are normal. She exhibits no distension. There is no tenderness.  Musculoskeletal:  No LE edema or tenderness  Neurological: She is alert.  Skin: Skin is warm.  Psychiatric: She has a normal mood and affect. Her behavior is normal.  Nursing note and vitals reviewed.    ED Treatments / Results  Labs (all labs ordered are listed, but only abnormal results are displayed) Labs Reviewed  PREGNANCY, URINE    EKG  EKG Interpretation  Date/Time:  Monday January 05 2018 15:40:48 EDT Ventricular Rate:  73 PR Interval:  124 QRS Duration: 78 QT Interval:  378 QTC Calculation: 416 R Axis:   75 Text Interpretation:  Normal sinus rhythm with sinus arrhythmia Normal ECG since last tracing no significant change Confirmed by Rolan Bucco [phone]) on 01/05/2018 6:43:24 PM       Radiology No results found.  Procedures Procedures (including critical care time)  Medications Ordered in ED Medications - No data to display   Initial Impression / Assessment and Plan / ED Course  I have reviewed the triage vital signs and the nursing notes.  Pertinent labs & imaging results that were available during my care of the patient were reviewed by me and considered in my medical decision making (see chart for details).     Pt presenting to ED with intermittent palpitations. No CP, no SOB. Endorses hx of anxiety. Asymptomatic in ED. Normal heart and lung exam. EKG in NSR. Pregnancy neg. Recommend pt follow up with PCP regarding intermittent palpitations. Also recommend she decrease her caffeine intake. Strict return precautions discussed. Safe for discharge.  Discussed results,  findings, treatment and follow up. Patient advised of return precautions. Patient verbalized understanding and agreed with plan.  Final Clinical Impressions(s) / ED Diagnoses   Final diagnoses:  Palpitations  Anxiety    ED Discharge Orders    None       Reneka Nebergall, [place] N, PA-C 01/05/18 1923    Rolan Bucco, MD 01/05/18 [phone]

## 2018-01-05 NOTE — ED Triage Notes (Signed)
Heart palpitations x 2 weeks.

## 2018-01-06 ENCOUNTER — Telehealth: Payer: Self-pay | Admitting: Gastroenterology

## 2018-01-06 MED ORDER — OMEPRAZOLE 40 MG PO CPDR: 40.0000 mg | DELAYED_RELEASE_CAPSULE | Freq: Two times a day (BID) | ORAL | 3 refills | Status: DC

## 2018-01-06 NOTE — Telephone Encounter (Signed)
Called and spoke to Joan Thomas. She wants a new Rx for omeprazole 40 mg BID. She states this is helping her reflux symptoms the best. She has failed a low dose regimen as ordered back at her office visit with you. She saw [name] in Feb and she was told to increase her PPi back up as she was having so many problems. She has now ran out of her omeprazole due to increasing her dose. Please advise on refills.

## 2018-01-06 NOTE — Telephone Encounter (Signed)
I will send the prescription and try.  Insurance may not cover it at that dose, especially since very little reflux found on the pH study she had.

## 2018-08-02 ENCOUNTER — Encounter (HOSPITAL_BASED_OUTPATIENT_CLINIC_OR_DEPARTMENT_OTHER): Payer: Self-pay | Admitting: *Deleted

## 2018-08-02 ENCOUNTER — Emergency Department (HOSPITAL_BASED_OUTPATIENT_CLINIC_OR_DEPARTMENT_OTHER)
Admission: EM | Admit: 2018-08-02 | Discharge: 2018-08-02 | Disposition: A | Discharge status: Home / Self Care | Payer: 59 | Attending: Emergency Medicine | Admitting: Emergency Medicine

## 2018-08-02 ENCOUNTER — Other Ambulatory Visit: Payer: Self-pay

## 2018-08-02 DIAGNOSIS — S0990XA Unspecified injury of head, initial encounter: Secondary | ICD-10-CM | POA: Insufficient documentation

## 2018-08-02 DIAGNOSIS — Y92000 Kitchen of unspecified non-institutional (private) residence as  the place of occurrence of the external cause: Secondary | ICD-10-CM | POA: Insufficient documentation

## 2018-08-02 DIAGNOSIS — Y999 Unspecified external cause status: Secondary | ICD-10-CM | POA: Insufficient documentation

## 2018-08-02 DIAGNOSIS — Y939 Activity, unspecified: Secondary | ICD-10-CM | POA: Insufficient documentation

## 2018-08-02 DIAGNOSIS — Z5321 Procedure and treatment not carried out due to patient leaving prior to being seen by health care provider: Secondary | ICD-10-CM | POA: Insufficient documentation

## 2018-08-02 DIAGNOSIS — W19XXXA Unspecified fall, initial encounter: Secondary | ICD-10-CM | POA: Insufficient documentation

## 2018-08-02 NOTE — ED Notes (Signed)
In to room to assess pt. Pt not in room. Registration voiced seeing pt leave facility.

## 2018-08-02 NOTE — ED Triage Notes (Addendum)
Pt reports slipped and fell in kitchen and hit her head on the cabinets. Denies LOC. Admits to drinking etoh tonight. "a lot"

## 2018-08-02 NOTE — ED Notes (Signed)
Pt walked past EMT cleaning room 12 asking "Is my head bleeding?" EMT changed gloves and saw pt's head was bleeding and asked the pt to go back to her room to wait on the nurse. RN notified.

## 2018-08-23 ENCOUNTER — Emergency Department (HOSPITAL_BASED_OUTPATIENT_CLINIC_OR_DEPARTMENT_OTHER)
Admission: EM | Admit: 2018-08-23 | Discharge: 2018-08-23 | Disposition: A | Discharge status: Home / Self Care | Payer: Self-pay | Attending: Emergency Medicine | Admitting: Emergency Medicine

## 2018-08-23 ENCOUNTER — Other Ambulatory Visit: Payer: Self-pay

## 2018-08-23 ENCOUNTER — Encounter (HOSPITAL_BASED_OUTPATIENT_CLINIC_OR_DEPARTMENT_OTHER): Payer: Self-pay | Admitting: *Deleted

## 2018-08-23 ENCOUNTER — Emergency Department (HOSPITAL_BASED_OUTPATIENT_CLINIC_OR_DEPARTMENT_OTHER): Payer: Self-pay

## 2018-08-23 DIAGNOSIS — R0600 Dyspnea, unspecified: Secondary | ICD-10-CM | POA: Insufficient documentation

## 2018-08-23 DIAGNOSIS — R42 Dizziness and giddiness: Secondary | ICD-10-CM | POA: Insufficient documentation

## 2018-08-23 DIAGNOSIS — R112 Nausea with vomiting, unspecified: Secondary | ICD-10-CM | POA: Insufficient documentation

## 2018-08-23 DIAGNOSIS — R61 Generalized hyperhidrosis: Secondary | ICD-10-CM | POA: Insufficient documentation

## 2018-08-23 DIAGNOSIS — R0602 Shortness of breath: Secondary | ICD-10-CM | POA: Insufficient documentation

## 2018-08-23 DIAGNOSIS — F17210 Nicotine dependence, cigarettes, uncomplicated: Secondary | ICD-10-CM | POA: Insufficient documentation

## 2018-08-23 DIAGNOSIS — Z79899 Other long term (current) drug therapy: Secondary | ICD-10-CM | POA: Insufficient documentation

## 2018-08-23 LAB — COMPREHENSIVE METABOLIC PANEL
ALBUMIN: 4.2 g/dL (ref 3.5–5.0)
ALT: 18 U/L (ref 0–44)
AST: 24 U/L (ref 15–41)
Alkaline Phosphatase: 59 U/L (ref 38–126)
Anion gap: 11 (ref 5–15)
BUN: 9 mg/dL (ref 6–20)
CHLORIDE: 101 mmol/L (ref 98–111)
CO2: 25 mmol/L (ref 22–32)
Calcium: 9.5 mg/dL (ref 8.9–10.3)
Creatinine, Ser: 0.78 mg/dL (ref 0.44–1.00)
GFR calc Af Amer: >60 mL/min (ref >60)
Glucose, Bld: 108 mg/dL — ABNORMAL HIGH (ref 70–99)
POTASSIUM: 3.8 mmol/L (ref 3.5–5.1)
Sodium: 137 mmol/L (ref 135–145)
Total Bilirubin: 0.5 mg/dL (ref 0.3–1.2)
Total Protein: 7.7 g/dL (ref 6.5–8.1)

## 2018-08-23 LAB — CBC WITH DIFFERENTIAL/PLATELET
Abs Immature Granulocytes: 0.01 10*3/uL (ref 0.00–0.07)
BASOS ABS: 0 10*3/uL (ref 0.0–0.1)
Basophils Relative: 0 %
Eosinophils Absolute: 0.1 10*3/uL (ref 0.0–0.5)
Eosinophils Relative: 1 %
HCT: 40.7 % (ref 36.0–46.0)
HEMOGLOBIN: 13.1 g/dL (ref 12.0–15.0)
Immature Granulocytes: 0 %
LYMPHS PCT: 33 %
Lymphs Abs: 1.6 10*3/uL (ref 0.7–4.0)
MCH: 28.5 pg (ref 26.0–34.0)
MCHC: 32.2 g/dL (ref 30.0–36.0)
MCV: 88.5 fL (ref 80.0–100.0)
MONO ABS: 0.5 10*3/uL (ref 0.1–1.0)
Monocytes Relative: 9 %
Neutro Abs: 2.8 10*3/uL (ref 1.7–7.7)
Neutrophils Relative %: 57 %
Platelets: 247 10*3/uL (ref 150–400)
RBC: 4.6 MIL/uL (ref 3.87–5.11)
RDW: 14.4 % (ref 11.5–15.5)
WBC: 5 10*3/uL (ref 4.0–10.5)
nRBC: 0 % (ref 0.0–0.2)

## 2018-08-23 LAB — URINALYSIS, ROUTINE W REFLEX MICROSCOPIC
BILIRUBIN URINE: NEGATIVE
GLUCOSE, UA: NEGATIVE mg/dL
KETONES UR: NEGATIVE mg/dL
Leukocytes, UA: NEGATIVE
Nitrite: NEGATIVE
Protein, ur: NEGATIVE mg/dL
Specific Gravity, Urine: 1.02 (ref 1.005–1.030)
pH: 6 (ref 5.0–8.0)

## 2018-08-23 LAB — HCG, SERUM, QUALITATIVE: PREG SERUM: NEGATIVE

## 2018-08-23 LAB — URINALYSIS, MICROSCOPIC (REFLEX)

## 2018-08-23 LAB — TROPONIN I

## 2018-08-23 LAB — D-DIMER, QUANTITATIVE (NOT AT ARMC)

## 2018-08-23 MED ORDER — MECLIZINE HCL 25 MG PO TABS: 25.0000 mg | ORAL_TABLET | Freq: Three times a day (TID) | ORAL | 0 refills | Status: DC | PRN

## 2018-08-23 MED ORDER — SODIUM CHLORIDE 0.9 % IV BOLUS
1000.0000 mL | Freq: Once | INTRAVENOUS | Status: AC
  Administered 2018-08-23: 1000 mL via INTRAVENOUS

## 2018-08-23 NOTE — Discharge Instructions (Addendum)
Make sure you are drinking plenty of fluids.  Change positions slowly.  Return for any worsening of your symptoms or concerns.

## 2018-08-23 NOTE — ED Triage Notes (Addendum)
Pt reports episode of feeling short of breath 1 hour ago while cooking. States "I felt nauseous, like I was going to pass out". Reports she vomited x 1 and felt better after. Denies pain. Reports recent air travel to [facility] and returned last night

## 2018-08-23 NOTE — ED Provider Notes (Signed)
[facility] EMERGENCY DEPARTMENT Provider Note   CSN: [id number] Arrival date & time: 08/23/18  1554     History   Chief Complaint Chief Complaint  Patient presents with  . Shortness of Breath    HPI Joan Thomas is a 32 y.o. female.  HPI Patient states she was in her kitchen 1 hour prior to presentation.  She was cooking.  She became lightheaded feeling she was going to pass out.  This was associated with shortness of breath, diaphoresis and nausea.  She did vomit once.  She denied chest pain at any point.  Symptoms improved and now only complains of mild nausea.  States she has had a similar episode in the past.  Patient recently flew to [facility].  Denies any new lower extremity swelling or pain.  No personal family history of thromboembolic disease. Past Medical History:  Diagnosis Date  . GERD (gastroesophageal reflux disease)     Patient Active Problem List   Diagnosis Date Noted  . Dysphagia   . Gastroesophageal reflux disease     Past Surgical History:  Procedure Laterality Date  . ECTOPIC PREGNANCY SURGERY    . ESOPHAGEAL MANOMETRY N/A 07/28/2017   Procedure: ESOPHAGEAL MANOMETRY (EM);  Surgeon: Napoleon Form, MD;  Location: [facility];  Service: Endoscopy;  Laterality: N/A;  . PH IMPEDANCE STUDY N/A 07/28/2017   Procedure: PH IMPEDANCE STUDY;  Surgeon: Napoleon Form, MD;  Location: [facility];  Service: Endoscopy;  Laterality: N/A;     OB History    Gravida  1   Para      Term      Preterm      AB      Living        SAB      TAB      Ectopic      Multiple      Live Births               Home Medications    Prior to Admission medications   Medication Sig Start Date End Date Taking? Authorizing Provider  meclizine (ANTIVERT) 25 MG tablet Take 1 tablet (25 mg total) by mouth 3 (three) times daily as needed for dizziness. 08/23/18   Loren Racer, MD  omeprazole (PRILOSEC) 40 MG capsule Take 1 capsule (40  mg total) by mouth 2 (two) times daily before a meal. 01/06/18   Danis, Andreas Blower, MD  Probiotic Product (PROBIOTIC PO) Take 1 capsule daily    [provider]    Family History Family History  Problem Relation Age of Onset  . Arthritis Maternal Grandmother   . GER disease Maternal Grandmother   . Diabetes Maternal Grandfather   . Heart disease Maternal Grandfather   . Prostate cancer Paternal Grandfather        several types  . Breast cancer Maternal Aunt   . Prostate cancer Maternal Uncle   . Alcohol abuse Neg Hx   . Asthma Neg Hx   . Birth defects Neg Hx   . COPD Neg Hx   . Depression Neg Hx   . Drug abuse Neg Hx   . Early death Neg Hx   . Hearing loss Neg Hx   . Hyperlipidemia Neg Hx   . Hypertension Neg Hx   . Kidney disease Neg Hx   . Learning disabilities Neg Hx   . Mental illness Neg Hx   . Mental retardation Neg Hx   . Miscarriages /  Stillbirths Neg Hx   . Stroke Neg Hx   . Vision loss Neg Hx     Social History Social History   Tobacco Use  . Smoking status: Current Some Day Smoker    Packs/day: 0.50    Types: Cigarettes  . Smokeless tobacco: Never Used  . Tobacco comment: trying to quit  Substance Use Topics  . Alcohol use: Yes    Comment: weekly  . Drug use: No     Allergies   Patient has no known allergies.   Review of Systems Review of Systems  Constitutional: Positive for diaphoresis. Negative for chills and fever.  HENT: Negative for trouble swallowing.   Eyes: Negative for visual disturbance.  Respiratory: Positive for shortness of breath. Negative for chest tightness.   Cardiovascular: Negative for chest pain, palpitations and leg swelling.  Gastrointestinal: Positive for nausea and vomiting. Negative for abdominal pain, constipation and diarrhea.  Musculoskeletal: Negative for back pain, myalgias and neck pain.  Skin: Negative for rash and wound.  Neurological: Positive for dizziness and light-headedness. Negative for  syncope, weakness, numbness and headaches.  Psychiatric/Behavioral: The patient is nervous/anxious.   All other systems reviewed and are negative.    Physical Exam Updated Vital Signs BP 126/82   Pulse 70   Temp 98.3 F (36.8 C) (Oral)   Resp (!) 26   Ht 5\' 4"  (1.626 m)   Wt 81.6 kg   LMP 08/19/2018   SpO2 100%   BMI 30.90 kg/m   Physical Exam  Constitutional: She is oriented to person, place, and time. She appears well-developed and well-nourished. No distress.  HENT:  Head: Normocephalic and atraumatic.  Mouth/Throat: Oropharynx is clear and moist. No oropharyngeal exudate.  Eyes: Pupils are equal, round, and reactive to light. EOM are normal.  No nystagmus.  Neck: Normal range of motion. Neck supple. No JVD present.  Cardiovascular: Normal rate and regular rhythm. Exam reveals no gallop and no friction rub.  No murmur heard. Pulmonary/Chest: Effort normal and breath sounds normal. No stridor. No respiratory distress. She has no wheezes. She has no rales. She exhibits no tenderness.  Abdominal: Soft. Bowel sounds are normal. There is no tenderness. There is no rebound and no guarding.  Musculoskeletal: Normal range of motion. She exhibits no edema or tenderness.  No lower extremity swelling, asymmetry or tenderness.  No midline thoracic or lumbar tenderness.  No CVA tenderness.  Distal pulses are 2+.  Lymphadenopathy:    She has no cervical adenopathy.  Neurological: She is alert and oriented to person, place, and time.  Patient is alert and oriented x3 with clear, goal oriented speech. Patient has 5/5 motor in all extremities. Sensation is intact to light touch. Bilateral finger-to-nose is normal with no signs of dysmetria. Patient has a normal gait and walks without assistance.  Skin: Skin is warm and dry. Capillary refill takes less than 2 seconds. No rash noted. She is not diaphoretic. No erythema.  Psychiatric: She has a normal mood and affect. Her behavior is normal.    Nursing note and vitals reviewed.    ED Treatments / Results  Labs (all labs ordered are listed, but only abnormal results are displayed) Labs Reviewed  COMPREHENSIVE METABOLIC PANEL - Abnormal; Notable for the following components:      Result Value   Glucose, Bld 108 (*)    All other components within normal limits  URINALYSIS, ROUTINE W REFLEX MICROSCOPIC - Abnormal; Notable for the following components:   APPearance HAZY (*)  Hgb urine dipstick TRACE (*)    All other components within normal limits  URINALYSIS, MICROSCOPIC (REFLEX) - Abnormal; Notable for the following components:   Bacteria, UA MANY (*)    All other components within normal limits  CBC WITH DIFFERENTIAL/PLATELET  D-DIMER, QUANTITATIVE (NOT AT [facility])  TROPONIN I  HCG, SERUM, QUALITATIVE    EKG EKG Interpretation  Date/Time:  Sunday August 23 2018 16:08:58 EDT Ventricular Rate:  73 PR Interval:    QRS Duration: 84 QT Interval:  386 QTC Calculation: 426 R Axis:   63 Text Interpretation:  Sinus rhythm Borderline short PR interval Baseline wander in lead(s) V2 Confirmed by Arnette Driggs ([id number]) on 08/23/2018 4:50:28 PM   Radiology Dg Chest 2 View  Result Date: 08/23/2018 CLINICAL DATA:  Smoker; sob today; no fever; no h/o asthma; vomiting x1 today; pt felt lightheaded EXAM: CHEST - 2 VIEW COMPARISON:  03/26/2017 FINDINGS: The heart size and mediastinal contours are within normal limits. Both lungs are clear. The visualized skeletal structures are unremarkable. IMPRESSION: No active cardiopulmonary disease. Electronically Signed   By: Elizabeth  Brown M.D.   On: 08/23/2018 16:50    Procedures Procedures (including critical care time)  Medications Ordered in ED Medications  sodium chloride 0.9 % bolus 1,000 mL (1,000 mLs Intravenous New Bag/Given 08/23/18 1639)     Initial Impression / Assessment and Plan / ED Course  I have reviewed the triage vital signs and the nursing  notes.  Pertinent labs & imaging results that were available during my care of the patient were reviewed by me and considered in my medical decision making (see chart for details).     Laboratory work-up unremarkable.  Normal troponin and d-dimer.  EKG without concerning findings.  Patient states she is feeling better after IV fluids.  Question mild dehydration versus vertiginous symptoms.  Will give prescription for meclizine to use as needed.  Strict return precautions have been given.  Final Clinical Impressions(s) / ED Diagnoses   Final diagnoses:  Dizziness  Dyspnea, unspecified type    ED Discharge Orders         Ordered    meclizine (ANTIVERT) 25 MG tablet  3 times daily PRN     10 /27/19 1858           Loren Racer, MD 08/23/18 1859

## 2019-04-23 ENCOUNTER — Ambulatory Visit: Payer: Self-pay | Admitting: Family

## 2019-05-04 ENCOUNTER — Other Ambulatory Visit: Payer: Self-pay

## 2019-05-04 ENCOUNTER — Encounter: Payer: Self-pay | Admitting: Family

## 2019-05-04 ENCOUNTER — Ambulatory Visit: Payer: PRIVATE HEALTH INSURANCE | Admitting: Family

## 2019-05-04 VITALS — BP 133/91 | HR 84 | Temp 98.5°F | Resp 16 | Ht 65.0 in | Wt 179.0 lb

## 2019-05-04 DIAGNOSIS — K219 Gastro-esophageal reflux disease without esophagitis: Secondary | ICD-10-CM | POA: Diagnosis not present

## 2019-05-04 DIAGNOSIS — F419 Anxiety disorder, unspecified: Secondary | ICD-10-CM

## 2019-05-04 NOTE — Patient Instructions (Signed)
Welcome to [place]! 

## 2019-05-04 NOTE — Progress Notes (Signed)
Subjective:    Patient ID: Joan Thomas, female    DOB: 01-09-1986, 33 y.o.   MRN: [id number]  HPI  Patient is a 33 yr old female who presents today to establish care.   GERD- reports that she was initially evaluated in 2010.  Was referred to GI.  Was prescribed omeprazole for hiatal hernia.  Was told that manometry was normal.  Reports rare heartburn.   Anxiety-  Reports that she had she has daily symptoms.  Reports that she manages to deal with it.  Reports some difficulty sleeping.  2-3, nights a week she has trouble sleeping. She reports that she has done some counseling.     Review of Systems  Constitutional: Negative for unexpected weight change.  HENT: Positive for postnasal drip. Negative for rhinorrhea.   Eyes: Negative for visual disturbance.  Respiratory: Negative for cough and shortness of breath.   Cardiovascular: Negative for chest pain and leg swelling.  Gastrointestinal: Negative for constipation and diarrhea.  Genitourinary: Negative for dysuria, frequency and hematuria.  Musculoskeletal: Negative for arthralgias and myalgias.  Skin: Negative for rash.  Neurological: Negative for headaches.  Hematological: Negative for adenopathy.  Psychiatric/Behavioral:       See HPI     Past Medical History:  Diagnosis Date  . Anxiety   . GERD (gastroesophageal reflux disease)      Social History   Socioeconomic History  . Marital status: Married    Spouse name: Not on file  . Number of children: 0  . Years of education: Not on file  . Highest education level: Not on file  Occupational History  . Not on file  Social Needs  . Financial resource strain: Not on file  . Food insecurity    Worry: Never true    Inability: Never true  . Transportation needs    Medical: No    Non-medical: No  Tobacco Use  . Smoking status: Current Some Day Smoker    Packs/day: 0.50    Types: Cigarettes  . Smokeless tobacco: Never Used  . Tobacco comment: trying to quit   Substance and Sexual Activity  . Alcohol use: Yes    Alcohol/week: 7.0 standard drinks    Types: 5 Shots of liquor, 2 Glasses of wine per week    Comment: weekly  . Drug use: No  . Sexual activity: Yes    Partners: Male    Birth control/protection: None  Lifestyle  . Physical activity    Days per week: 0 days    Minutes per session: Not on file  . Stress: Not on file  Relationships  . Social connections    Talks on phone: More than three times a week    Gets together: Twice a week    Attends religious service: More than 4 times per year    Active member of club or organization: No    Attends meetings of clubs or organizations: Never    Relationship status: Married  . Intimate partner violence    Fear of current or ex partner: No    Emotionally abused: No    Physically abused: No    Forced sexual activity: No  Other Topics Concern  . Not on file  Social History Narrative  . Not on file    Past Surgical History:  Procedure Laterality Date  . ECTOPIC PREGNANCY SURGERY    . ESOPHAGEAL MANOMETRY N/A 07/28/2017   Procedure: ESOPHAGEAL MANOMETRY (EM);  Surgeon: Mauri Pole, MD;  Location: [facility];  Service: Endoscopy;  Laterality: N/A;  . PH IMPEDANCE STUDY N/A 07/28/2017   Procedure: PH IMPEDANCE STUDY;  Surgeon: Napoleon FormNandigam, Kavitha V, MD;  Location: [facility];  Service: Endoscopy;  Laterality: N/A;    Family History  Problem Relation Age of Onset  . Hypertension Mother   . Arthritis Maternal Grandmother   . GER disease Maternal Grandmother   . Diabetes Maternal Grandfather   . Heart disease Maternal Grandfather   . Prostate cancer Paternal Grandfather        several types  . Breast cancer Maternal Aunt   . Prostate cancer Maternal Uncle   . Alcohol abuse Neg Hx   . Asthma Neg Hx   . Birth defects Neg Hx   . COPD Neg Hx   . Depression Neg Hx   . Drug abuse Neg Hx   . Early death Neg Hx   . Hearing loss Neg Hx   . Hyperlipidemia Neg Hx   . Kidney  disease Neg Hx   . Learning disabilities Neg Hx   . Mental illness Neg Hx   . Mental retardation Neg Hx   . Miscarriages / Stillbirths Neg Hx   . Stroke Neg Hx   . Vision loss Neg Hx     No Known Allergies  No current outpatient medications on file prior to visit.   No current facility-administered medications on file prior to visit.     BP (!) 133/91 (BP Location: Right Arm, Patient Position: Sitting, Cuff Size: Small)   Pulse 84   Temp 98.5 F (36.9 C) (Oral)   Resp 16   Ht 5\' 5"  (1.651 m)   Wt 179 lb (81.2 kg)   SpO2 100%   BMI 29.79 kg/m        Objective:   Physical Exam Constitutional:      Appearance: She is well-developed.  Cardiovascular:     Rate and Rhythm: Normal rate and regular rhythm.     Heart sounds: Normal heart sounds. No murmur.  Pulmonary:     Effort: Pulmonary effort is normal. No respiratory distress.     Breath sounds: Normal breath sounds. No wheezing.  Psychiatric:        Behavior: Behavior normal.        Thought Content: Thought content normal.        Judgment: Judgment normal.           Assessment & Plan:  GERD- stable. Discussed GERD diet.   Anxiety- we discussed importance of good sleep hygiene, healthy diet and exercise. She seems to be coping OK right now. Will continue to monitor. Advised pt to let me know if she develops increased anxiety.

## 2019-05-07 ENCOUNTER — Encounter: Payer: Self-pay | Admitting: Family

## 2019-06-01 ENCOUNTER — Encounter: Payer: PRIVATE HEALTH INSURANCE | Admitting: Family

## 2019-07-13 ENCOUNTER — Encounter: Payer: Self-pay | Admitting: Family

## 2019-07-13 ENCOUNTER — Other Ambulatory Visit (HOSPITAL_COMMUNITY)
Admission: RE | Admit: 2019-07-13 | Discharge: 2019-07-13 | Disposition: A | Discharge status: Home / Self Care | Payer: PRIVATE HEALTH INSURANCE | Source: Ambulatory Visit | Attending: Family | Admitting: Family

## 2019-07-13 ENCOUNTER — Other Ambulatory Visit: Payer: Self-pay

## 2019-07-13 ENCOUNTER — Ambulatory Visit (INDEPENDENT_AMBULATORY_CARE_PROVIDER_SITE_OTHER): Payer: PRIVATE HEALTH INSURANCE | Admitting: Family

## 2019-07-13 VITALS — BP 124/87 | HR 81 | Temp 97.4°F | Resp 16 | Ht 64.0 in | Wt 179.0 lb

## 2019-07-13 DIAGNOSIS — G4700 Insomnia, unspecified: Secondary | ICD-10-CM | POA: Diagnosis not present

## 2019-07-13 DIAGNOSIS — Z01419 Encounter for gynecological examination (general) (routine) without abnormal findings: Secondary | ICD-10-CM | POA: Diagnosis present

## 2019-07-13 DIAGNOSIS — J309 Allergic rhinitis, unspecified: Secondary | ICD-10-CM | POA: Diagnosis not present

## 2019-07-13 DIAGNOSIS — Z0000 Encounter for general adult medical examination without abnormal findings: Secondary | ICD-10-CM

## 2019-07-13 DIAGNOSIS — R002 Palpitations: Secondary | ICD-10-CM

## 2019-07-13 MED ORDER — TRAZODONE HCL 50 MG PO TABS: 25.0000 mg | ORAL_TABLET | Freq: Every evening | ORAL | 3 refills | Status: DC | PRN

## 2019-07-13 MED ORDER — LORATADINE 10 MG PO TABS: 10.0000 mg | ORAL_TABLET | Freq: Every day | ORAL | 11 refills | Status: DC

## 2019-07-13 NOTE — Progress Notes (Signed)
Subjective:    Patient ID: Joan Thomas, female    DOB: 1986-06-18, 33 y.o.   MRN: [id number]  HPI  Patient presents today for complete physical.  Immunizations:  Reports that she had her tetanus in 2009, declines flu shot Wt Readings from Last 3 Encounters:  07/13/19 179 lb (81.2 kg)  05/04/19 179 lb (81.2 kg)  08/23/18 180 lb (81.6 kg)  Diet: reports diet is fair Exercise:  Not exercising Pap Smear: 2017  Taking melatonin 5mg  without improvement.  Reports clear sinus drainage, started when she started smoking more- (last year). Worse last few weeks.   Tobacco about 15 cigarettes/week   Review of Systems  Constitutional: Negative for unexpected weight change.  HENT: Negative for hearing loss.   Eyes: Negative for visual disturbance.  Respiratory: Negative for cough and shortness of breath.   Cardiovascular: Positive for palpitations (occasional palpitations). Negative for chest pain and leg swelling.  Gastrointestinal: Negative for constipation and diarrhea.  Genitourinary: Positive for menstrual problem (had a 10 day period this month). Negative for dysuria, frequency and hematuria.  Musculoskeletal: Negative for arthralgias and myalgias.  Skin: Negative for rash.  Neurological: Negative for headaches.  Hematological: Negative for adenopathy.  Psychiatric/Behavioral:       Denies depression   Past Medical History:  Diagnosis Date  . Anxiety   . GERD (gastroesophageal reflux disease)      Social History   Socioeconomic History  . Marital status: Married    Spouse name: Not on file  . Number of children: 0  . Years of education: Not on file  . Highest education level: Not on file  Occupational History  . Not on file  Social Needs  . Financial resource strain: Not on file  . Food insecurity    Worry: Never true    Inability: Never true  . Transportation needs    Medical: No    Non-medical: No  Tobacco Use  . Smoking status: Current Some Day Smoker     Packs/day: 0.50    Types: Cigarettes  . Smokeless tobacco: Never Used  . Tobacco comment: trying to quit  Substance and Sexual Activity  . Alcohol use: Yes    Alcohol/week: 7.0 standard drinks    Types: 5 Shots of liquor, 2 Glasses of wine per week    Comment: weekly  . Drug use: No  . Sexual activity: Yes    Partners: Male    Birth control/protection: None  Lifestyle  . Physical activity    Days per week: 0 days    Minutes per session: Not on file  . Stress: Not on file  Relationships  . Social connections    Talks on phone: More than three times a week    Gets together: Twice a week    Attends religious service: More than 4 times per year    Active member of club or organization: No    Attends meetings of clubs or organizations: Never    Relationship status: Married  . Intimate partner violence    Fear of current or ex partner: No    Emotionally abused: No    Physically abused: No    Forced sexual activity: No  Other Topics Concern  . Not on file  Social History Narrative   Worked for [employer] x 10 years, completed an Metallurgistaccounting degree online.     Left her recent job   Married to H[employer]R Blockafys Thomas   No children    Has a Nurse, mental healthdog  Family is out of town.    Enjoys cooking/reading.     Past Surgical History:  Procedure Laterality Date  . ECTOPIC PREGNANCY SURGERY    . ESOPHAGEAL MANOMETRY N/A 07/28/2017   Procedure: ESOPHAGEAL MANOMETRY (EM);  Surgeon: Napoleon Form, MD;  Location: [facility];  Service: Endoscopy;  Laterality: N/A;  . PH IMPEDANCE STUDY N/A 07/28/2017   Procedure: PH IMPEDANCE STUDY;  Surgeon: Napoleon Form, MD;  Location: [facility];  Service: Endoscopy;  Laterality: N/A;    Family History  Problem Relation Age of Onset  . Hypertension Mother   . Arthritis Maternal Grandmother   . GER disease Maternal Grandmother   . Diabetes Maternal Grandfather   . Heart disease Maternal Grandfather   . Sleep apnea Maternal Grandfather   .  Prostate cancer Paternal Grandfather        several types  . Breast cancer Maternal Aunt   . Prostate cancer Maternal Uncle   . Alcohol abuse Neg Hx   . Asthma Neg Hx   . Birth defects Neg Hx   . COPD Neg Hx   . Depression Neg Hx   . Drug abuse Neg Hx   . Early death Neg Hx   . Hearing loss Neg Hx   . Hyperlipidemia Neg Hx   . Kidney disease Neg Hx   . Learning disabilities Neg Hx   . Mental illness Neg Hx   . Mental retardation Neg Hx   . Miscarriages / Stillbirths Neg Hx   . Stroke Neg Hx   . Vision loss Neg Hx     No Known Allergies  No current outpatient medications on file prior to visit.   No current facility-administered medications on file prior to visit.     BP 124/87 (BP Location: Right Arm, Patient Position: Sitting, Cuff Size: Small)   Pulse 81   Temp (!) 97.4 F (36.3 C) (Temporal)   Resp 16   Ht 5\' 4"  (1.626 m)   Wt 179 lb (81.2 kg)   SpO2 100%   BMI 30.73 kg/m       Objective:   Physical Exam  Physical Exam  Constitutional: She is oriented to person, place, and time. She appears well-developed and well-nourished. No distress.  HENT:  Head: Normocephalic and atraumatic.  Right Ear: Tympanic membrane and ear canal normal.  Left Ear: Tympanic membrane and ear canal normal.  Mouth/Throat: Not examined Eyes: Pupils are equal, round, and reactive to light. No scleral icterus.  Neck: Normal range of motion. No thyromegaly present.  Cardiovascular: Normal rate and regular rhythm.   No murmur heard. Pulmonary/Chest: Effort normal and breath sounds normal. No respiratory distress. He has no wheezes. She has no rales. She exhibits no tenderness.  Abdominal: Soft. Bowel sounds are normal. She exhibits no distension and no mass. There is no tenderness. There is no rebound and no guarding.  Musculoskeletal: She exhibits no edema.  Lymphadenopathy:    She has no cervical adenopathy.  Neurological: She is alert and oriented to person, place, and time. She  has normal patellar reflexes. She exhibits normal muscle tone. Coordination normal.  Skin: Skin is warm and dry.  Psychiatric: She has a normal mood and affect. Her behavior is normal. Judgment and thought content normal.  Breasts: Examined lying Right: Without masses, retractions, discharge or axillary adenopathy.  Left: Without masses, retractions, discharge or axillary adenopathy.  Inguinal/mons: Normal without inguinal adenopathy  External genitalia: Normal  BUS/Urethra/Skene's glands: Normal  Bladder: Normal  Vagina: Normal  Cervix: Normal  Uterus: normal in size, shape and contour. Midline and mobile  Adnexa/parametria:  Rt: Without masses or tenderness.  Lt: Without masses or tenderness.  Anus and perineum: Normal            Assessment & Plan:  Preventative care- encouraged smoking cessation. Pap performed. Obtain routine lab work.   Insomnia- trial of trazodone.   Palpitations- EKG tracing is personally reviewed.  EKG notes NSR.  No acute changes. Note is made of borderline short PR interval which is not new when compared to old EKG's. Check lab work. Suspect anxiety is a contributing factor.  Allergic rhinitis- add claritin 10mg  once daily.  Assessment & Plan:

## 2019-07-13 NOTE — Patient Instructions (Signed)
Please work on quitting smoking. Work on [employer], exercise, weight loss.  Start trazodone 1/2 tab at bedtime for sleep.

## 2019-07-14 LAB — BASIC METABOLIC PANEL
BUN: 9 mg/dL (ref 6–23)
CO2: 28 mEq/L (ref 19–32)
Calcium: 9.6 mg/dL (ref 8.4–10.5)
Chloride: 99 mEq/L (ref 96–112)
Creatinine, Ser: 0.83 mg/dL (ref 0.40–1.20)
GFR: 95.12 mL/min (ref >60.00)
Glucose, Bld: 90 mg/dL (ref 70–99)
Potassium: 4.4 mEq/L (ref 3.5–5.1)
Sodium: 136 mEq/L (ref 135–145)

## 2019-07-14 LAB — HEPATIC FUNCTION PANEL
ALT: 17 U/L (ref 0–35)
AST: 20 U/L (ref 0–37)
Albumin: 4.2 g/dL (ref 3.5–5.2)
Alkaline Phosphatase: 57 U/L (ref 39–117)
Bilirubin, Direct: 0.1 mg/dL (ref 0.0–0.3)
Total Bilirubin: 0.5 mg/dL (ref 0.2–1.2)
Total Protein: 7.3 g/dL (ref 6.0–8.3)

## 2019-07-14 LAB — CBC WITH DIFFERENTIAL/PLATELET
Basophils Absolute: 0.1 10*3/uL (ref 0.0–0.1)
Basophils Relative: 1.3 % (ref 0.0–3.0)
Eosinophils Absolute: 0.1 10*3/uL (ref 0.0–0.7)
Eosinophils Relative: 1.7 % (ref 0.0–5.0)
HCT: 37.7 % (ref 36.0–46.0)
Hemoglobin: 12.5 g/dL (ref 12.0–15.0)
Lymphocytes Relative: 30.3 % (ref 12.0–46.0)
Lymphs Abs: 1.8 10*3/uL (ref 0.7–4.0)
MCHC: 33.2 g/dL (ref 30.0–36.0)
MCV: 90 fl (ref 78.0–100.0)
Monocytes Absolute: 0.6 10*3/uL (ref 0.1–1.0)
Monocytes Relative: 9.4 % (ref 3.0–12.0)
Neutro Abs: 3.4 10*3/uL (ref 1.4–7.7)
Neutrophils Relative %: 57.3 % (ref 43.0–77.0)
Platelets: 243 10*3/uL (ref 150.0–400.0)
RBC: 4.19 Mil/uL (ref 3.87–5.11)
RDW: 14.2 % (ref 11.5–15.5)
WBC: 5.9 10*3/uL (ref 4.0–10.5)

## 2019-07-14 LAB — LIPID PANEL
Cholesterol: 205 mg/dL — ABNORMAL HIGH (ref 0–200)
HDL: 102.6 mg/dL (ref >39.00)
LDL Cholesterol: 82 mg/dL (ref 0–99)
NonHDL: 102.11
Total CHOL/HDL Ratio: 2
Triglycerides: 99 mg/dL (ref 0.0–149.0)
VLDL: 19.8 mg/dL (ref 0.0–40.0)

## 2019-07-14 LAB — TSH: TSH: 2.16 u[IU]/mL (ref 0.35–4.50)

## 2019-07-15 ENCOUNTER — Encounter: Payer: Self-pay | Admitting: Family

## 2019-07-16 ENCOUNTER — Encounter: Payer: Self-pay | Admitting: Family

## 2019-07-20 ENCOUNTER — Telehealth: Payer: Self-pay | Admitting: Family

## 2019-07-20 DIAGNOSIS — B977 Papillomavirus as the cause of diseases classified elsewhere: Secondary | ICD-10-CM | POA: Insufficient documentation

## 2019-07-20 LAB — CYTOLOGY - PAP
Adequacy: ABSENT
Diagnosis: NEGATIVE
High risk HPV: POSITIVE — AB
Molecular Disclaimer: 56
Molecular Disclaimer: DETECTED
Molecular Disclaimer: NORMAL

## 2019-07-20 NOTE — Telephone Encounter (Signed)
Please advise pt that I reviewed her Pap. Note is made of + High risk HPV. I would like to refer her to GYN for further evaluation.  They may do additional testing.

## 2019-07-21 ENCOUNTER — Ambulatory Visit (INDEPENDENT_AMBULATORY_CARE_PROVIDER_SITE_OTHER): Payer: PRIVATE HEALTH INSURANCE | Admitting: Family

## 2019-07-21 ENCOUNTER — Other Ambulatory Visit: Payer: Self-pay

## 2019-07-21 DIAGNOSIS — B977 Papillomavirus as the cause of diseases classified elsewhere: Secondary | ICD-10-CM

## 2019-07-21 DIAGNOSIS — G4700 Insomnia, unspecified: Secondary | ICD-10-CM | POA: Diagnosis not present

## 2019-07-21 DIAGNOSIS — F419 Anxiety disorder, unspecified: Secondary | ICD-10-CM | POA: Diagnosis not present

## 2019-07-21 NOTE — Progress Notes (Signed)
Virtual Visit via Video Note  I connected with Joan Thomas on 07/21/19 at 12:20 PM EDT by a video enabled telemedicine application and verified that I am speaking with the correct person using two identifiers.  Location: Patient: home Provider: work    I discussed the limitations of evaluation and management by telemedicine and the availability of in person appointments. The patient expressed understanding and agreed to proceed.  History of Present Illness:  Insomnia- last week we gave her a trial of trazodone for sleep.  She states that she did not start yet due to wanting to wait on her labs.   High risk HPV- noted on most recent pap.   Anxiety- reports that anxiety has been better.    Past Medical History:  Diagnosis Date  . Anxiety   . GERD (gastroesophageal reflux disease)      Social History   Socioeconomic History  . Marital status: Married    Spouse name: Not on file  . Number of children: 0  . Years of education: Not on file  . Highest education level: Not on file  Occupational History  . Not on file  Social Needs  . Financial resource strain: Not on file  . Food insecurity    Worry: Never true    Inability: Never true  . Transportation needs    Medical: No    Non-medical: No  Tobacco Use  . Smoking status: Current Some Day Smoker    Packs/day: 0.50    Types: Cigarettes  . Smokeless tobacco: Never Used  . Tobacco comment: trying to quit  Substance and Sexual Activity  . Alcohol use: Yes    Alcohol/week: 7.0 standard drinks    Types: 5 Shots of liquor, 2 Glasses of wine per week    Comment: weekly  . Drug use: No  . Sexual activity: Yes    Partners: Male    Birth control/protection: None  Lifestyle  . Physical activity    Days per week: 0 days    Minutes per session: Not on file  . Stress: Not on file  Relationships  . Social connections    Talks on phone: More than three times a week    Gets together: Twice a week    Attends religious  service: More than 4 times per year    Active member of club or organization: No    Attends meetings of clubs or organizations: Never    Relationship status: Married  . Intimate partner violence    Fear of current or ex partner: No    Emotionally abused: No    Physically abused: No    Forced sexual activity: No  Other Topics Concern  . 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     Left her recent job   Married to [place]   No children    Has a dog   Family is out of town.    Enjoys cooking/reading.     Past Surgical History:  Procedure Laterality Date  . ECTOPIC PREGNANCY SURGERY    . ESOPHAGEAL MANOMETRY N/A 07/28/2017   Procedure: ESOPHAGEAL MANOMETRY (EM);  Surgeon: Mauri Pole, MD;  Location: [facility];  Service: Endoscopy;  Laterality: N/A;  . [place] IMPEDANCE STUDY N/A 07/28/2017   Procedure: [place] IMPEDANCE STUDY;  Surgeon: Mauri Pole, MD;  Location: [facility];  Service: Endoscopy;  Laterality: N/A;    Family History  Problem Relation Age of Onset  . Hypertension Mother   . Arthritis Maternal Grandmother   . GER disease Maternal Grandmother   . Gout Maternal Grandmother   . Osteoporosis Maternal Grandmother   . Diabetes Maternal Grandfather   . Heart disease Maternal Grandfather   . Sleep apnea Maternal Grandfather   . Kidney failure Paternal Grandmother   . Breast cancer Paternal Grandmother   . COPD Paternal Grandmother   . Prostate cancer Paternal Grandfather        several types  . Breast cancer Maternal Aunt   . Prostate cancer Maternal Uncle   . Hypertension Sister        thrombocytosis  . Alcohol abuse Neg Hx   . Asthma Neg Hx   . Birth defects Neg Hx   . Depression Neg Hx   . Drug abuse Neg Hx   . Early death Neg Hx   . Hearing loss Neg Hx   . Hyperlipidemia Neg Hx   . Kidney disease Neg Hx   . Learning disabilities Neg Hx   . Mental illness Neg Hx    . Mental retardation Neg Hx   . Miscarriages / Stillbirths Neg Hx   . Stroke Neg Hx   . Vision loss Neg Hx     No Known Allergies  Current Outpatient Medications on File Prior to Visit  Medication Sig Dispense Refill  . loratadine (CLARITIN) 10 MG tablet Take 1 tablet (10 mg total) by mouth daily. 30 tablet 11  . traZODone (DESYREL) 50 MG tablet Take 0.5-1 tablets (25-50 mg total) by mouth at bedtime as needed for sleep. 30 tablet 3   No current facility-administered medications on file prior to visit.     There were no vitals taken for this visit.    Observations/Objective:   Gen: Awake, alert, no acute distress Resp: Breathing is even and non-labored Psych: calm/pleasant demeanor Neuro: Alert and Oriented x 3, + facial symmetry, speech is clear.   Assessment and Plan:  Insomnia-unchanged. Advised pt to start trial of trazodone and to send me a message via mychart in about 2 weeks to let me know how it is going.  High risk HPV- new. Will refer to GYN.  Pt is agreeable to referral.   Anxiety- Improved. Monitor.   Follow Up Instructions:    I discussed the assessment and treatment plan with the patient. The patient was provided an opportunity to ask questions and all were answered. The patient agreed with the plan and demonstrated an understanding of the instructions.   The patient was advised to call back or seek an in-person evaluation if the symptoms worsen or if the condition fails to improve as anticipated.  Lemont Fillers, NP

## 2019-07-21 NOTE — Telephone Encounter (Signed)
Reviewed results with pt at her virtual visit today.

## 2019-07-26 ENCOUNTER — Encounter: Payer: Self-pay | Admitting: Family

## 2019-08-19 ENCOUNTER — Encounter: Payer: PRIVATE HEALTH INSURANCE | Admitting: Family Medicine

## 2019-08-26 ENCOUNTER — Ambulatory Visit (INDEPENDENT_AMBULATORY_CARE_PROVIDER_SITE_OTHER): Payer: PRIVATE HEALTH INSURANCE | Admitting: Family Medicine

## 2019-08-26 ENCOUNTER — Encounter: Payer: Self-pay | Admitting: Family Medicine

## 2019-08-26 ENCOUNTER — Other Ambulatory Visit (HOSPITAL_COMMUNITY)
Admission: RE | Admit: 2019-08-26 | Discharge: 2019-08-26 | Disposition: A | Discharge status: Home / Self Care | Payer: PRIVATE HEALTH INSURANCE | Source: Ambulatory Visit | Attending: Family Medicine | Admitting: Family Medicine

## 2019-08-26 ENCOUNTER — Other Ambulatory Visit: Payer: Self-pay

## 2019-08-26 VITALS — BP 131/90 | HR 80 | Wt 185.0 lb

## 2019-08-26 DIAGNOSIS — B977 Papillomavirus as the cause of diseases classified elsewhere: Secondary | ICD-10-CM

## 2019-08-26 DIAGNOSIS — R87810 Cervical high risk human papillomavirus (HPV) DNA test positive: Secondary | ICD-10-CM | POA: Insufficient documentation

## 2019-08-26 DIAGNOSIS — Z3202 Encounter for pregnancy test, result negative: Secondary | ICD-10-CM

## 2019-08-26 DIAGNOSIS — Z01812 Encounter for preprocedural laboratory examination: Secondary | ICD-10-CM

## 2019-08-26 LAB — POCT URINE PREGNANCY: Preg Test, Ur: NEGATIVE

## 2019-08-26 NOTE — Patient Instructions (Signed)
Colpocleisis, Care After This sheet gives you information about how to care for yourself after your procedure. Your health care provider may also give you more specific instructions. If you have problems or questions, contact your health care provider. What can I expect after the procedure? After the procedure, it is common to have:  Vaginal discomfort that can be relieved by pain medicines.  Vaginal spotting. Follow these instructions at home: Managing pain and discomfort   You may take warm sitz baths 2-3 times a day to reduce swelling and discomfort. ? Fill the bathtub half full with warm water. ? Sit in the water and open the drain a little. ? Turn on the warm water to keep the tub half full. Keep the water running constantly. ? Soak in the water for 15-20 minutes. ? After the sitz bath, pat the affected area dry first. Incision care  Follow instructions from your health care provider about how to take care of your incision. Make sure you: ? Wash your hands with soap and water before you change your bandage (dressing). If soap and water are not available, use hand sanitizer. ? Change your dressing as told by your health care provider. ? Leave stitches (sutures), skin glue, or adhesive strips in place. These skin closures may need to stay in place for 2 weeks or longer. If adhesive strip edges start to loosen and curl up, you may trim the loose edges. Do not remove adhesive strips completely unless your health care provider tells you to do that.  Check your incision area every day for signs of infection. Check for: ? Redness, swelling, or pain. ? Fluid or blood. ? Warmth. ? Pus or a bad smell. Medicines  Take over-the-counter and prescription medicines only as told by your health care provider.  Do not take aspirin. It can cause bleeding.  If you were prescribed an antibiotic medicine, use it as told by your health care provider. Do not stop using the antibiotic even if you  start to feel better.  Do not drive or use heavy machinery while taking prescription pain medicine. General instructions   Follow instructions from your health care provider about diet, rest, driving, and general activities.  Do not lift anything that is heavier than 5 lb (2.3 kg) until your health care provider says that it is safe.  To prevent or treat constipation while you are taking prescription pain medicine, your health care provider may recommend that you: ? Drink enough fluid to keep your urine clear or pale yellow. ? Take over-the-counter or prescription medicines. ? Eat foods that are high in fiber, such as fresh fruits and vegetables, whole grains, and beans. ? Limit foods that are high in fat and processed sugars, such as fried and sweet foods.  Keep all follow-up visits as told by your health care provider. This is important. Contact a health care provider if:  You have a fever.  You have bloody or painful urination.  You have abdominal pain.  You have redness, swelling, or pain in the incision area.  You have fluid or blood coming from the incision area.  Your incision area feels warm to the touch.  You have pus or a bad smell coming from the incision area.  You develop a rash.  You think the sutures in your incision are breaking.  You have nausea or diarrhea.  You become constipated. Get help right away if:  You develop shortness of breath.  You develop leg or chest pain.  You faint. Summary  After the procedure, it is common to have vaginal discomfort that can be relieved by pain medicines.  You may take warm sitz baths 2-3 times a day to reduce swelling and discomfort.  Do not lift anything that is heavier than 5 lb (2.3 kg) until your health care provider says that it is safe.  Follow instructions from your health care provider about diet, rest, driving, and general activities. This information is not intended to replace advice given to you  by your health care provider. Make sure you discuss any questions you have with your health care provider. Document Released: 08/04/2013 Document Revised: 09/26/2017 Document Reviewed: 10/23/2016 Elsevier Patient Education  2020 [employer].

## 2019-08-26 NOTE — Progress Notes (Signed)
Patient Name: Joan Thomas, female   DOB: 1986-08-14, 33 y.o.  MRN: [id number]  Colposcopy Procedure Note:  G1P0 Pregnancy status: Unknown Indications: + HPV HPV:  High Risk Cervical History:  Previous Abnormal Pap: yes (unkown)  Previous Colposcopy: yes  Previous LEEP or Cryo: yes (2010  Smoking: Current Hysterectomy: No   Patient given informed consent, signed copy in the chart, time out was performed.    Exam: Vulva and Vagina grossly normal.  Cervix viewed with speculum and colposcope after application of acetic acid:  Cervix Fully Visualized Squamocolumnar Junction Visibility: Fully visualized  Acetowhite lesions: none o'clock  Other Lesions: None Punctation: Not present  Mosaicism: Not present Abnormal vasculature: No   Biopsies: none ECC: yes  Hemostasis achieved with:  n/a  Colposcopy Impression:  Benign   Patient was given post procedure instructions.  Will call patient with results.

## 2019-08-26 NOTE — Progress Notes (Signed)
Patient states she had two periods last month. Kathrene Alu RN

## 2019-08-30 ENCOUNTER — Telehealth: Payer: PRIVATE HEALTH INSURANCE

## 2019-08-30 ENCOUNTER — Other Ambulatory Visit: Payer: Self-pay

## 2019-08-30 LAB — SURGICAL PATHOLOGY

## 2019-08-31 ENCOUNTER — Ambulatory Visit: Payer: PRIVATE HEALTH INSURANCE | Admitting: Family

## 2019-08-31 ENCOUNTER — Encounter: Payer: Self-pay | Admitting: Family

## 2019-08-31 VITALS — BP 153/94 | HR 71 | Temp 97.4°F | Resp 16 | Ht 64.0 in | Wt 179.0 lb

## 2019-08-31 DIAGNOSIS — F419 Anxiety disorder, unspecified: Secondary | ICD-10-CM | POA: Diagnosis not present

## 2019-08-31 DIAGNOSIS — R030 Elevated blood-pressure reading, without diagnosis of hypertension: Secondary | ICD-10-CM

## 2019-08-31 MED ORDER — FLUOXETINE HCL 10 MG PO TABS: ORAL_TABLET | ORAL | 0 refills | Status: DC

## 2019-08-31 MED ORDER — ALPRAZOLAM 0.25 MG PO TABS: 0.2500 mg | ORAL_TABLET | Freq: Two times a day (BID) | ORAL | 0 refills | Status: DC | PRN

## 2019-08-31 NOTE — Progress Notes (Signed)
Subjective:    Patient ID: Joan Thomas, female    DOB: 11/09/1985, 33 y.o.   MRN: [id number]  HPI  Patient is a 33 yr old female who presents today with chief complaint of bilateral ankle swelling. Symptoms started 10 days ago. Reports that this lasted for 3 days. Now seems improved.  She denies calf pain. [place].  Reports that her symptoms of anxiety and started before her trip on 10/22.  Denies calf pain or current calf swelling.  Anxiety- reports that she is worrying a lot.   Denies any symptoms of depression.  Last drink was on Sunday, prior to that she was drinking 3-4 (bud light and vodka a day). Denies severe shortness of breath but feels like she has the sensation to yawn and if she can't yawn, "I panic."      BP Readings from Last 3 Encounters:  08/31/19 (!) 153/94  08/26/19 131/90  07/13/19 124/87   Reports that she did not feel well on the trazodone.  Feels that her anxiety is worse.  Has sensation that she has to yawn.      Review of Systems   Past Medical History:  Diagnosis Date  . Anxiety   . GERD (gastroesophageal reflux disease)   . Vaginal Pap smear, abnormal      Social History   Socioeconomic History  . Marital status: Married    Spouse name: Not on file  . Number of children: 0  . Years of education: Not on file  . Highest education level: Not on file  Occupational History  . Not on file  Social Needs  . Financial resource strain: Not on file  . Food insecurity    Worry: Never true    Inability: Never true  . Transportation needs    Medical: No    Non-medical: No  Tobacco Use  . Smoking status: Current Some Day Smoker    Packs/day: 0.50    Types: Cigarettes  . Smokeless tobacco: Never Used  . Tobacco comment: trying to quit  Substance and Sexual Activity  . Alcohol use: Yes    Alcohol/week: 7.0 standard drinks    Types: 2 Glasses of wine, 5 Shots of liquor per week    Comment: weekly   . Drug use: No  . Sexual activity: Yes    Partners: Male    Birth control/protection: None  Lifestyle  . Physical activity    Days per week: 0 days    Minutes per session: Not on file  . Stress: Not on file  Relationships  . Social connections    Talks on phone: More than three times a week    Gets together: Twice a week    Attends religious service: More than 4 times per year    Active member of club or organization: No    Attends meetings of clubs or organizations: Never    Relationship status: Married  . Intimate partner violence    Fear of current or ex partner: No    Emotionally abused: No    Physically abused: No    Forced sexual activity: No  Other Topics Concern  . Not on file  Social History Narrative   Worked for [employer] x 10 years, completed an Metallurgistaccounting degree online.     Left her recent job   Married to [employer] [place]   No children    Has a dog   Family is out  of town.    Enjoys cooking/reading.     Past Surgical History:  Procedure Laterality Date  . ECTOPIC PREGNANCY SURGERY    . ESOPHAGEAL MANOMETRY N/A 07/28/2017   Procedure: ESOPHAGEAL MANOMETRY (EM);  Surgeon: Napoleon Form, MD;  [facility];  Service: Endoscopy;  Laterality: N/A;  . [place] IMPEDANCE STUDY N/A 07/28/2017   Procedure: [place] IMPEDANCE STUDY;  Surgeon: Napoleon Form, MD;  Location: [facility];  Service: Endoscopy;  Laterality: N/A;    Family History  Problem Relation Age of Onset  . Hypertension Mother   . Arthritis Maternal Grandmother   . GER disease Maternal Grandmother   . Gout Maternal Grandmother   . Osteoporosis Maternal Grandmother   . Diabetes Maternal Grandfather   . Heart disease Maternal Grandfather   . Sleep apnea Maternal Grandfather   . Kidney failure Paternal Grandmother   . Breast cancer Paternal Grandmother   . COPD Paternal Grandmother   . Prostate cancer Paternal Grandfather        several types  . Breast cancer Maternal Aunt   .  Prostate cancer Maternal Uncle   . Hypertension Sister        thrombocytosis  . Alcohol abuse Neg Hx   . Asthma Neg Hx   . Birth defects Neg Hx   . Depression Neg Hx   . Drug abuse Neg Hx   . Early death Neg Hx   . Hearing loss Neg Hx   . Hyperlipidemia Neg Hx   . Kidney disease Neg Hx   . Learning disabilities Neg Hx   . Mental illness Neg Hx   . Mental retardation Neg Hx   . Miscarriages / Stillbirths Neg Hx   . Stroke Neg Hx   . Vision loss Neg Hx     No Known Allergies  Current Outpatient Medications on File Prior to Visit  Medication Sig Dispense Refill  . loratadine (CLARITIN) 10 MG tablet Take 1 tablet (10 mg total) by mouth daily. 30 tablet 11   No current facility-administered medications on file prior to visit.     BP (!) 153/94 (BP Location: Right Arm, Patient Position: Sitting, Cuff Size: Small)   Pulse 71   Temp (!) 97.4 F (36.3 C) (Temporal)   Resp 16   Ht 5\' 4"  (1.626 m)   Wt 179 lb (81.2 kg)   SpO2 100%   BMI 30.73 kg/m        Objective:   Physical Exam Constitutional:      Appearance: She is well-developed.  Neck:     Musculoskeletal: Neck supple.     Thyroid: No thyromegaly.  Cardiovascular:     Rate and Rhythm: Normal rate and regular rhythm.     Heart sounds: Normal heart sounds. No murmur.  Pulmonary:     Effort: Pulmonary effort is normal. No respiratory distress.     Breath sounds: Normal breath sounds. No wheezing.  Musculoskeletal:     Right lower leg: No edema.     Left lower leg: No edema.  Skin:    General: Skin is warm and dry.  Neurological:     Mental Status: She is alert and oriented to person, place, and time.  Psychiatric:        Behavior: Behavior normal.        Thought Content: Thought content normal.        Judgment: Judgment normal.           Assessment & Plan:  Anxiety-  uncontrolled. Scored 18 on GAD-7.  Will rx with prozac 10mg .  I instructed pt to start 1/2 tablet once daily for 1 week and then  increase to a full tablet once daily on week two as tolerated.  We discussed common side effects such as nausea, drowsiness and weight gain. Will also add xanax short term to be used prn. She is advised not to drive after taking or to mix with alcohol.  Pt verbalizes understanding. I have asked her to send me a message in 3-4 days to update me on her progress.  Plan follow up in 1 month to evaluate progress.    Elevated blood pressure reading- will plan to recheck in 1 month.

## 2019-08-31 NOTE — Patient Instructions (Signed)
Please begin prozac 1/2 tablet at bedtime for 1 week, then increase to a full tab once daily on week two. You may use xanax as needed for anxiety. Go to the ER if you develop severe chest pain or shortness of breath.

## 2019-09-07 ENCOUNTER — Encounter: Payer: Self-pay | Admitting: Family

## 2019-09-15 ENCOUNTER — Encounter: Payer: Self-pay | Admitting: Family

## 2019-09-16 MED ORDER — ALPRAZOLAM 0.25 MG PO TABS: 0.2500 mg | ORAL_TABLET | Freq: Two times a day (BID) | ORAL | 0 refills | Status: DC | PRN

## 2019-09-16 MED ORDER — FLUOXETINE HCL 20 MG PO TABS: 20.0000 mg | ORAL_TABLET | Freq: Every day | ORAL | 0 refills | Status: DC

## 2019-10-15 ENCOUNTER — Telehealth: Payer: Self-pay | Admitting: Family

## 2019-10-15 ENCOUNTER — Encounter: Payer: Self-pay | Admitting: Family

## 2019-10-15 MED ORDER — FLUOXETINE HCL 20 MG PO TABS: 20.0000 mg | ORAL_TABLET | Freq: Every day | ORAL | 0 refills | Status: DC

## 2019-10-15 NOTE — Telephone Encounter (Signed)
Message sent to provider on this

## 2019-10-15 NOTE — Telephone Encounter (Signed)
She is due for follow up. I would like to see her back, I gave a refill of prozac. Need follow up before I can refill xanax.

## 2019-10-15 NOTE — Telephone Encounter (Signed)
Message sent to provider on this earlier today

## 2019-10-15 NOTE — Telephone Encounter (Signed)
RX REFILL  FLUoxetine (PROZAC) 20 MG  ALPRAZolam Duanne Moron) 0.25 MG tablet    [place], [place] Phone:  [phone]  Fax:  [phone]      Patient threw away medication in trash and requesting PCP to go away and refill medication Call back [phone]

## 2019-10-18 NOTE — Telephone Encounter (Signed)
Patient was scheduled for a virtual follow up tomorrow

## 2019-10-19 ENCOUNTER — Other Ambulatory Visit: Payer: Self-pay

## 2019-10-19 ENCOUNTER — Ambulatory Visit (INDEPENDENT_AMBULATORY_CARE_PROVIDER_SITE_OTHER): Payer: PRIVATE HEALTH INSURANCE | Admitting: Family

## 2019-10-19 ENCOUNTER — Encounter: Payer: Self-pay | Admitting: Family

## 2019-10-19 DIAGNOSIS — R030 Elevated blood-pressure reading, without diagnosis of hypertension: Secondary | ICD-10-CM

## 2019-10-19 DIAGNOSIS — F419 Anxiety disorder, unspecified: Secondary | ICD-10-CM

## 2019-10-19 MED ORDER — ALPRAZOLAM 0.25 MG PO TABS: 0.2500 mg | ORAL_TABLET | Freq: Two times a day (BID) | ORAL | 0 refills | Status: DC | PRN

## 2019-10-19 MED ORDER — FLUOXETINE HCL 20 MG PO TABS: 10.0000 mg | ORAL_TABLET | Freq: Every day | ORAL | 1 refills | Status: DC

## 2019-10-19 NOTE — Progress Notes (Signed)
Virtual Visit via Video Note  I connected with Joan Thomas on 10/19/19 at  4:40 PM EST by a video enabled telemedicine application and verified that I am speaking with the correct person using two identifiers.  Location: Patient: home Provider: work   I discussed the limitations of evaluation and management by telemedicine and the availability of in person appointments. The patient expressed understanding and agreed to proceed.  History of Present Illness:  Patient is a 33 year old female who presents today for follow-up.  We last saw her on August 31, 2019.  At that time she reported anxiety symptoms.  We gave her a trial of Prozac.  We also gave her a short course of Xanax to use as needed. She is taking a half pill of the 20mg .  Reports that she felt "funny" on the 20mg . Reports that she has used occasional xanax and this helps. Using less frequently now that she is on prozac.   Elevated blood pressure reading-noted last visit. BP Readings from Last 3 Encounters:  08/31/19 (!) 153/94  08/26/19 131/90  07/13/19 124/87   Past Medical History:  Diagnosis Date  . Anxiety   . GERD (gastroesophageal reflux disease)   . Vaginal Pap smear, abnormal      Social History   Socioeconomic History  . Marital status: Married    Spouse name: Not on file  . Number of children: 0  . Years of education: Not on file  . Highest education level: Not on file  Occupational History  . Not on file  Tobacco Use  . Smoking status: Current Some Day Smoker    Packs/day: 0.50    Types: Cigarettes  . Smokeless tobacco: Never Used  . Tobacco comment: trying to quit  Substance and Sexual Activity  . Alcohol use: Yes    Alcohol/week: 7.0 standard drinks    Types: 2 Glasses of wine, 5 Shots of liquor per week    Comment: weekly  . Drug use: No  . Sexual activity: Yes    Partners: Male    Birth control/protection: None  Other Topics Concern  . Not on file  Social History Narrative   Worked  for [employer] x 10 years, completed an 07/15/19.     Left her recent job   Married to [employer]   No children    Has a dog   Family is out of town.    Enjoys cooking/reading.    Social Determinants of Health   Financial Resource Strain:   . Difficulty of Paying Living Expenses: Not on file  Food Insecurity: No Food Insecurity  . Worried About Metallurgist in the Last Year: Never true  . Ran Out of Food in the Last Year: Never true  Transportation Needs: No Transportation Needs  . Lack of Transportation (Medical): No  . Lack of Transportation (Non-Medical): No  Physical Activity: Unknown  . Days of Exercise per Week: 0 days  . Minutes of Exercise per Session: Not on file  Stress:   . Feeling of Stress : Not on file  Social Connections: Slightly Isolated  . Frequency of Communication with Friends and Family: More than three times a week  . Frequency of Social Gatherings with Friends and Family: Twice a week  . Attends Religious Services: More than 4 times per year  . Active Member of Clubs or Organizations: No  . Attends [employer] Meetings: Never  . Marital Status: Married  Programme researcher, broadcasting/film/video Violence:  Not At Risk  . Fear of Current or Ex-Partner: No  . Emotionally Abused: No  . Physically Abused: No  . Sexually Abused: No    Past Surgical History:  Procedure Laterality Date  . ECTOPIC PREGNANCY SURGERY    . ESOPHAGEAL MANOMETRY N/A 07/28/2017   Procedure: ESOPHAGEAL MANOMETRY (EM);  Surgeon: Mauri Pole, MD;  [facility];  Service: Endoscopy;  Laterality: N/A;  . [place] IMPEDANCE STUDY N/A 07/28/2017   Procedure: [place] IMPEDANCE STUDY;  Surgeon: Mauri Pole, MD;  [facility];  Service: Endoscopy;  Laterality: N/A;    Family History  Problem Relation Age of Onset  . Hypertension Mother   . Arthritis Maternal Grandmother   . GER disease Maternal Grandmother   . Gout Maternal Grandmother   .  Osteoporosis Maternal Grandmother   . Diabetes Maternal Grandfather   . Heart disease Maternal Grandfather   . Sleep apnea Maternal Grandfather   . Kidney failure Paternal Grandmother   . Breast cancer Paternal Grandmother   . COPD Paternal Grandmother   . Prostate cancer Paternal Grandfather        several types  . Breast cancer Maternal Aunt   . Prostate cancer Maternal Uncle   . Hypertension Sister        thrombocytosis  . Alcohol abuse Neg Hx   . Asthma Neg Hx   . Birth defects Neg Hx   . Depression Neg Hx   . Drug abuse Neg Hx   . Early death Neg Hx   . Hearing loss Neg Hx   . Hyperlipidemia Neg Hx   . Kidney disease Neg Hx   . Learning disabilities Neg Hx   . Mental illness Neg Hx   . Mental retardation Neg Hx   . Miscarriages / Stillbirths Neg Hx   . Stroke Neg Hx   . Vision loss Neg Hx     No Known Allergies  Current Outpatient Medications on File Prior to Visit  Medication Sig Dispense Refill  . loratadine (CLARITIN) 10 MG tablet Take 1 tablet (10 mg total) by mouth daily. 30 tablet 11   No current facility-administered medications on file prior to visit.    There were no vitals taken for this visit.      Observations/Objective:   Gen: Awake, alert, no acute distress Resp: Breathing is even and non-labored Psych: calm/pleasant demeanor Neuro: Alert and Oriented x 3, + facial symmetry, speech is clear.   Assessment and Plan:  Anxiety- improved on prozac 10mg . Continue same. Continue prn xanax. She will need to sign a controlled substance contract and give UDS.    Elevated blood pressure reading- will schedule a nurse visit for bp recheck.   Follow Up Instructions:    I discussed the assessment and treatment plan with the patient. The patient was provided an opportunity to ask questions and all were answered. The patient agreed with the plan and demonstrated an understanding of the instructions.   The patient was advised to call back or seek an  in-person evaluation if the symptoms worsen or if the condition fails to improve as anticipated.  Nance Pear, NP

## 2019-11-01 ENCOUNTER — Encounter: Payer: Self-pay | Admitting: Family

## 2019-11-14 ENCOUNTER — Emergency Department (HOSPITAL_BASED_OUTPATIENT_CLINIC_OR_DEPARTMENT_OTHER): Payer: PRIVATE HEALTH INSURANCE

## 2019-11-14 ENCOUNTER — Other Ambulatory Visit: Payer: Self-pay

## 2019-11-14 ENCOUNTER — Encounter (HOSPITAL_BASED_OUTPATIENT_CLINIC_OR_DEPARTMENT_OTHER): Payer: Self-pay | Admitting: Emergency Medicine

## 2019-11-14 ENCOUNTER — Emergency Department (HOSPITAL_BASED_OUTPATIENT_CLINIC_OR_DEPARTMENT_OTHER)
Admission: EM | Admit: 2019-11-14 | Discharge: 2019-11-14 | Disposition: A | Discharge status: Home / Self Care | Payer: PRIVATE HEALTH INSURANCE | Attending: Emergency Medicine | Admitting: Emergency Medicine

## 2019-11-14 DIAGNOSIS — Z8616 Personal history of COVID-19: Secondary | ICD-10-CM | POA: Diagnosis not present

## 2019-11-14 DIAGNOSIS — Z79899 Other long term (current) drug therapy: Secondary | ICD-10-CM | POA: Diagnosis not present

## 2019-11-14 DIAGNOSIS — R0602 Shortness of breath: Secondary | ICD-10-CM | POA: Diagnosis not present

## 2019-11-14 DIAGNOSIS — F17210 Nicotine dependence, cigarettes, uncomplicated: Secondary | ICD-10-CM | POA: Insufficient documentation

## 2019-11-14 DIAGNOSIS — R112 Nausea with vomiting, unspecified: Secondary | ICD-10-CM | POA: Diagnosis not present

## 2019-11-14 NOTE — ED Provider Notes (Signed)
[facility] EMERGENCY DEPARTMENT Provider Note   CSN: [id number] Arrival date & time: 11/14/19  1338     History Chief Complaint  Patient presents with  . Shortness of Breath    COVID+    Joan Thomas is a 34 y.o. female presenting for evaluation of shortness of breath.  Patient states she was diagnosed with Covid beginning of January.  She ended quarantine 1 week ago, was fever free and symptom-free at that time.  She was symptom-free for the past week until this morning.  She states found hour after she woke up she felt more short of breath than normal.  She also reports nausea with 1 episode of vomiting.  Patient states this could be due to her anxiety, but she was concerned there might be something because of the Covid virus.  She states she drank alcohol last night, which often will trigger a panic attack the next day.  She takes fluoxetine daily, uses Xanax as needed.  She has not taken any today or recently.  She denies fevers, chills, cough, chest pain, abdominal pain, urinary symptoms, normal bowel movements.  She has no other medical problems and takes medications daily.  She does smoke cigarettes.  She denies recent travel, surgeries, immobilization, history of cancer, history of previous DVT/PE, or hormone use.  HPI     Past Medical History:  Diagnosis Date  . Anxiety   . GERD (gastroesophageal reflux disease)   . Vaginal Pap smear, abnormal     Patient Active Problem List   Diagnosis Date Noted  . High risk HPV infection 07/20/2019  . Dysphagia   . Gastroesophageal reflux disease     Past Surgical History:  Procedure Laterality Date  . ECTOPIC PREGNANCY SURGERY    . ESOPHAGEAL MANOMETRY N/A 07/28/2017   Procedure: ESOPHAGEAL MANOMETRY (EM);  Surgeon: Mauri Pole, MD;  Location: [facility];  Service: Endoscopy;  Laterality: N/A;  . [place] IMPEDANCE STUDY N/A 07/28/2017   Procedure: [place] IMPEDANCE STUDY;  Surgeon: Mauri Pole, MD;   Location: [facility];  Service: Endoscopy;  Laterality: N/A;     OB History    Gravida  1   Para      Term      Preterm      AB      Living        SAB      TAB      Ectopic      Multiple      Live Births              Family History  Problem Relation Age of Onset  . Hypertension Mother   . Arthritis Maternal Grandmother   . GER disease Maternal Grandmother   . Gout Maternal Grandmother   . Osteoporosis Maternal Grandmother   . Diabetes Maternal Grandfather   . Heart disease Maternal Grandfather   . Sleep apnea Maternal Grandfather   . Kidney failure Paternal Grandmother   . Breast cancer Paternal Grandmother   . COPD Paternal Grandmother   . Prostate cancer Paternal Grandfather        several types  . Breast cancer Maternal Aunt   . Prostate cancer Maternal Uncle   . Hypertension Sister        thrombocytosis  . Alcohol abuse Neg Hx   . Asthma Neg Hx   . Birth defects Neg Hx   . Depression Neg Hx   . Drug abuse Neg Hx   . Early  death Neg Hx   . Hearing loss Neg Hx   . Hyperlipidemia Neg Hx   . Kidney disease Neg Hx   . Learning disabilities Neg Hx   . Mental illness Neg Hx   . Mental retardation Neg Hx   . Miscarriages / Stillbirths Neg Hx   . Stroke Neg Hx   . Vision loss Neg Hx     Social History   Tobacco Use  . Smoking status: Current Some Day Smoker    Packs/day: 0.50    Types: Cigarettes  . Smokeless tobacco: Never Used  . Tobacco comment: trying to quit  Substance Use Topics  . Alcohol use: Yes    Alcohol/week: 7.0 standard drinks    Types: 2 Glasses of wine, 5 Shots of liquor per week    Comment: weekly  . Drug use: No    Home Medications Prior to Admission medications   Medication Sig Start Date End Date Taking? Authorizing Provider  ALPRAZolam (XANAX) 0.25 MG tablet Take 1 tablet (0.25 mg total) by mouth 2 (two) times daily as needed for anxiety. 10/19/19   Sandford Craze, NP  FLUoxetine (PROZAC) 20 MG tablet Take  0.5 tablets (10 mg total) by mouth daily. 10/19/19   Sandford Craze, NP  loratadine (CLARITIN) 10 MG tablet Take 1 tablet (10 mg total) by mouth daily. 07/13/19   Sandford Craze, NP    Allergies    Patient has no known allergies.  Review of Systems   Review of Systems  Respiratory: Positive for shortness of breath.   Gastrointestinal: Positive for nausea and vomiting.  Psychiatric/Behavioral: The patient is nervous/anxious.   All other systems reviewed and are negative.   Physical Exam Updated Vital Signs BP (!) 130/100 (BP Location: Right Arm)   Pulse (!) 120   Temp 99.1 F (37.3 C) (Oral)   Resp 20   Ht 5\' 4"  (1.626 m)   Wt 81.6 kg   LMP 10/28/2019   SpO2 100%   BMI 30.90 kg/m   Physical Exam Vitals and nursing note reviewed.  Constitutional:      General: She is not in acute distress.    Appearance: She is well-developed.     Comments: Sitting in the chair no acute distress  HENT:     Head: Normocephalic and atraumatic.  Eyes:     Extraocular Movements: Extraocular movements intact.     Conjunctiva/sclera: Conjunctivae normal.     Pupils: Pupils are equal, round, and reactive to light.  Cardiovascular:     Rate and Rhythm: Regular rhythm. Tachycardia present.     Pulses: Normal pulses.     Comments: Tachycardic around 110 at rest.  Heart rate between 120 and 130 with ambulation. Pulmonary:     Effort: Pulmonary effort is normal. No respiratory distress.     Breath sounds: Normal breath sounds. No wheezing.     Comments: Speaking in full sentences.  Clear lung sounds in all fields.  No dyspnea with exertion or increased shortness of breath with ambulation. spo2 100% on RA at rest and when walking.  Abdominal:     General: There is no distension.     Palpations: Abdomen is soft. There is no mass.     Tenderness: There is no abdominal tenderness. There is no guarding or rebound.     Comments: No ttp of the abd. Soft without rigidity, guarding distention.  Negative rebound.   Musculoskeletal:        General: Normal range of motion.  Cervical back: Normal range of motion and neck supple.     Right lower leg: No edema.     Left lower leg: No edema.  Skin:    General: Skin is warm and dry.     Capillary Refill: Capillary refill takes less than 2 seconds.  Neurological:     Mental Status: She is alert and oriented to person, place, and time.     ED Results / Procedures / Treatments   Labs (all labs ordered are listed, but only abnormal results are displayed) Labs Reviewed - No data to display  EKG None  Radiology No results found.  Procedures Procedures (including critical care time)  Medications Ordered in ED Medications - No data to display  ED Course  I have reviewed the triage vital signs and the nursing notes.  Pertinent labs & imaging results that were available during my care of the patient were reviewed by me and considered in my medical decision making (see chart for details).    MDM Rules/Calculators/A&P                      Pt presenting for evaluation of SOB. physical exam shows pt who appears nontoxic. She is tachycardic, consider this due to anxiety vs dehydration from etoh last night. consider PE, but low suspicion due to no pleuritic sxs, cp, hypoxia, or increased sob with exertion. No risk factors besides recent covid infection. Will not order d-dimer as this may be falsely elevated due to recent covid infection. Will obtain cxr to r/o pna or other signs of lung injury. Pt encouraged PO intake. She will take home xanax and will reassess. Case discussed with attending, Dr. Juleen China agrees to plan.   X-ray viewed interpreted by me, no pneumonia, or some effusion, cardiomegaly.  On recheck, heart rate has resolved and is back to normal.  Patient states she is feeling much better, no further nausea or shortness of breath.  Discussed symptoms are likely multifactorial including anxiety and dehydration.  Encourage  follow-up with primary care as needed for further evaluation.  At this time, patient appears safe for discharge.  Return precautions given.  Patient states she understands and agrees to plan.  Joan Thomas was evaluated in Emergency Department on 11/14/2019 for the symptoms described in the history of present illness. She was evaluated in the context of the global COVID-19 pandemic, which necessitated consideration that the patient might be at risk for infection with the SARS-CoV-2 virus that causes COVID-19. Institutional protocols and algorithms that pertain to the evaluation of patients at risk for COVID-19 are in a state of rapid change based on information released by regulatory bodies including the CDC and federal and state organizations. These policies and algorithms were followed during the patient's care in the ED.  Final Clinical Impression(s) / ED Diagnoses Final diagnoses:  None    Rx / DC Orders ED Discharge Orders    None       Alveria Apley, PA-C 11/14/19 1601    Raeford Razor, MD 11/15/19 1605

## 2019-11-14 NOTE — ED Notes (Signed)
Per EDP she advised that pt could taker her home dose of Xanax at this time.

## 2019-11-14 NOTE — ED Triage Notes (Signed)
Diagnosed with COVID on 1/6. She was improving and states she had no symptoms yesterday. She started having SOB again today but states it could be anxiety. Denies pain.

## 2019-11-14 NOTE — Discharge Instructions (Addendum)
Continue taking home medications as prescribed. Follow-up with your primary care doctor as needed for further evaluation of your symptoms. Make sure you are staying well-hydrated water. Return to the emergency room with any new, worsening, or concerning symptoms.

## 2019-11-25 ENCOUNTER — Telehealth: Payer: Self-pay | Admitting: Family

## 2019-11-25 NOTE — Telephone Encounter (Signed)
Caller Name: self Phone: [phone]  Pt called about signing controlled substance contract. I did not see it in the file in the front. Pt wanting to sign contract and have UDS at the same time. Please notify her when ready and she will schedule lab appt.

## 2019-12-03 ENCOUNTER — Telehealth: Payer: Self-pay

## 2019-12-03 ENCOUNTER — Ambulatory Visit (INDEPENDENT_AMBULATORY_CARE_PROVIDER_SITE_OTHER): Payer: PRIVATE HEALTH INSURANCE

## 2019-12-03 ENCOUNTER — Other Ambulatory Visit: Payer: Self-pay

## 2019-12-03 ENCOUNTER — Other Ambulatory Visit (INDEPENDENT_AMBULATORY_CARE_PROVIDER_SITE_OTHER): Payer: PRIVATE HEALTH INSURANCE

## 2019-12-03 VITALS — BP 128/88 | HR 86

## 2019-12-03 DIAGNOSIS — F419 Anxiety disorder, unspecified: Secondary | ICD-10-CM | POA: Diagnosis not present

## 2019-12-03 DIAGNOSIS — R030 Elevated blood-pressure reading, without diagnosis of hypertension: Secondary | ICD-10-CM

## 2019-12-03 NOTE — Progress Notes (Signed)
Pt here for Blood pressure check per PCP order on 11/25/2019.  BP on 08/31/2019 was 153/94.   Pt currently takes: No antihypertensives.    BP today @ = 128/88 HR = 86  Pt advised she will receive call with any change in treatment.   Alysia Penna, RN

## 2019-12-03 NOTE — Telephone Encounter (Signed)
Advised patient of PCP recommendations.  Low sodium diet reviewed with patient, verbalized understanding.

## 2019-12-03 NOTE — Progress Notes (Signed)
BP reviewed. Please advise pt to follow a low sodium diet and we will continue to keep an eye on her blood pressure.

## 2019-12-05 LAB — PAIN MGMT, PROFILE 8 W/CONF, U
6 Acetylmorphine: NEGATIVE ng/mL
Alcohol Metabolites: POSITIVE ng/mL — AB (ref <500)
Amphetamines: NEGATIVE ng/mL
Benzodiazepines: NEGATIVE ng/mL
Buprenorphine, Urine: NEGATIVE ng/mL
Cocaine Metabolite: NEGATIVE ng/mL
Creatinine: 205.4 mg/dL
Ethyl Glucuronide (ETG): >100000 ng/mL
Ethyl Sulfate (ETS): 51267 ng/mL
MDMA: NEGATIVE ng/mL
Marijuana Metabolite: NEGATIVE ng/mL
Opiates: NEGATIVE ng/mL
Oxidant: NEGATIVE ug/mL
Oxycodone: NEGATIVE ng/mL
pH: 5.7 (ref 4.5–9.0)

## 2019-12-26 ENCOUNTER — Encounter: Payer: Self-pay | Admitting: Family

## 2019-12-27 MED ORDER — FLUOXETINE HCL 20 MG PO TABS: 10.0000 mg | ORAL_TABLET | Freq: Every day | ORAL | 1 refills | Status: DC

## 2019-12-27 MED ORDER — ALPRAZOLAM 0.25 MG PO TABS: 0.2500 mg | ORAL_TABLET | Freq: Two times a day (BID) | ORAL | 0 refills | Status: DC | PRN

## 2020-02-17 ENCOUNTER — Other Ambulatory Visit: Payer: Self-pay | Admitting: Family

## 2020-02-17 ENCOUNTER — Encounter: Payer: Self-pay | Admitting: Family

## 2020-02-17 MED ORDER — ALPRAZOLAM 0.25 MG PO TABS: 0.2500 mg | ORAL_TABLET | Freq: Two times a day (BID) | ORAL | 0 refills | Status: DC | PRN

## 2020-02-17 NOTE — Telephone Encounter (Signed)
Requesting: xanax Contract:12/20/19 UDS:12/03/19 Last Visit:10/19/19 Next Visit:N/A Last Refill:3.1.21  Please Advise

## 2020-09-25 ENCOUNTER — Other Ambulatory Visit: Payer: Self-pay | Admitting: Family

## 2020-09-26 ENCOUNTER — Telehealth: Payer: Self-pay | Admitting: Family

## 2020-09-26 ENCOUNTER — Encounter: Payer: Self-pay | Admitting: Family

## 2020-09-26 NOTE — Telephone Encounter (Signed)
Last written: 02/17/20 Last ov: 10/19/19 Next ov: none Contract: 12/03/19 UDS:12/03/19

## 2020-09-26 NOTE — Telephone Encounter (Signed)
Xanax refill not sent as pt is overdue for follow up. Please schedule an in office visit with me 12/10 or later.

## 2020-09-26 NOTE — Telephone Encounter (Signed)
Patient informed provider is unable to provide rx at this time and scheduled for 10-09-2020

## 2020-10-09 ENCOUNTER — Encounter: Payer: Self-pay | Admitting: Family

## 2020-10-09 ENCOUNTER — Ambulatory Visit: Payer: PRIVATE HEALTH INSURANCE | Admitting: Family

## 2020-10-17 ENCOUNTER — Ambulatory Visit: Payer: PRIVATE HEALTH INSURANCE | Admitting: Family

## 2020-11-16 DIAGNOSIS — R0981 Nasal congestion: Secondary | ICD-10-CM | POA: Diagnosis not present

## 2020-11-16 DIAGNOSIS — Z20822 Contact with and (suspected) exposure to covid-19: Secondary | ICD-10-CM | POA: Diagnosis not present

## 2020-11-16 DIAGNOSIS — J029 Acute pharyngitis, unspecified: Secondary | ICD-10-CM | POA: Diagnosis not present

## 2020-11-16 DIAGNOSIS — R509 Fever, unspecified: Secondary | ICD-10-CM | POA: Diagnosis not present

## 2020-11-22 ENCOUNTER — Other Ambulatory Visit: Payer: Self-pay | Admitting: Family

## 2020-11-22 ENCOUNTER — Other Ambulatory Visit: Payer: Self-pay

## 2020-11-22 ENCOUNTER — Telehealth (INDEPENDENT_AMBULATORY_CARE_PROVIDER_SITE_OTHER): Payer: BC Managed Care – PPO | Admitting: Family

## 2020-11-22 DIAGNOSIS — F419 Anxiety disorder, unspecified: Secondary | ICD-10-CM | POA: Diagnosis not present

## 2020-11-22 MED ORDER — ALPRAZOLAM 0.25 MG PO TABS: 0.2500 mg | ORAL_TABLET | Freq: Two times a day (BID) | ORAL | 0 refills | Status: DC | PRN

## 2020-11-22 MED ORDER — LORATADINE 10 MG PO TABS: 10.0000 mg | ORAL_TABLET | Freq: Every day | ORAL | 4 refills | Status: DC

## 2020-11-22 NOTE — Progress Notes (Signed)
Virtual Visit via Video Note  I connected with Joan Thomas on 11/22/20 at  7:20 AM EST by a video enabled telemedicine application and verified that I am speaking with the correct person using two identifiers.  Location: Patient: home Provider: work   I discussed the limitations of evaluation and management by telemedicine and the availability of in person appointments. The patient expressed understanding and agreed to proceed. Only the patient and myself were present for today's video call.   History of Present Illness:  Patient is a 35 yr old female who presents today for follow up of her anxiety. She states that she stopped taking prozac as it made her anxiety feel worse.  Overall, her anxiety has been improved.  She uses xanax on a prn basis. The times that her anxiety is the worst is if she has to drive on a highway.    She recently came off of COVID-19 quarantine.  She still has some clear nasal drainage and is requesting a refill on claritin. She has had 2 covid vaccines and is not yet due for booster.     Observations/Objective:   Gen: Awake, alert, no acute distress Resp: Breathing is even and non-labored Psych: calm/pleasant demeanor Neuro: Alert and Oriented x 3, + facial symmetry, speech is clear.   Assessment and Plan:  Anxiety- overall stable without prozac. She is requesting a refill of xanax for prn use. She will return to the lab for UDS and to sign her controlled substance contract.    Follow Up Instructions:    I discussed the assessment and treatment plan with the patient. The patient was provided an opportunity to ask questions and all were answered. The patient agreed with the plan and demonstrated an understanding of the instructions.   The patient was advised to call back or seek an in-person evaluation if the symptoms worsen or if the condition fails to improve as anticipated.  Lemont Fillers, NP

## 2020-11-27 ENCOUNTER — Other Ambulatory Visit: Payer: BC Managed Care – PPO

## 2020-12-07 ENCOUNTER — Other Ambulatory Visit: Payer: BC Managed Care – PPO

## 2020-12-07 ENCOUNTER — Other Ambulatory Visit: Payer: Self-pay

## 2020-12-07 DIAGNOSIS — F419 Anxiety disorder, unspecified: Secondary | ICD-10-CM

## 2020-12-09 LAB — DRUG MONITORING, PANEL 8 WITH CONFIRMATION, URINE
6 Acetylmorphine: NEGATIVE ng/mL (ref <10)
Alcohol Metabolites: POSITIVE ng/mL — AB
Amphetamines: NEGATIVE ng/mL (ref <500)
Benzodiazepines: NEGATIVE ng/mL (ref <100)
Buprenorphine, Urine: NEGATIVE ng/mL (ref <5)
Cocaine Metabolite: NEGATIVE ng/mL (ref <150)
Creatinine: 255.2 mg/dL
Ethyl Glucuronide (ETG): >100000 ng/mL — ABNORMAL HIGH (ref <500)
Ethyl Sulfate (ETS): >100000 ng/mL — ABNORMAL HIGH (ref <100)
MDMA: NEGATIVE ng/mL (ref <500)
Marijuana Metabolite: NEGATIVE ng/mL (ref <20)
Opiates: NEGATIVE ng/mL (ref <100)
Oxidant: NEGATIVE ug/mL
Oxycodone: NEGATIVE ng/mL (ref <100)
pH: 5.5 (ref 4.5–9.0)

## 2020-12-09 LAB — DM TEMPLATE

## 2020-12-27 ENCOUNTER — Encounter: Payer: BC Managed Care – PPO | Admitting: Family

## 2021-01-17 ENCOUNTER — Encounter (HOSPITAL_BASED_OUTPATIENT_CLINIC_OR_DEPARTMENT_OTHER): Payer: Self-pay

## 2021-01-17 ENCOUNTER — Emergency Department (HOSPITAL_BASED_OUTPATIENT_CLINIC_OR_DEPARTMENT_OTHER)
Admission: EM | Admit: 2021-01-17 | Discharge: 2021-01-17 | Disposition: A | Discharge status: Home / Self Care | Payer: BC Managed Care – PPO | Attending: Emergency Medicine | Admitting: Emergency Medicine

## 2021-01-17 ENCOUNTER — Other Ambulatory Visit: Payer: Self-pay

## 2021-01-17 DIAGNOSIS — M7989 Other specified soft tissue disorders: Secondary | ICD-10-CM | POA: Insufficient documentation

## 2021-01-17 DIAGNOSIS — R600 Localized edema: Secondary | ICD-10-CM | POA: Diagnosis not present

## 2021-01-17 DIAGNOSIS — F17210 Nicotine dependence, cigarettes, uncomplicated: Secondary | ICD-10-CM | POA: Insufficient documentation

## 2021-01-17 NOTE — Discharge Instructions (Addendum)
Decrease your salt and alcohol intake. Elevate your legs as much as possible, wear compression stockings throughout the day.  Follow with your PCP if symptoms persist.

## 2021-01-17 NOTE — ED Provider Notes (Signed)
[facility] EMERGENCY DEPARTMENT Provider Note   CSN: [id number] Arrival date & time: 01/17/21  1815     History Chief Complaint  Patient presents with  . Leg Swelling    Joan Thomas is a 35 y.o. female without significant PMHx, presenting to the ED for evaluation of lower leg and foot swelling that began 3 days ago.  She states for symptoms began she had a night out dancing with alcohol.  She has been having waxing and waning swelling to bilateral lower aspects of her legs.  She symptom in Epsom salt and elevated which is provided improvement.  She has been drinking alcohol nightly of the last few days and also admits to increased salt intake.  This is happened in the past and improved with similar treatment.  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 Denies pain or redness.  She attempted to go to urgent care, however they were closed.  No hx DVT, recent surgery.   The history is provided by the patient.       Past Medical History:  Diagnosis Date  . Anxiety   . GERD (gastroesophageal reflux disease)   . Vaginal Pap smear, abnormal     Patient Active Problem List   Diagnosis Date Noted  . High risk HPV infection 07/20/2019  . Dysphagia   . Gastroesophageal reflux disease     Past Surgical History:  Procedure Laterality Date  . ECTOPIC PREGNANCY SURGERY    . ESOPHAGEAL MANOMETRY N/A 07/28/2017   Procedure: ESOPHAGEAL MANOMETRY (EM);  Surgeon: Napoleon Form, MD;  Location: [facility];  Service: Endoscopy;  Laterality: N/A;  . [place] IMPEDANCE STUDY N/A 07/28/2017   Procedure: [place] IMPEDANCE STUDY;  Surgeon: Napoleon Form, MD;  Location: [facility];  Service: Endoscopy;  Laterality: N/A;     OB History    Gravida  1   Para      Term      Preterm      AB      Living        SAB      IAB      Ectopic      Multiple      Live Births              Family History  Problem Relation Age of Onset  . Hypertension Mother    . Arthritis Maternal Grandmother   . GER disease Maternal Grandmother   . Gout Maternal Grandmother   . Osteoporosis Maternal Grandmother   . Diabetes Maternal Grandfather   . Heart disease Maternal Grandfather   . Sleep apnea Maternal Grandfather   . Kidney failure Paternal Grandmother   . Breast cancer Paternal Grandmother   . COPD Paternal Grandmother   . Prostate cancer Paternal Grandfather        several types  . Breast cancer Maternal Aunt   . Prostate cancer Maternal Uncle   . Hypertension Sister        thrombocytosis  . Alcohol abuse Neg Hx   . Asthma Neg Hx   . Birth defects Neg Hx   . Depression Neg Hx   . Drug abuse Neg Hx   . Early death Neg Hx   . Hearing loss Neg Hx   . Hyperlipidemia Neg Hx   . Kidney disease Neg Hx   . Learning disabilities Neg Hx   . Mental illness Neg Hx   . Mental retardation Neg Hx   . Miscarriages /  Stillbirths Neg Hx   . Stroke Neg Hx   . Vision loss Neg Hx     Social History   Tobacco Use  . Smoking status: Current Some Day Smoker    Packs/day: 0.50    Types: Cigarettes  . Smokeless tobacco: Never Used  . Tobacco comment: trying to quit  Vaping Use  . Vaping Use: Some days  . Substances: CBD, Flavoring, Synthetic cannabinoids  . Devices: hooka  Substance Use Topics  . Alcohol use: Yes    Alcohol/week: 7.0 standard drinks    Types: 2 Glasses of wine, 5 Shots of liquor per week    Comment: weekly  . Drug use: No    Home Medications Prior to Admission medications   Medication Sig Start Date End Date Taking? Authorizing Provider  ALPRAZolam (XANAX) 0.25 MG tablet Take 1 tablet (0.25 mg total) by mouth 2 (two) times daily as needed for anxiety. 11/22/20   Sandford Craze, NP  loratadine (CLARITIN) 10 MG tablet Take 1 tablet (10 mg total) by mouth daily. 11/22/20   Sandford Craze, NP    Allergies    Patient has no known allergies.  Review of Systems   Review of Systems  All other systems reviewed and are  negative.   Physical Exam Updated Vital Signs BP (!) 144/95 (BP Location: Left Arm)   Pulse 88   Temp 98.8 F (37.1 C) (Oral)   Resp 20   Ht 5\' 4"  (1.626 m)   Wt 74.8 kg   SpO2 100%   BMI 28.32 kg/m   Physical Exam Vitals and nursing note reviewed.  Constitutional:      General: She is not in acute distress.    Appearance: She is well-developed.  HENT:     Head: Normocephalic and atraumatic.  Eyes:     Conjunctiva/sclera: Conjunctivae normal.  Pulmonary:     Effort: Pulmonary effort is normal.  Skin:    Comments: Minimal amount of equal edema to bilateral distal lower legs and feet.  Normal sensation, strong DP pulses.  No color change.  No calf tenderness  Neurological:     Mental Status: She is alert.  Psychiatric:        Mood and Affect: Mood normal.        Behavior: Behavior normal.     ED Results / Procedures / Treatments   Labs (all labs ordered are listed, but only abnormal results are displayed) Labs Reviewed - No data to display  EKG None  Radiology No results found.  Procedures Procedures   Medications Ordered in ED Medications - No data to display  ED Course  I have reviewed the triage vital signs and the nursing notes.  Pertinent labs & imaging results that were available during my care of the patient were reviewed by me and considered in my medical decision making (see chart for details).    MDM Rules/Calculators/A&P                          Patient presenting with a few days of waxing and waning pedal edema, that improves with Epsom salt soaks and elevation.  Has happened in the past.  Admits to increased alcohol and salt intake over the week, and has a sedentary job.  Examination is not concerning for DVT given equal bilateral nature present to the distal portion of the lower legs.  She is confident she is not pregnant, menstrual period began today.  Recommend decrease  salt intake and alcohol intake, increased elevation, compression  stockings, PCP follow-up if symptoms persist.  Blood pressure log.  Patient is agreeable to plan appropriate for discharge.  Discussed results, findings, treatment and follow up. Patient advised of return precautions. Patient verbalized understanding and agreed with plan.  Final Clinical Impression(s) / ED Diagnoses Final diagnoses:  Leg swelling    Rx / DC Orders ED Discharge Orders    None       Robinson, [place] N, PA-C 01/17/21 1855    Cheryll Cockayne, MD 01/17/21 [phone]

## 2021-01-17 NOTE — ED Triage Notes (Signed)
Lower leg swelling since yesterday, improving since this morning.

## 2021-01-18 ENCOUNTER — Telehealth: Payer: Self-pay

## 2021-01-18 NOTE — Telephone Encounter (Signed)
Nurse Assessment Nurse: Darcus Austin RN, Sarah Date/Time Lamount Cohen Time): 01/17/2021 5:08:32 PM Confirm and document reason for call. If symptomatic, describe symptoms. ---Caller states she is having swelling in feet, ankle and lower legs. Started 3 days ago. States her legs had felt sore but that is gone now. Ankles look dark. Does the patient have any new or worsening symptoms? ---Yes Will a triage be completed? ---Yes Related visit to physician within the last 2 weeks? ---No Does the PT have any chronic conditions? (i.e. diabetes, asthma, this includes High risk factors for pregnancy, etc.) ---No Is the patient pregnant or possibly pregnant? (Ask all females between the ages of 13-55) ---No Is this a behavioral health or substance abuse call? ---No Guidelines Guideline Title Affirmed Question Affirmed Notes Nurse Date/Time Lamount Cohen Time) Leg Swelling and Edema [1] Thigh, calf, or ankle swelling AND [2] bilateral AND [3] 1 side is more swollen Goins, RN, Maralyn Sago 01/17/2021 5:09:59 PM Disp. Time Lamount Cohen Time) Disposition Final User 01/17/2021 5:12:40 PM See HCP within 4 Hours (or PCP triage) Yes Goins, RN, Sarah PLEASE NOTE: All timestamps contained within this report are represented as [place] Standard Time. CONFIDENTIALTY NOTICE: This fax transmission is intended only for the addressee. It contains information that is legally privileged, confidential or otherwise protected from use or disclosure. If you are not the intended recipient, you are strictly prohibited from reviewing, disclosing, copying using or disseminating any of this information or taking any action in reliance on or regarding this information. If you have received this fax in error, please notify us immediately by telephone so that we can arrange for its return to [place]. Phone: [phone], Toll-Free: [phone], Fax: [phone] Page: 2 of 2 Call Id: [id number] Caller Disagree/Comply Comply Caller Understands  Yes PreDisposition Call Doctor Care Advice Given Per Guideline SEE HCP (OR PCP TRIAGE) WITHIN 4 HOURS: * IF OFFICE WILL BE OPEN: You need to be seen within the next 3 or 4 hours. Call your doctor (or NP/PA) now or as soon as the office opens. CARE ADVICE given per Leg Swelling and Edema (Adult) guideline. Referrals GO TO FACILITY UNDECIDED  Pt seen in ED last night

## 2021-05-28 ENCOUNTER — Telehealth: Payer: Self-pay

## 2021-05-28 NOTE — Telephone Encounter (Signed)
Pt has has not been seen 11/22/20 VV. She is now scheduled for 05/30/21. Pt is requesting a letter stating that she is a pt of [facility] and pt suffers for panic attacks and takes medication for it. She needed this since she got a speeding ticket in June.   Pt will tell provider about it in the next OV.

## 2021-05-30 ENCOUNTER — Telehealth: Payer: Self-pay | Admitting: *Deleted

## 2021-05-30 ENCOUNTER — Ambulatory Visit: Payer: BC Managed Care – PPO | Admitting: Family

## 2021-05-30 NOTE — Progress Notes (Incomplete)
Subjective:   By signing my name below, I, Joan Thomas, attest that this documentation has been prepared under the direction and in the presence of Joan Thomas. 05/30/2021    Patient ID: Joan Thomas, female    DOB: 1986/06/18, 35 y.o.   MRN: [id number]  No chief complaint on file.   HPI Patient is in today for a office visit. She complains of anxiety.   Health Maintenance Due  Topic Date Due   COVID-19 Vaccine (1) Never done   Pneumococcal Vaccine 36-62 Years old (1 - PCV) Never done   HIV Screening  Never done   Hepatitis C Screening  Never done   TETANUS/TDAP  Never done   INFLUENZA VACCINE  05/28/2021    Past Medical History:  Diagnosis Date   Anxiety    GERD (gastroesophageal reflux disease)    Vaginal Pap smear, abnormal     Past Surgical History:  Procedure Laterality Date   ECTOPIC PREGNANCY SURGERY     ESOPHAGEAL MANOMETRY N/A 07/28/2017   Procedure: ESOPHAGEAL MANOMETRY (EM);  Surgeon: Joan Form, MD;  Location: [facility];  Service: Endoscopy;  Laterality: N/A;   PH IMPEDANCE STUDY N/A 07/28/2017   Procedure: PH IMPEDANCE STUDY;  Surgeon: Joan Form, MD;  Location: [facility];  Service: Endoscopy;  Laterality: N/A;    Family History  Problem Relation Age of Onset   Hypertension Mother    Arthritis Maternal Grandmother    GER disease Maternal Grandmother    Gout Maternal Grandmother    Osteoporosis Maternal Grandmother    Diabetes Maternal Grandfather    Heart disease Maternal Grandfather    Sleep apnea Maternal Grandfather    Kidney failure Paternal Grandmother    Breast cancer Paternal Grandmother    COPD Paternal Grandmother    Prostate cancer Paternal Grandfather        several types   Breast cancer Maternal Aunt    Prostate cancer Maternal Uncle    Hypertension Sister        thrombocytosis   Alcohol abuse Neg Hx    Asthma Neg Hx    Birth defects Neg Hx    Depression Neg Hx    Drug abuse Neg Hx     Early death Neg Hx    Hearing loss Neg Hx    Hyperlipidemia Neg Hx    Kidney disease Neg Hx    Learning disabilities Neg Hx    Mental illness Neg Hx    Mental retardation Neg Hx    Miscarriages / Stillbirths Neg Hx    Stroke Neg Hx    Vision loss Neg Hx     Social History   Socioeconomic History   Marital status: Married    Spouse name: Not on file   Number of children: 0   Years of education: Not on file   Highest education level: Not on file  Occupational History   Not on file  Tobacco Use   Smoking status: Some Days    Packs/day: 0.50    Types: Cigarettes   Smokeless tobacco: Never   Tobacco comments:    trying to quit  Vaping Use   Vaping Use: Some days   Substances: CBD, Flavoring, Synthetic cannabinoids   Devices: hooka  Substance and Sexual Activity   Alcohol use: Yes    Alcohol/week: 7.0 standard drinks    Types: 2 Glasses of wine, 5 Shots of liquor per week    Comment: weekly   Drug use: No  Sexual activity: Yes    Partners: Male    Birth control/protection: None  Other Topics Concern   Not on file  Social History Narrative   Worked for [employer] Joan Thomas 10 years, completed an Metallurgist.     Left her recent job   Married to [employer] Thomas   No children    Has a dog   Family is out of town.    Enjoys cooking/reading.    Social Determinants of Health   Financial Resource Strain: Not on file  Food Insecurity: Not on file  Transportation Needs: Not on file  Physical Activity: Not on file  Stress: Not on file  Social Connections: Not on file  Intimate Partner Violence: Not on file    Outpatient Medications Prior to Visit  Medication Sig Dispense Refill   loratadine (CLARITIN) 10 MG tablet Take 1 tablet (10 mg total) by mouth daily. 90 tablet 4   No facility-administered medications prior to visit.    No Known Allergies  ROS     Objective:    Physical Exam Constitutional:      General: She is not in acute distress.     Appearance: Normal appearance. She is not ill-appearing.  HENT:     Head: Normocephalic and atraumatic.     Right Ear: External ear normal.     Left Ear: External ear normal.  Eyes:     Extraocular Movements: Extraocular movements intact.     Pupils: Pupils are equal, round, and reactive to light.  Cardiovascular:     Rate and Rhythm: Normal rate and regular rhythm.     Heart sounds: Normal heart sounds. No murmur heard.   No gallop.  Pulmonary:     Effort: Pulmonary effort is normal. No respiratory distress.     Breath sounds: Normal breath sounds. No wheezing or rales.  Skin:    General: Skin is warm and dry.  Neurological:     Mental Status: She is alert and oriented to person, place, and time.  Psychiatric:        Behavior: Behavior normal.    There were no vitals taken for this visit. Wt Readings from Last 3 Encounters:  01/17/21 165 lb (74.8 kg)  11/14/19 180 lb (81.6 kg)  08/31/19 179 lb (81.2 kg)       Assessment & Plan:   Problem List Items Addressed This Visit   None    No orders of the defined types were placed in this encounter.   I, Joan Thomas, personally preformed the services described in this documentation.  All medical record entries made by the scribe were at my direction and in my presence.  I have reviewed the chart and discharge instructions (if applicable) and agree that the record reflects my personal performance and is accurate and complete. 05/30/2021   I,Joan Thomas,acting as a Neurosurgeon for Joan Fillers, Thomas.,have documented all relevant documentation on the behalf of Joan Fillers, Thomas,as directed by  Joan Fillers, Thomas while in the presence of Joan Fillers, Thomas.   Joan Thomas

## 2021-05-30 NOTE — Telephone Encounter (Signed)
Patient rescheduled for 06/11/21

## 2021-05-30 NOTE — Telephone Encounter (Signed)
Who Is Calling Patient / Member / Family / Caregiver Caller Name Cristian Grieves Caller Phone Number [phone] Patient Name Joan Thomas Patient DOB [birth year] Call Type Message Only Information Provided Reason for Call Request to Reschedule Office Appointment Initial Comment Caller states she needs to reschedule her appt. Patient request to speak to RN No Additional Comment Office hours provided. Disp. Time Disposition Final User 05/30/2021 7:17:05 AM General Information Provided Yes Alphonsa Overall

## 2021-05-31 NOTE — Telephone Encounter (Signed)
Entered in error

## 2021-06-05 ENCOUNTER — Emergency Department (HOSPITAL_BASED_OUTPATIENT_CLINIC_OR_DEPARTMENT_OTHER): Payer: BC Managed Care – PPO

## 2021-06-05 ENCOUNTER — Encounter: Payer: Self-pay | Admitting: Physician Assistant

## 2021-06-05 ENCOUNTER — Encounter (HOSPITAL_BASED_OUTPATIENT_CLINIC_OR_DEPARTMENT_OTHER): Payer: Self-pay | Admitting: Urology

## 2021-06-05 ENCOUNTER — Other Ambulatory Visit: Payer: Self-pay

## 2021-06-05 ENCOUNTER — Emergency Department (HOSPITAL_BASED_OUTPATIENT_CLINIC_OR_DEPARTMENT_OTHER)
Admission: EM | Admit: 2021-06-05 | Discharge: 2021-06-05 | Disposition: A | Discharge status: Home / Self Care | Payer: BC Managed Care – PPO | Attending: Emergency Medicine | Admitting: Emergency Medicine

## 2021-06-05 ENCOUNTER — Telehealth: Payer: BC Managed Care – PPO | Admitting: Physician Assistant

## 2021-06-05 DIAGNOSIS — K2920 Alcoholic gastritis without bleeding: Secondary | ICD-10-CM | POA: Insufficient documentation

## 2021-06-05 DIAGNOSIS — R109 Unspecified abdominal pain: Secondary | ICD-10-CM

## 2021-06-05 DIAGNOSIS — K2900 Acute gastritis without bleeding: Secondary | ICD-10-CM | POA: Diagnosis not present

## 2021-06-05 DIAGNOSIS — R112 Nausea with vomiting, unspecified: Secondary | ICD-10-CM | POA: Diagnosis not present

## 2021-06-05 DIAGNOSIS — R1013 Epigastric pain: Secondary | ICD-10-CM

## 2021-06-05 DIAGNOSIS — K760 Fatty (change of) liver, not elsewhere classified: Secondary | ICD-10-CM | POA: Diagnosis not present

## 2021-06-05 DIAGNOSIS — K852 Alcohol induced acute pancreatitis without necrosis or infection: Secondary | ICD-10-CM | POA: Insufficient documentation

## 2021-06-05 DIAGNOSIS — F17210 Nicotine dependence, cigarettes, uncomplicated: Secondary | ICD-10-CM | POA: Diagnosis not present

## 2021-06-05 LAB — CBC WITH DIFFERENTIAL/PLATELET
Abs Immature Granulocytes: 0.02 10*3/uL (ref 0.00–0.07)
Basophils Absolute: 0 10*3/uL (ref 0.0–0.1)
Basophils Relative: 0 %
Eosinophils Absolute: 0 10*3/uL (ref 0.0–0.5)
Eosinophils Relative: 0 %
HCT: 42.1 % (ref 36.0–46.0)
Hemoglobin: 14.4 g/dL (ref 12.0–15.0)
Immature Granulocytes: 0 %
Lymphocytes Relative: 12 %
Lymphs Abs: 1 10*3/uL (ref 0.7–4.0)
MCH: 31.9 pg (ref 26.0–34.0)
MCHC: 34.2 g/dL (ref 30.0–36.0)
MCV: 93.3 fL (ref 80.0–100.0)
Monocytes Absolute: 0.5 10*3/uL (ref 0.1–1.0)
Monocytes Relative: 6 %
Neutro Abs: 6.5 10*3/uL (ref 1.7–7.7)
Neutrophils Relative %: 82 %
Platelets: 249 10*3/uL (ref 150–400)
RBC: 4.51 MIL/uL (ref 3.87–5.11)
RDW: 13.5 % (ref 11.5–15.5)
WBC: 8 10*3/uL (ref 4.0–10.5)
nRBC: 0 % (ref 0.0–0.2)

## 2021-06-05 LAB — URINALYSIS, ROUTINE W REFLEX MICROSCOPIC
Bilirubin Urine: NEGATIVE
Glucose, UA: NEGATIVE mg/dL
Hgb urine dipstick: NEGATIVE
Ketones, ur: NEGATIVE mg/dL
Leukocytes,Ua: NEGATIVE
Nitrite: NEGATIVE
Protein, ur: NEGATIVE mg/dL
Specific Gravity, Urine: >1.03 — ABNORMAL HIGH (ref 1.005–1.030)
pH: 5.5 (ref 5.0–8.0)

## 2021-06-05 LAB — PREGNANCY, URINE: Preg Test, Ur: NEGATIVE

## 2021-06-05 LAB — COMPREHENSIVE METABOLIC PANEL
ALT: 87 U/L — ABNORMAL HIGH (ref 0–44)
AST: 217 U/L — ABNORMAL HIGH (ref 15–41)
Albumin: 4.2 g/dL (ref 3.5–5.0)
Alkaline Phosphatase: 100 U/L (ref 38–126)
Anion gap: 12 (ref 5–15)
BUN: 11 mg/dL (ref 6–20)
CO2: 24 mmol/L (ref 22–32)
Calcium: 9.1 mg/dL (ref 8.9–10.3)
Chloride: 100 mmol/L (ref 98–111)
Creatinine, Ser: 0.68 mg/dL (ref 0.44–1.00)
GFR, Estimated: >60 mL/min (ref >60)
Glucose, Bld: 110 mg/dL — ABNORMAL HIGH (ref 70–99)
Potassium: 4 mmol/L (ref 3.5–5.1)
Sodium: 136 mmol/L (ref 135–145)
Total Bilirubin: 1 mg/dL (ref 0.3–1.2)
Total Protein: 7.8 g/dL (ref 6.5–8.1)

## 2021-06-05 LAB — LIPASE, BLOOD: Lipase: 137 U/L — ABNORMAL HIGH (ref 11–51)

## 2021-06-05 MED ORDER — FAMOTIDINE IN NACL 20-0.9 MG/50ML-% IV SOLN
20.0000 mg | Freq: Once | INTRAVENOUS | Status: AC
  Administered 2021-06-05: 20 mg via INTRAVENOUS
  Filled 2021-06-05: qty 50

## 2021-06-05 MED ORDER — SODIUM CHLORIDE 0.9 % IV BOLUS
1000.0000 mL | Freq: Once | INTRAVENOUS | Status: AC
  Administered 2021-06-05: 1000 mL via INTRAVENOUS

## 2021-06-05 MED ORDER — ONDANSETRON 4 MG PO TBDP: 4.0000 mg | ORAL_TABLET | Freq: Three times a day (TID) | ORAL | 0 refills | Status: DC | PRN

## 2021-06-05 MED ORDER — MORPHINE SULFATE (PF) 4 MG/ML IV SOLN
4.0000 mg | Freq: Once | INTRAVENOUS | Status: DC
  Filled 2021-06-05: qty 1

## 2021-06-05 MED ORDER — IOHEXOL 300 MG/ML  SOLN
100.0000 mL | Freq: Once | INTRAMUSCULAR | Status: AC | PRN
  Administered 2021-06-05: 100 mL via INTRAVENOUS

## 2021-06-05 MED ORDER — HYDROCODONE-ACETAMINOPHEN 5-325 MG PO TABS: 1.0000 | ORAL_TABLET | ORAL | 0 refills | Status: DC | PRN

## 2021-06-05 MED ORDER — ONDANSETRON HCL 4 MG/2ML IJ SOLN
4.0000 mg | Freq: Once | INTRAMUSCULAR | Status: AC
  Administered 2021-06-05: 4 mg via INTRAVENOUS
  Filled 2021-06-05: qty 2

## 2021-06-05 MED ORDER — OMEPRAZOLE 20 MG PO CPDR: 20.0000 mg | DELAYED_RELEASE_CAPSULE | Freq: Every day | ORAL | 0 refills | Status: DC

## 2021-06-05 MED ORDER — ALUM & MAG HYDROXIDE-SIMETH 200-200-20 MG/5ML PO SUSP
30.0000 mL | Freq: Once | ORAL | Status: AC
  Administered 2021-06-05: 30 mL via ORAL
  Filled 2021-06-05: qty 30

## 2021-06-05 NOTE — Patient Instructions (Signed)
  [name], thank you for joining Karrie Meres, PA-C for today's virtual visit.  While this provider is not your primary care provider (PCP), if your PCP is located in our provider database this encounter information will be shared with them immediately following your visit.  Consent: (Patient) Joan Thomas provided verbal consent for this virtual visit at the beginning of the encounter.  Current Medications:  Current Outpatient Medications:    loratadine (CLARITIN) 10 MG tablet, Take 1 tablet (10 mg total) by mouth daily., Disp: 90 tablet, Rfl: 4   Medications ordered in this encounter:  No orders of the defined types were placed in this encounter.    *If you need refills on other medications prior to your next appointment, please contact your pharmacy*  Follow-Up: Call back or seek an in-person evaluation if the symptoms worsen or if the condition fails to improve as anticipated.  Other Instructions Please seek an in person evaluation today.    If you have been instructed to have an in-person evaluation today at a local Urgent Care facility, please use the link below. It will take you to a list of all of our available [facility], including address, phone number and hours of operation. Please do not delay care.  [facility]  If you or a family member do not have a primary care provider, use the link below to schedule a visit and establish care. When you choose a [facility] primary care physician or advanced practice provider, you gain a long-term partner in health. Find a Primary Care Provider  Learn more about [facility] in-office and virtual care options: [facility] - Get Care Now

## 2021-06-05 NOTE — Progress Notes (Signed)
[name], scheidegger are scheduled for a virtual visit with your provider today.    Just as we do with appointments in the office, we must obtain your consent to participate.  Your consent will be active for this visit and any virtual visit you may have with one of our providers in the next 365 days.    If you have a MyChart account, I can also send a copy of this consent to you electronically.  All virtual visits are billed to your insurance company just like a traditional visit in the office.  As this is a virtual visit, video technology does not allow for your provider to perform a traditional examination.  This may limit your provider's ability to fully assess your condition.  If your provider identifies any concerns that need to be evaluated in person or the need to arrange testing such as labs, EKG, etc, we will make arrangements to do so.    Although advances in technology are sophisticated, we cannot ensure that it will always work on either your end or our end.  If the connection with a video visit is poor, we may have to switch to a telephone visit.  With either a video or telephone visit, we are not always able to ensure that we have a secure connection.   I need to obtain your verbal consent now.   Are you willing to proceed with your visit today?   Joan Thomas has provided verbal consent on 06/05/2021 for a virtual visit (video or telephone).   Karrie Meres, PA-C 06/05/2021  3:14 PM   Date:  06/05/2021   ID:  Joan Thomas, DOB [birth year], MRN [id number]  Patient Location: Home Provider Location: Home Office   Participants: Patient and Provider for Visit and Wrap up  Method of visit: Video  Location of Patient: Home Location of Provider: Home Office Consent was obtain for visit over the video. Services rendered by provider: Visit was performed via video  A video enabled telemedicine application was used and I verified that I am speaking with the  correct person using two identifiers.  PCP:  Sandford Craze, NP   Chief Complaint:  abd pain  History of Present Illness:    Joan Thomas is a 35 y.o. female with history as stated below. Presents video telehealth for an acute care visit  Pt is c/o abd pain that started this morning. She states sxs started after she ate a boiled egg that had been sitting out for about 2 days and was not refrigerated. She tried taking some tylenol and became having vomiting. She further tried omeprazole but states this did not improve symptoms and was not able to keep it down. Denies diarrhea, constipation, urinary sxs, fevers, chills. Denies any h/o abd surgeries. Denies any chance of pregnancy. Rates pain 4-5/10.  Past Medical, Surgical, Social History, Allergies, and Medications have been Reviewed.  Past Medical History:  Diagnosis Date   Anxiety    GERD (gastroesophageal reflux disease)    Vaginal Pap smear, abnormal     No outpatient medications have been marked as taking for the 06/05/21 encounter (Video Visit) with Devany Aja S, PA-C.     Allergies:   Patient has no known allergies.   ROS See HPI for history of present illness.  Physical Exam HENT:     Nose: Nose normal.  Pulmonary:     Effort: Pulmonary effort is normal.  Neurological:     Mental Status: She is  alert.         MDM: Pt with epigastric abd pain, nv. No prior hx of abd surgeries. Advised that I am unable to r/o an abd emergency w/o completing a physical exam. I recommended she be seen in person for further evaluation.       There are no diagnoses linked to this encounter.   Time:   Today, I have spent 8 minutes with the patient with telehealth technology discussing the above problems, reviewing the chart, previous notes, medications and orders.    Tests Ordered: No orders of the defined types were placed in this encounter.   Medication Changes: No orders of the defined types were placed in  this encounter.    Disposition:  Follow up  Signed, Karrie Meres, PA-C  06/05/2021 3:14 PM

## 2021-06-05 NOTE — Discharge Instructions (Addendum)
Do not drink ANY alcohol.

## 2021-06-05 NOTE — ED Triage Notes (Signed)
Pt states mid/upper abdominal pain that started this morning, denies any fever, h/o GERD.  Denies any other symtoms

## 2021-06-05 NOTE — ED Notes (Signed)
  Patient given some water for PO challenge.  Patient tolerated well but still endorses RUQ pain.

## 2021-06-05 NOTE — ED Notes (Signed)
Patient drove herself to the ED and declined the morphine.

## 2021-06-05 NOTE — ED Provider Notes (Signed)
[facility] EMERGENCY DEPARTMENT Provider Note   CSN: [id number] Arrival date & time: 06/05/21  1542     History Chief Complaint  Patient presents with   Abdominal Pain    Joan Thomas is a 35 y.o. female.  Pt presents to the ED today with abdominal pain.  She said it started this morning.  She has a hx of GERD and takes omeprazole prn.  Pt denies any fevers.  She has had n/v.      Past Medical History:  Diagnosis Date   Anxiety    GERD (gastroesophageal reflux disease)    Vaginal Pap smear, abnormal     Patient Active Problem List   Diagnosis Date Noted   High risk HPV infection 07/20/2019   Dysphagia    Gastroesophageal reflux disease     Past Surgical History:  Procedure Laterality Date   ECTOPIC PREGNANCY SURGERY     ESOPHAGEAL MANOMETRY N/A 07/28/2017   Procedure: ESOPHAGEAL MANOMETRY (EM);  Surgeon: Napoleon FormNandigam, Kavitha V, MD;  Location: [facility];  Service: Endoscopy;  Laterality: N/A;   PH IMPEDANCE STUDY N/A 07/28/2017   Procedure: PH IMPEDANCE STUDY;  Surgeon: Napoleon FormNandigam, Kavitha V, MD;  Location: [facility];  Service: Endoscopy;  Laterality: N/A;     OB History     Gravida  1   Para      Term      Preterm      AB      Living         SAB      IAB      Ectopic      Multiple      Live Births              Family History  Problem Relation Age of Onset   Hypertension Mother    Arthritis Maternal Grandmother    GER disease Maternal Grandmother    Gout Maternal Grandmother    Osteoporosis Maternal Grandmother    Diabetes Maternal Grandfather    Heart disease Maternal Grandfather    Sleep apnea Maternal Grandfather    Kidney failure Paternal Grandmother    Breast cancer Paternal Grandmother    COPD Paternal Grandmother    Prostate cancer Paternal Grandfather        several types   Breast cancer Maternal Aunt    Prostate cancer Maternal Uncle    Hypertension Sister        thrombocytosis   Alcohol abuse  Neg Hx    Asthma Neg Hx    Birth defects Neg Hx    Depression Neg Hx    Drug abuse Neg Hx    Early death Neg Hx    Hearing loss Neg Hx    Hyperlipidemia Neg Hx    Kidney disease Neg Hx    Learning disabilities Neg Hx    Mental illness Neg Hx    Mental retardation Neg Hx    Miscarriages / Stillbirths Neg Hx    Stroke Neg Hx    Vision loss Neg Hx     Social History   Tobacco Use   Smoking status: Some Days    Packs/day: 0.50    Types: Cigarettes   Smokeless tobacco: Never   Tobacco comments:    trying to quit  Vaping Use   Vaping Use: Some days   Substances: CBD, Flavoring, Synthetic cannabinoids   Devices: hooka  Substance Use Topics   Alcohol use: Yes    Alcohol/week: 7.0 standard drinks  Types: 2 Glasses of wine, 5 Shots of liquor per week    Comment: weekly   Drug use: No    Home Medications Prior to Admission medications   Medication Sig Start Date End Date Taking? Authorizing Provider  HYDROcodone-acetaminophen (NORCO/VICODIN) 5-325 MG tablet Take 1 tablet by mouth every 4 (four) hours as needed. 06/05/21  Yes Jacalyn Lefevre, MD  omeprazole (PRILOSEC) 20 MG capsule Take 1 capsule (20 mg total) by mouth daily. 06/05/21  Yes Jacalyn Lefevre, MD  ondansetron (ZOFRAN ODT) 4 MG disintegrating tablet Take 1 tablet (4 mg total) by mouth every 8 (eight) hours as needed for nausea or vomiting. 06/05/21  Yes Jacalyn Lefevre, MD  loratadine (CLARITIN) 10 MG tablet Take 1 tablet (10 mg total) by mouth daily. 11/22/20   Sandford Craze, NP    Allergies    Patient has no known allergies.  Review of Systems   Review of Systems  Gastrointestinal:  Positive for abdominal pain, nausea and vomiting.  All other systems reviewed and are negative.  Physical Exam Updated Vital Signs BP 133/80   Pulse 77   Temp 98.8 F (37.1 C) (Oral)   Resp 18   Ht 5\' 4"  (1.626 m)   Wt 74.8 kg   SpO2 100%   BMI 28.32 kg/m   Physical Exam Vitals and nursing note reviewed.   Constitutional:      Appearance: She is well-developed.  HENT:     Head: Normocephalic and atraumatic.     Mouth/Throat:     Mouth: Mucous membranes are dry.  Eyes:     Extraocular Movements: Extraocular movements intact.     Pupils: Pupils are equal, round, and reactive to light.  Cardiovascular:     Rate and Rhythm: Normal rate and regular rhythm.  Pulmonary:     Effort: Pulmonary effort is normal.     Breath sounds: Normal breath sounds.  Abdominal:     General: Abdomen is flat.     Palpations: Abdomen is soft.     Tenderness: There is abdominal tenderness in the epigastric area.  Skin:    General: Skin is warm.     Capillary Refill: Capillary refill takes less than 2 seconds.  Neurological:     General: No focal deficit present.     Mental Status: She is alert and oriented to person, place, and time.  Psychiatric:        Mood and Affect: Mood normal.        Behavior: Behavior normal.    ED Results / Procedures / Treatments   Labs (all labs ordered are listed, but only abnormal results are displayed) Labs Reviewed  COMPREHENSIVE METABOLIC PANEL - Abnormal; Notable for the following components:      Result Value   Glucose, Bld 110 (*)    AST 217 (*)    ALT 87 (*)    All other components within normal limits  LIPASE, BLOOD - Abnormal; Notable for the following components:   Lipase 137 (*)    All other components within normal limits  URINALYSIS, ROUTINE W REFLEX MICROSCOPIC - Abnormal; Notable for the following components:   APPearance CLOUDY (*)    Specific Gravity, Urine >1.030 (*)    All other components within normal limits  CBC WITH DIFFERENTIAL/PLATELET  PREGNANCY, URINE    EKG None  Radiology CT ABDOMEN PELVIS W CONTRAST  Result Date: 06/05/2021 CLINICAL DATA:  Mid upper abdominal pain since this a.m. EXAM: CT ABDOMEN AND PELVIS WITH CONTRAST TECHNIQUE: Multidetector  CT imaging of the abdomen and pelvis was performed using the standard protocol  following bolus administration of intravenous contrast. CONTRAST:  OMNIPAQUE IOHEXOL 300 MG/ML  SOLN COMPARISON:  Same-day right upper quadrant ultrasound. FINDINGS: Lower chest: No acute abnormality. Hepatobiliary: Diffuse hepatic steatosis. Gallbladder is unremarkable. No biliary ductal dilation. Pancreas: Inflammatory stranding head of pancreas the epicenter along the pancreaticoduodenal groove with some edema the pancreatic parenchyma in this area. No pancreatic ductal dilation. Spleen: Within normal limits. Adrenals/Urinary Tract: Adrenal glands are unremarkable. Kidneys are normal, without renal calculi, solid enhancing lesion, or hydronephrosis. Bladder is unremarkable degree of distension. Stomach/Bowel: Ingested tablets layering stomach. Wall thickening of the gastric antrum extending to the third part of the duodenum adjacent inflammatory stranding. No pathologic dilation of small bowel. The appendix and terminal ileum are unremarkable. Diffuse fatty infiltration the colonic wall. Few scattered colonic diverticula without findings of acute diverticulitis. Vascular/Lymphatic: No significant vascular findings are present. No pathologically enlarged abdominal or pelvic lymph nodes. Reproductive: Enhancing 4.2 cm soft tissue mass extending from the uterine parenchyma likely reflecting uterine leiomyoma. Right ovarian corpus luteum. Left ovary is unremarkable. Other: Peripancreatic and para duodenal inflammatory stranding which extends along the mesenteric root. No walled off fluid collections. No pneumoperitoneum. No portal venous gas. Musculoskeletal: No acute or significant osseous findings. IMPRESSION: 1. Inflammatory stranding along the head of the pancreas, with the epicenter along the pancreaticoduodenal groove, with some edema of the pancreatic parenchyma in this area. 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 No walled off  fluid collection or evidence of pancreatic necrosis. 2. Wall thickening of the gastric antrum extending to the third part of the duodenum with adjacent inflammatory stranding, suggestive of reactive gastritis/duodenitis. 3. Diffuse hepatic steatosis. Electronically Signed   By: Maudry Mayhew MD   On: 06/05/2021 21:42   US Abdomen Limited RUQ (LIVER/GB)  Result Date: 06/05/2021 CLINICAL DATA:  Abdominal pain with nausea and vomiting, initial encounter EXAM: ULTRASOUND ABDOMEN LIMITED RIGHT UPPER QUADRANT COMPARISON:  None. FINDINGS: Gallbladder: No gallstones or wall thickening visualized. No sonographic Murphy sign noted by sonographer. Common bile duct: Diameter: 3.3 mm. Liver: Diffusely increased in echogenicity without focal mass. These changes are consistent with fatty infiltration. Portal vein is patent on color Doppler imaging with normal direction of blood flow towards the liver. Other: None. IMPRESSION: Fatty infiltration of the liver.  No acute abnormality noted. Electronically Signed   By: Alcide Clever M.D.   On: 06/05/2021 19:46    Procedures Procedures   Medications Ordered in ED Medications  morphine 4 MG/ML injection 4 mg (4 mg Intravenous Patient Refused/Not Given 06/05/21 2052)  sodium chloride 0.9 % bolus 1,000 mL (0 mLs Intravenous Stopped 06/05/21 2001)  ondansetron (ZOFRAN) injection 4 mg (4 mg Intravenous Given 06/05/21 1855)  famotidine (PEPCID) IVPB 20 mg premix (0 mg Intravenous Stopped 06/05/21 1940)  alum & mag hydroxide-simeth (MAALOX/MYLANTA) 200-200-20 MG/5ML suspension 30 mL (30 mLs Oral Given 06/05/21 2047)  sodium chloride 0.9 % bolus 1,000 mL (1,000 mLs Intravenous New Bag/Given 06/05/21 2049)  ondansetron (ZOFRAN) injection 4 mg (4 mg Intravenous Given 06/05/21 2045)  iohexol (OMNIPAQUE) 300 MG/ML solution 100 mL (100 mLs Intravenous Contrast Given 06/05/21 2109)    ED Course  I have reviewed the triage vital signs and the nursing notes.  Pertinent labs & imaging results  that were available during my care of the patient were reviewed by me and considered in my medical decision making (see  chart for details).    MDM Rules/Calculators/A&P                           Pt does have pancreatitis/duodenitis/gastritis.  GB looks ok.  She admits to drinking daily; sometimes heavily.  She is offered admission for pain control, but wants to go home.  She knows that she can come back if needed.  She is told to not drink alcohol.  She is referred to GI.  She is to f/u with pcp. Final Clinical Impression(s) / ED Diagnoses Final diagnoses:  Abdominal pain  Alcohol-induced acute pancreatitis without infection or necrosis  Acute alcoholic gastritis without hemorrhage    Rx / DC Orders ED Discharge Orders          Ordered    omeprazole (PRILOSEC) 20 MG capsule  Daily        06/05/21 2155    HYDROcodone-acetaminophen (NORCO/VICODIN) 5-325 MG tablet  Every 4 hours PRN        06/05/21 2155    ondansetron (ZOFRAN ODT) 4 MG disintegrating tablet  Every 8 hours PRN        06/05/21 2155    Ambulatory referral to Gastroenterology        06/05/21 2157             Jacalyn Lefevre, MD 06/05/21 2159

## 2021-06-07 ENCOUNTER — Ambulatory Visit: Payer: BC Managed Care – PPO | Admitting: Gastroenterology

## 2021-06-07 ENCOUNTER — Other Ambulatory Visit: Payer: Self-pay

## 2021-06-07 ENCOUNTER — Encounter: Payer: Self-pay | Admitting: Nurse Practitioner

## 2021-06-07 ENCOUNTER — Telehealth: Payer: Self-pay | Admitting: Nurse Practitioner

## 2021-06-07 ENCOUNTER — Emergency Department (HOSPITAL_BASED_OUTPATIENT_CLINIC_OR_DEPARTMENT_OTHER)
Admission: EM | Admit: 2021-06-07 | Discharge: 2021-06-07 | Disposition: A | Discharge status: Home / Self Care | Payer: BC Managed Care – PPO | Attending: Emergency Medicine | Admitting: Emergency Medicine

## 2021-06-07 ENCOUNTER — Encounter (HOSPITAL_BASED_OUTPATIENT_CLINIC_OR_DEPARTMENT_OTHER): Payer: Self-pay

## 2021-06-07 ENCOUNTER — Telehealth: Payer: BC Managed Care – PPO | Admitting: Emergency Medicine

## 2021-06-07 ENCOUNTER — Other Ambulatory Visit (HOSPITAL_BASED_OUTPATIENT_CLINIC_OR_DEPARTMENT_OTHER): Payer: Self-pay

## 2021-06-07 DIAGNOSIS — R109 Unspecified abdominal pain: Secondary | ICD-10-CM | POA: Diagnosis not present

## 2021-06-07 DIAGNOSIS — K859 Acute pancreatitis without necrosis or infection, unspecified: Secondary | ICD-10-CM | POA: Diagnosis not present

## 2021-06-07 DIAGNOSIS — R1012 Left upper quadrant pain: Secondary | ICD-10-CM | POA: Diagnosis not present

## 2021-06-07 DIAGNOSIS — F17210 Nicotine dependence, cigarettes, uncomplicated: Secondary | ICD-10-CM | POA: Diagnosis not present

## 2021-06-07 DIAGNOSIS — R1013 Epigastric pain: Secondary | ICD-10-CM

## 2021-06-07 HISTORY — DX: Acute pancreatitis without necrosis or infection, unspecified: K85.90

## 2021-06-07 LAB — URINALYSIS, ROUTINE W REFLEX MICROSCOPIC
Glucose, UA: NEGATIVE mg/dL
Hgb urine dipstick: NEGATIVE
Ketones, ur: 15 mg/dL — AB
Nitrite: NEGATIVE
Protein, ur: NEGATIVE mg/dL
Specific Gravity, Urine: 1.02 (ref 1.005–1.030)
pH: 6 (ref 5.0–8.0)

## 2021-06-07 LAB — COMPREHENSIVE METABOLIC PANEL
ALT: 42 U/L (ref 0–44)
AST: 46 U/L — ABNORMAL HIGH (ref 15–41)
Albumin: 3.6 g/dL (ref 3.5–5.0)
Alkaline Phosphatase: 81 U/L (ref 38–126)
Anion gap: 11 (ref 5–15)
BUN: <5 mg/dL — ABNORMAL LOW (ref 6–20)
CO2: 26 mmol/L (ref 22–32)
Calcium: 8.7 mg/dL — ABNORMAL LOW (ref 8.9–10.3)
Chloride: 96 mmol/L — ABNORMAL LOW (ref 98–111)
Creatinine, Ser: 0.68 mg/dL (ref 0.44–1.00)
GFR, Estimated: >60 mL/min (ref >60)
Glucose, Bld: 112 mg/dL — ABNORMAL HIGH (ref 70–99)
Potassium: 3.3 mmol/L — ABNORMAL LOW (ref 3.5–5.1)
Sodium: 133 mmol/L — ABNORMAL LOW (ref 135–145)
Total Bilirubin: 0.9 mg/dL (ref 0.3–1.2)
Total Protein: 7.3 g/dL (ref 6.5–8.1)

## 2021-06-07 LAB — URINALYSIS, MICROSCOPIC (REFLEX)

## 2021-06-07 LAB — CBC
HCT: 38.6 % (ref 36.0–46.0)
Hemoglobin: 12.9 g/dL (ref 12.0–15.0)
MCH: 32.3 pg (ref 26.0–34.0)
MCHC: 33.4 g/dL (ref 30.0–36.0)
MCV: 96.7 fL (ref 80.0–100.0)
Platelets: 206 10*3/uL (ref 150–400)
RBC: 3.99 MIL/uL (ref 3.87–5.11)
RDW: 13.3 % (ref 11.5–15.5)
WBC: 10.7 10*3/uL — ABNORMAL HIGH (ref 4.0–10.5)
nRBC: 0 % (ref 0.0–0.2)

## 2021-06-07 LAB — LIPASE, BLOOD: Lipase: 87 U/L — ABNORMAL HIGH (ref 11–51)

## 2021-06-07 LAB — PREGNANCY, URINE: Preg Test, Ur: NEGATIVE

## 2021-06-07 MED ORDER — ONDANSETRON 4 MG PO TBDP
4.0000 mg | ORAL_TABLET | Freq: Three times a day (TID) | ORAL | 0 refills | Status: DC | PRN
  Filled 2021-06-07: qty 20, 7d supply, fill #0

## 2021-06-07 MED ORDER — HYDROCODONE-ACETAMINOPHEN 5-325 MG PO TABS
1.0000 | ORAL_TABLET | ORAL | 0 refills | Status: DC | PRN
  Filled 2021-06-07: qty 12, 2d supply, fill #0

## 2021-06-07 NOTE — Telephone Encounter (Signed)
No answer. No voicemail. 

## 2021-06-07 NOTE — Progress Notes (Signed)
Virtual Visit Consent   Joan Thomas, you are scheduled for a virtual visit with a [facility] provider today.     Just as with appointments in the office, your consent must be obtained to participate.  Your consent will be active for this visit and any virtual visit you may have with one of our providers in the next 365 days.     If you have a MyChart account, a copy of this consent can be sent to you electronically.  All virtual visits are billed to your insurance company just like a traditional visit in the office.    As this is a virtual visit, video technology does not allow for your provider to perform a traditional examination.  This may limit your provider's ability to fully assess your condition.  If your provider identifies any concerns that need to be evaluated in person or the need to arrange testing (such as labs, EKG, etc.), we will make arrangements to do so.     Although advances in technology are sophisticated, we cannot ensure that it will always work on either your end or our end.  If the connection with a video visit is poor, the visit may have to be switched to a telephone visit.  With either a video or telephone visit, we are not always able to ensure that we have a secure connection.     I need to obtain your verbal consent now.   Are you willing to proceed with your visit today?    Joan Thomas has provided verbal consent on 06/07/2021 for a virtual visit video.   Roxy Horseman, PA-C   Date: 06/07/2021 12:54 PM   Virtual Visit via Video Note   I, Roxy Horseman, connected with  Joan Thomas  ([id number], 1985-12-21) on 06/07/21 at  1:00 PM EDT by a video-enabled telemedicine application and verified that I am speaking with the correct person using two identifiers.  Location: Patient: Virtual Visit Location Patient: Home Provider: Virtual Visit Location Provider: Home Office   I discussed the limitations of evaluation and  management by telemedicine and the availability of in person appointments. The patient expressed understanding and agreed to proceed.    History of Present Illness: Joan Thomas is a 35 y.o. who identifies as a female who was assigned female at birth, and is being seen today for CC of abdominal pain and vomiting.  Was recently diagnosed with pancreatitis.  Reports subjective fevers and chills.    HPI: HPI  Problems:  Patient Active Problem List   Diagnosis Date Noted   High risk HPV infection 07/20/2019   Dysphagia    Gastroesophageal reflux disease     Allergies: No Known Allergies Medications:  Current Outpatient Medications:    HYDROcodone-acetaminophen (NORCO/VICODIN) 5-325 MG tablet, Take 1 tablet by mouth every 4 (four) hours as needed., Disp: 10 tablet, Rfl: 0   loratadine (CLARITIN) 10 MG tablet, Take 1 tablet (10 mg total) by mouth daily., Disp: 90 tablet, Rfl: 4   omeprazole (PRILOSEC) 20 MG capsule, Take 1 capsule (20 mg total) by mouth daily., Disp: 30 capsule, Rfl: 0   ondansetron (ZOFRAN ODT) 4 MG disintegrating tablet, Take 1 tablet (4 mg total) by mouth every 8 (eight) hours as needed for nausea or vomiting., Disp: 20 tablet, Rfl: 0  Observations/Objective: Patient is well-developed, well-nourished in no acute distress.  Resting comfortably at home.  Head is normocephalic, atraumatic.  No labored breathing.  Speech is clear and coherent  with logical content.  Patient is alert and oriented at baseline.    Assessment and Plan: 1. Epigastric pain  Redirect to ED due to recent diagnosis of pancreatitis and persistent pain. Follow Up Instructions: I discussed the assessment and treatment plan with the patient. The patient was provided an opportunity to ask questions and all were answered. The patient agreed with the plan and demonstrated an understanding of the instructions.  A copy of instructions were sent to the patient via MyChart.  The patient was  advised to call back or seek an in-person evaluation if the symptoms worsen or if the condition fails to improve as anticipated.  Time:  I spent 12 minutes with the patient via telehealth technology discussing the above problems/concerns.    Roxy Horseman, PA-C

## 2021-06-07 NOTE — Telephone Encounter (Signed)
Pt is scheduled for ED f/u with Gunnar Fusi on 8/26  but is requesting something for pain. She was seen at the ED on 8/9 and was given some pain meds but is running low. Pls call her.

## 2021-06-07 NOTE — ED Provider Notes (Signed)
[facility] EMERGENCY DEPARTMENT Provider Note   CSN: [id number] Arrival date & time: 06/07/21  1325     History Chief Complaint  Patient presents with   Abdominal Pain    Joan Thomas is a 35 y.o. female seen 2 days ago for abdominal pain. She was diagnosed with pancreatitis. She declined admission and was sent home with pain medication, omeprazole and Zofran.  She was told to follow a clear liquid diet and has been doing such.  She actually has had diminishment of her pain in the epigastric region but does have some irritation around the region of the diaphragm globally.  She has had decrease in her vomiting.  She was supposed to follow-up with a GI specialist today however there was some miscommunication about her appointment and she now has a follow-up on Monday.  She was hoping to get some more pain medication to help last her through the week until she could see her GI specialist.  She denies fevers or chills.   Abdominal Pain     Past Medical History:  Diagnosis Date   Anxiety    GERD (gastroesophageal reflux disease)    Pancreatitis    Vaginal Pap smear, abnormal     Patient Active Problem List   Diagnosis Date Noted   High risk HPV infection 07/20/2019   Dysphagia    Gastroesophageal reflux disease     Past Surgical History:  Procedure Laterality Date   ECTOPIC PREGNANCY SURGERY     ESOPHAGEAL MANOMETRY N/A 07/28/2017   Procedure: ESOPHAGEAL MANOMETRY (EM);  Surgeon: Napoleon Form, MD;  Location: [facility];  Service: Endoscopy;  Laterality: N/A;   PH IMPEDANCE STUDY N/A 07/28/2017   Procedure: PH IMPEDANCE STUDY;  Surgeon: Napoleon Form, MD;  Location: [facility];  Service: Endoscopy;  Laterality: N/A;     OB History     Gravida  1   Para      Term      Preterm      AB      Living         SAB      IAB      Ectopic      Multiple      Live Births              Family History  Problem Relation Age  of Onset   Hypertension Mother    Arthritis Maternal Grandmother    GER disease Maternal Grandmother    Gout Maternal Grandmother    Osteoporosis Maternal Grandmother    Diabetes Maternal Grandfather    Heart disease Maternal Grandfather    Sleep apnea Maternal Grandfather    Kidney failure Paternal Grandmother    Breast cancer Paternal Grandmother    COPD Paternal Grandmother    Prostate cancer Paternal Grandfather        several types   Breast cancer Maternal Aunt    Prostate cancer Maternal Uncle    Hypertension Sister        thrombocytosis   Alcohol abuse Neg Hx    Asthma Neg Hx    Birth defects Neg Hx    Depression Neg Hx    Drug abuse Neg Hx    Early death Neg Hx    Hearing loss Neg Hx    Hyperlipidemia Neg Hx    Kidney disease Neg Hx    Learning disabilities Neg Hx    Mental illness Neg Hx    Mental retardation Neg Hx  Miscarriages / Stillbirths Neg Hx    Stroke Neg Hx    Vision loss Neg Hx     Social History   Tobacco Use   Smoking status: Every Day    Packs/day: 0.50    Types: Cigarettes   Smokeless tobacco: Never   Tobacco comments:    None since 06/05/21  Vaping Use   Vaping Use: Former   Substances: CBD, Flavoring, Synthetic cannabinoids   Devices: hooka  Substance Use Topics   Alcohol use: Yes    Alcohol/week: 7.0 standard drinks    Types: 2 Glasses of wine, 5 Shots of liquor per week    Comment: daily-none since dx pancreatitis   Drug use: No    Home Medications Prior to Admission medications   Medication Sig Start Date End Date Taking? Authorizing Provider  HYDROcodone-acetaminophen (NORCO/VICODIN) 5-325 MG tablet Take 1 tablet by mouth every 4 (four) hours as needed. 06/05/21   Jacalyn Lefevre, MD  loratadine (CLARITIN) 10 MG tablet Take 1 tablet (10 mg total) by mouth daily. 11/22/20   Sandford Craze, NP  omeprazole (PRILOSEC) 20 MG capsule Take 1 capsule (20 mg total) by mouth daily. 06/05/21   Jacalyn Lefevre, MD  ondansetron (ZOFRAN  ODT) 4 MG disintegrating tablet Take 1 tablet (4 mg total) by mouth every 8 (eight) hours as needed for nausea or vomiting. 06/05/21   Jacalyn Lefevre, MD    Allergies    Patient has no known allergies.  Review of Systems   Review of Systems  Gastrointestinal:  Positive for abdominal pain.  Ten systems reviewed and are negative for acute change, except as noted in the HPI.   Physical Exam Updated Vital Signs BP 133/90 (BP Location: Right Arm)   Pulse 97   Temp 99.2 F (37.3 C) (Oral)   Resp 18   Ht 5\' 4"  (1.626 m)   Wt 72.1 kg   LMP 05/16/2021   SpO2 99%   BMI 27.29 kg/m   Physical Exam Vitals and nursing note reviewed.  Constitutional:      General: She is not in acute distress.    Appearance: She is well-developed. She is not diaphoretic.  HENT:     Head: Normocephalic and atraumatic.     Right Ear: External ear normal.     Left Ear: External ear normal.     Nose: Nose normal.     Mouth/Throat:     Mouth: Mucous membranes are moist.  Eyes:     General: No scleral icterus.    Conjunctiva/sclera: Conjunctivae normal.  Cardiovascular:     Rate and Rhythm: Normal rate and regular rhythm.     Heart sounds: Normal heart sounds. No murmur heard.   No friction rub. No gallop.  Pulmonary:     Effort: Pulmonary effort is normal. No respiratory distress.     Breath sounds: Normal breath sounds.  Abdominal:     General: Bowel sounds are normal. There is no distension.     Palpations: Abdomen is soft. There is no mass.     Tenderness: There is abdominal tenderness in the epigastric area and left upper quadrant. There is no guarding.  Musculoskeletal:     Cervical back: Normal range of motion.  Skin:    General: Skin is warm and dry.  Neurological:     Mental Status: She is alert and oriented to person, place, and time.  Psychiatric:        Behavior: Behavior normal.    ED Results / Procedures /  Treatments   Labs (all labs ordered are listed, but only abnormal results  are displayed) Labs Reviewed  LIPASE, BLOOD - Abnormal; Notable for the following components:      Result Value   Lipase 87 (*)    All other components within normal limits  COMPREHENSIVE METABOLIC PANEL - Abnormal; Notable for the following components:   Sodium 133 (*)    Potassium 3.3 (*)    Chloride 96 (*)    Glucose, Bld 112 (*)    BUN <5 (*)    Calcium 8.7 (*)    AST 46 (*)    All other components within normal limits  CBC - Abnormal; Notable for the following components:   WBC 10.7 (*)    All other components within normal limits  URINALYSIS, ROUTINE W REFLEX MICROSCOPIC - Abnormal; Notable for the following components:   APPearance HAZY (*)    Bilirubin Urine SMALL (*)    Ketones, ur 15 (*)    Leukocytes,Ua TRACE (*)    All other components within normal limits  URINALYSIS, MICROSCOPIC (REFLEX) - Abnormal; Notable for the following components:   Bacteria, UA MANY (*)    All other components within normal limits  PREGNANCY, URINE    EKG None  Radiology CT ABDOMEN PELVIS W CONTRAST  Result Date: 06/05/2021 CLINICAL DATA:  Mid upper abdominal pain since this a.m. EXAM: CT ABDOMEN AND PELVIS WITH CONTRAST TECHNIQUE: Multidetector CT imaging of the abdomen and pelvis was performed using the standard protocol following bolus administration of intravenous contrast. CONTRAST:  OMNIPAQUE IOHEXOL 300 MG/ML  SOLN COMPARISON:  Same-day right upper quadrant ultrasound. FINDINGS: Lower chest: No acute abnormality. Hepatobiliary: Diffuse hepatic steatosis. Gallbladder is unremarkable. No biliary ductal dilation. Pancreas: Inflammatory stranding head of pancreas the epicenter along the pancreaticoduodenal groove with some edema the pancreatic parenchyma in this area. No pancreatic ductal dilation. Spleen: Within normal limits. Adrenals/Urinary Tract: Adrenal glands are unremarkable. Kidneys are normal, without renal calculi, solid enhancing lesion, or hydronephrosis. Bladder is  unremarkable degree of distension. Stomach/Bowel: Ingested tablets layering stomach. Wall thickening of the gastric antrum extending to the third part of the duodenum adjacent inflammatory stranding. No pathologic dilation of small bowel. The appendix and terminal ileum are unremarkable. Diffuse fatty infiltration the colonic wall. Few scattered colonic diverticula without findings of acute diverticulitis. Vascular/Lymphatic: No significant vascular findings are present. No pathologically enlarged abdominal or pelvic lymph nodes. Reproductive: Enhancing 4.2 cm soft tissue mass extending from the uterine parenchyma likely reflecting uterine leiomyoma. Right ovarian corpus luteum. Left ovary is unremarkable. Other: Peripancreatic and para duodenal inflammatory stranding which extends along the mesenteric root. No walled off fluid collections. No pneumoperitoneum. No portal venous gas. Musculoskeletal: No acute or significant osseous findings. IMPRESSION: 1. Inflammatory stranding along the head of the pancreas, with the epicenter along the pancreaticoduodenal groove, with some edema of the pancreatic parenchyma in this area. 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 No walled off fluid collection or evidence of pancreatic necrosis. 2. Wall thickening of the gastric antrum extending to the third part of the duodenum with adjacent inflammatory stranding, suggestive of reactive gastritis/duodenitis. 3. Diffuse hepatic steatosis. Electronically Signed   By: Maudry Mayhew MD   On: 06/05/2021 21:42   US Abdomen Limited RUQ (LIVER/GB)  Result Date: 06/05/2021 CLINICAL DATA:  Abdominal pain with nausea and vomiting, initial encounter EXAM: ULTRASOUND ABDOMEN LIMITED RIGHT UPPER QUADRANT COMPARISON:  None. FINDINGS: Gallbladder: No gallstones or wall  thickening visualized. No sonographic Murphy sign noted by sonographer. Common bile duct: Diameter: 3.3  mm. Liver: Diffusely increased in echogenicity without focal mass. These changes are consistent with fatty infiltration. Portal vein is patent on color Doppler imaging with normal direction of blood flow towards the liver. Other: None. IMPRESSION: Fatty infiltration of the liver.  No acute abnormality noted. Electronically Signed   By: Alcide CleverMark  Lukens M.D.   On: 06/05/2021 19:46    Procedures Procedures   Medications Ordered in ED Medications - No data to display  ED Course  I have reviewed the triage vital signs and the nursing notes.  Pertinent labs & imaging results that were available during my care of the patient were reviewed by me and considered in my medical decision making (see chart for details).    MDM Rules/Calculators/A&P                            Final Clinical Impression(s) / ED Diagnoses Final diagnoses:  None    Rx / DC Orders ED Discharge Orders     None        Arthor CaptainHarris, Margel Joens, PA-C 06/07/21 1659    Virgina Norfolkuratolo, Adam, DO 06/07/21 2000

## 2021-06-07 NOTE — ED Triage Notes (Signed)
Pt c/o lower abd pain, nausea-states she was seen here 2 days ago-dx with pancreatitis-states she was advised by PCP to return to ED-NAD-steady gait

## 2021-06-07 NOTE — Patient Instructions (Signed)
Based on what you shared with me, I feel your condition warrants further evaluation as soon as possible at an Emergency department.  ? ?  ?If you are having a true medical emergency please call 911.   ?  ? ?Emergency Department-[facility]  ?Get Driving Directions  ?[phone]  ?[address]  ?[place], [place] [address]  ?Open 24/7/365  ?  ?  ?[facility] [address]  ?Get Driving Directions  ?[address]  ?[place], [place] [address]  ?Open 24/7/365  ?  ?Emergency Department- [facility]  ?Get Driving Directions  ?[phone]  ?[address]  ?[place], [place] [address]  ?Open 24/7/365  ?  ?  ?[facility]  ?Get Driving Directions  ?[phone]  ?[address]  ?[place], [place] [address]  ?Open 24/7/365  ?  ?[place]  ?Emergency Department- [facility]  ?Get Driving Directions  ?[phone]  ?[address]  ?[place], [place] [address]  ?Open 24/7/365  ?  ?[place]  ?Emergency Department- [facility]  ?Get Driving Directions  ?[address]  ?[place], [place] [address]  ?Open 24/7/365  ?  ?[place]  ?Emergency Department-  [facility]  ?Get Driving Directions  ?[phone]  ?[address]  ?[place], [place] [address]  ?Open 24/7/365  ?  ?

## 2021-06-11 ENCOUNTER — Other Ambulatory Visit (HOSPITAL_BASED_OUTPATIENT_CLINIC_OR_DEPARTMENT_OTHER): Payer: Self-pay

## 2021-06-11 ENCOUNTER — Ambulatory Visit: Payer: BC Managed Care – PPO | Admitting: Family

## 2021-06-11 ENCOUNTER — Telehealth: Payer: Self-pay | Admitting: Family

## 2021-06-11 ENCOUNTER — Other Ambulatory Visit: Payer: Self-pay

## 2021-06-11 VITALS — BP 119/78 | HR 84 | Temp 98.6°F | Resp 16 | Ht 64.0 in | Wt 165.0 lb

## 2021-06-11 DIAGNOSIS — K852 Alcohol induced acute pancreatitis without necrosis or infection: Secondary | ICD-10-CM

## 2021-06-11 DIAGNOSIS — K859 Acute pancreatitis without necrosis or infection, unspecified: Secondary | ICD-10-CM | POA: Diagnosis not present

## 2021-06-11 DIAGNOSIS — F419 Anxiety disorder, unspecified: Secondary | ICD-10-CM | POA: Diagnosis not present

## 2021-06-11 HISTORY — DX: Alcohol induced acute pancreatitis without necrosis or infection: K85.20

## 2021-06-11 LAB — COMPREHENSIVE METABOLIC PANEL
ALT: 21 U/L (ref 0–35)
AST: 20 U/L (ref 0–37)
Albumin: 3.5 g/dL (ref 3.5–5.2)
Alkaline Phosphatase: 66 U/L (ref 39–117)
BUN: 5 mg/dL — ABNORMAL LOW (ref 6–23)
CO2: 29 mEq/L (ref 19–32)
Calcium: 9 mg/dL (ref 8.4–10.5)
Chloride: 101 mEq/L (ref 96–112)
Creatinine, Ser: 0.67 mg/dL (ref 0.40–1.20)
GFR: 112.72 mL/min (ref >60.00)
Glucose, Bld: 90 mg/dL (ref 70–99)
Potassium: 3.9 mEq/L (ref 3.5–5.1)
Sodium: 137 mEq/L (ref 135–145)
Total Bilirubin: 0.3 mg/dL (ref 0.2–1.2)
Total Protein: 6.2 g/dL (ref 6.0–8.3)

## 2021-06-11 LAB — CBC WITH DIFFERENTIAL/PLATELET
Basophils Absolute: 0.1 10*3/uL (ref 0.0–0.1)
Basophils Relative: 1.1 % (ref 0.0–3.0)
Eosinophils Absolute: 0.2 10*3/uL (ref 0.0–0.7)
Eosinophils Relative: 2.8 % (ref 0.0–5.0)
HCT: 32.9 % — ABNORMAL LOW (ref 36.0–46.0)
Hemoglobin: 10.9 g/dL — ABNORMAL LOW (ref 12.0–15.0)
Lymphocytes Relative: 29.4 % (ref 12.0–46.0)
Lymphs Abs: 1.8 10*3/uL (ref 0.7–4.0)
MCHC: 33.1 g/dL (ref 30.0–36.0)
MCV: 97.1 fl (ref 78.0–100.0)
Monocytes Absolute: 0.6 10*3/uL (ref 0.1–1.0)
Monocytes Relative: 10.2 % (ref 3.0–12.0)
Neutro Abs: 3.4 10*3/uL (ref 1.4–7.7)
Neutrophils Relative %: 56.5 % (ref 43.0–77.0)
Platelets: 237 10*3/uL (ref 150.0–400.0)
RBC: 3.39 Mil/uL — ABNORMAL LOW (ref 3.87–5.11)
RDW: 14.3 % (ref 11.5–15.5)
WBC: 6 10*3/uL (ref 4.0–10.5)

## 2021-06-11 LAB — LIPASE: Lipase: 402 U/L — ABNORMAL HIGH (ref 11.0–59.0)

## 2021-06-11 MED ORDER — ALPRAZOLAM 0.25 MG PO TABS
0.2500 mg | ORAL_TABLET | Freq: Two times a day (BID) | ORAL | 0 refills | Status: DC | PRN
  Filled 2021-06-11: qty 30, 15d supply, fill #0

## 2021-06-11 NOTE — Telephone Encounter (Addendum)
Pancreatitis lab testing has worsened.  I would recommend that she see GI. If increased abdominal pain, jaundice (yellow eyes/skin), fever >101, weakness, inability to keep down food and water, then she needs to go to the ER.   I would recommend that she return to a liquid only diet for the next few days until we see her lab work improving.  Let's schedule her to draw the following daily labs for the next 2 days:  CBC with diff Cmet Amylase Lipase  Dx pancreatitis.

## 2021-06-11 NOTE — Patient Instructions (Addendum)
Please complete lab work prior to leaving.  Continue off of cigarettes and alcohol- keep up the good work. Keep your upcoming appointment with Gastroenterology. You may slowly advance your diet as tolerated.  Call if your abdominal discomfort worsens or if it does not continue to improve.

## 2021-06-11 NOTE — Progress Notes (Signed)
Subjective:   By signing my name below, I, Shehryar Baig, attest that this documentation has been prepared under the direction and in the presence of [name] NP. 06/11/2021    Patient ID: Joan Thomas, female    DOB: 09-May-1986, 35 y.o.   MRN: [id number]  Chief Complaint  Patient presents with   Anxiety    Increased anxiety    HPI Patient is in today for a office visit.  Pancreatitis- She reports going to the ED on 06/07/2021 and was diagnosed with pancreatitis. She is currently on a liquid diet and is scheduling an appointment with a GI specialist because she had missed her previous one. She reports prior to her ED visit she was drinking alcohol and smoking 7 cigarettes every day. She notes prior to her visit to the ED, she was drinking a half a gallon of vodka weekly and 2 beers. She has not had any alcohol or cigarettes since her visit on the 11th.    Anxiety- She reports episodes of anxiety while working because she is not completely trained for her job. She also reports having anxiety while driving. She reports her episodes have sudden onsets and she struggles to prepare or control them. She is requesting to start taking alprazolam again to manage her anxiety. She reports that her anxiety levels are increasing since she ran out of alprazolam.    Health Maintenance Due  Topic Date Due   Pneumococcal Vaccine 31-4 Years old (1 - PCV) Never done   HIV Screening  Never done   Hepatitis C Screening  Never done   TETANUS/TDAP  Never done   COVID-19 Vaccine (3 - Booster for Moderna series) 12/18/2020   INFLUENZA VACCINE  05/28/2021    Past Medical History:  Diagnosis Date   Anxiety    GERD (gastroesophageal reflux disease)    Pancreatitis    Vaginal Pap smear, abnormal     Past Surgical History:  Procedure Laterality Date   ECTOPIC PREGNANCY SURGERY     ESOPHAGEAL MANOMETRY N/A 07/28/2017   Procedure: ESOPHAGEAL MANOMETRY (EM);  Surgeon: Mauri Pole, MD;  Location: [facility];  Service: Endoscopy;  Laterality: N/A;   [place] IMPEDANCE STUDY N/A 07/28/2017   Procedure: [place] IMPEDANCE STUDY;  Surgeon: Mauri Pole, MD;  Location: [facility];  Service: Endoscopy;  Laterality: N/A;    Family History  Problem Relation Age of Onset   Hypertension Mother    Arthritis Maternal Grandmother    GER disease Maternal Grandmother    Gout Maternal Grandmother    Osteoporosis Maternal Grandmother    Diabetes Maternal Grandfather    Heart disease Maternal Grandfather    Sleep apnea Maternal Grandfather    Kidney failure Paternal Grandmother    Breast cancer Paternal Grandmother    COPD Paternal Grandmother    Prostate cancer Paternal Grandfather        several types   Breast cancer Maternal Aunt    Prostate cancer Maternal Uncle    Hypertension Sister        thrombocytosis   Alcohol abuse Neg Hx    Asthma Neg Hx    Birth defects Neg Hx    Depression Neg Hx    Drug abuse Neg Hx    Early death Neg Hx    Hearing loss Neg Hx    Hyperlipidemia Neg Hx    Kidney disease Neg Hx    Learning disabilities Neg Hx    Mental illness Neg Hx  Mental retardation Neg Hx    Miscarriages / Stillbirths Neg Hx    Stroke Neg Hx    Vision loss Neg Hx     Social History   Socioeconomic History   Marital status: Married    Spouse name: Not on file   Number of children: 0   Years of education: Not on file   Highest education level: Not on file  Occupational History   Not on file  Tobacco Use   Smoking status: Every Day    Packs/day: 0.50    Types: Cigarettes   Smokeless tobacco: Never   Tobacco comments:    None since 06/05/21  Vaping Use   Vaping Use: Former   Substances: CBD, Flavoring, Synthetic cannabinoids   Devices: hooka  Substance and Sexual Activity   Alcohol use: Yes    Alcohol/week: 7.0 standard drinks    Types: 2 Glasses of wine, 5 Shots of liquor per week    Comment: daily-none since dx pancreatitis   Drug use:  No   Sexual activity: Yes    Partners: Male    Birth control/protection: None  Other Topics Concern   Not on file  Social History Narrative   Worked for [employer] 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     Left her recent job   Married to [place]   No children    Has a dog   Family is out of town.    Enjoys cooking/reading.    Social Determinants of Health   Financial Resource Strain: Not on file  Food Insecurity: Not on file  Transportation Needs: Not on file  Physical Activity: Not on file  Stress: Not on file  Social Connections: Not on file  Intimate Partner Violence: Not on file    Outpatient Medications Prior to Visit  Medication Sig Dispense Refill   HYDROcodone-acetaminophen (NORCO/VICODIN) 5-325 MG tablet Take 1 tablet by mouth every 4 (four) hours as needed. 12 tablet 0   loratadine (CLARITIN) 10 MG tablet Take 1 tablet (10 mg total) by mouth daily. 90 tablet 4   omeprazole (PRILOSEC) 20 MG capsule Take 1 capsule (20 mg total) by mouth daily. 30 capsule 0   ondansetron (ZOFRAN ODT) 4 MG disintegrating tablet Take 1 tablet (4 mg total) by mouth every 8 (eight) hours as needed for nausea or vomiting. 20 tablet 0   No facility-administered medications prior to visit.    No Known Allergies  Review of Systems  Psychiatric/Behavioral:  The patient is nervous/anxious.       Objective:    Physical Exam Constitutional:      General: She is not in acute distress.    Appearance: Normal appearance. She is not ill-appearing.  HENT:     Head: Normocephalic and atraumatic.     Right Ear: External ear normal.     Left Ear: External ear normal.  Eyes:     Extraocular Movements: Extraocular movements intact.     Pupils: Pupils are equal, round, and reactive to light.  Cardiovascular:     Rate and Rhythm: Normal rate and regular rhythm.     Heart sounds: Normal heart sounds. No murmur heard.   No gallop.  Pulmonary:     Effort: Pulmonary effort  is normal. No respiratory distress.     Breath sounds: Normal breath sounds. No wheezing or rales.  Skin:    General: Skin is warm and dry.  Neurological:     Mental Status: She is alert  and oriented to person, place, and time.  Psychiatric:        Behavior: Behavior normal.        Judgment: Judgment normal.    BP 119/78 (BP Location: Right Arm, Patient Position: Sitting, Cuff Size: Small)   Pulse 84   Temp 98.6 F (37 C) (Oral)   Resp 16   Ht _0  (1.626 m)   Wt 165 lb (74.8 kg)   LMP 05/16/2021   SpO2 100%   BMI 28.32 kg/m  Wt Readings from Last 3 Encounters:  06/11/21 165 lb (74.8 kg)  06/07/21 159 lb (72.1 kg)  06/05/21 165 lb (74.8 kg)       Assessment & Plan:   Problem List Items Addressed This Visit       Unprioritized   Pancreatitis, alcoholic, acute - Primary    New. Clinically resolving.   Pt is advised as follows:    Please complete lab work prior to leaving.  Continue off of cigarettes and alcohol- keep up the good work. Keep your upcoming appointment with Gastroenterology. You may slowly advance your diet as tolerated.  Call if your abdominal discomfort worsens or if it does not continue to improve.  Pt verbalizes understanding.       Anxiety    Intermittent rare panic attacks. Pt is requesting a refill of her xanax.  Refill provided for prn use.       Relevant Medications   ALPRAZolam (XANAX) 0.25 MG tablet   RESOLVED: Acute pancreatitis   Relevant Orders   Comp Met (CMET)   CBC with Differential/Platelet   Lipase     Meds ordered this encounter  Medications   ALPRAZolam (XANAX) 0.25 MG tablet    Sig: Take 1 tablet (0.25 mg total) by mouth 2 (two) times daily as needed for anxiety.    Dispense:  30 tablet    Refill:  0    Order Specific Question:   Supervising Provider    Answer:   Penni Homans A [[id number]]    I, [name] NP, personally preformed the services described in this documentation.  All medical record entries made  by the scribe were at my direction and in my presence.  I have reviewed the chart and discharge instructions (if applicable) and agree that the record reflects my personal performance and is accurate and complete. 06/11/2021   I,Shehryar Baig,acting as a Education administrator for Nance Pear, NP.,have documented all relevant documentation on the behalf of Nance Pear, NP,as directed by  Nance Pear, NP while in the presence of Nance Pear, NP.   Nance Pear, NP

## 2021-06-11 NOTE — Telephone Encounter (Signed)
Patient advised of results, provider's advise to continue liquid diet for a few days ant to go to the ER if abd pain, fever, jaundice or unable to keep food and water down. She verbalized understanding. She was scheduled to come in for labs for the next 2 days. Same orders entered as future for 8-16 and 8-17

## 2021-06-11 NOTE — Telephone Encounter (Signed)
Attempt to call patient,unable to leave message no VM

## 2021-06-11 NOTE — Assessment & Plan Note (Signed)
Intermittent rare panic attacks. Pt is requesting a refill of her xanax.  Refill provided for prn use.

## 2021-06-11 NOTE — Assessment & Plan Note (Addendum)
New. Clinically resolving.   Pt is advised as follows:    Please complete lab work prior to leaving.  Continue off of cigarettes and alcohol- keep up the good work. Keep your upcoming appointment with Gastroenterology. You may slowly advance your diet as tolerated.  Call if your abdominal discomfort worsens or if it does not continue to improve.  Pt verbalizes understanding.

## 2021-06-12 ENCOUNTER — Other Ambulatory Visit: Payer: BC Managed Care – PPO

## 2021-06-12 NOTE — Telephone Encounter (Signed)
Patient was advised she can only have juice, water and broth with no solids in it for now.

## 2021-06-12 NOTE — Telephone Encounter (Signed)
Attempted to contact patient, no answer, no voicemail.

## 2021-06-12 NOTE — Telephone Encounter (Signed)
Pt. Called in to see what all a liquid diet consist of. She wants to know can she eat broth based items like soup, and soups with noodles, and pudding. [phone]

## 2021-06-13 ENCOUNTER — Other Ambulatory Visit: Payer: Self-pay

## 2021-06-13 ENCOUNTER — Other Ambulatory Visit (INDEPENDENT_AMBULATORY_CARE_PROVIDER_SITE_OTHER): Payer: BC Managed Care – PPO

## 2021-06-13 DIAGNOSIS — K859 Acute pancreatitis without necrosis or infection, unspecified: Secondary | ICD-10-CM

## 2021-06-13 LAB — CBC WITH DIFFERENTIAL/PLATELET
Basophils Absolute: 0 10*3/uL (ref 0.0–0.1)
Basophils Relative: 0.8 % (ref 0.0–3.0)
Eosinophils Absolute: 0.1 10*3/uL (ref 0.0–0.7)
Eosinophils Relative: 2.5 % (ref 0.0–5.0)
HCT: 33 % — ABNORMAL LOW (ref 36.0–46.0)
Hemoglobin: 10.9 g/dL — ABNORMAL LOW (ref 12.0–15.0)
Lymphocytes Relative: 30.6 % (ref 12.0–46.0)
Lymphs Abs: 1.5 10*3/uL (ref 0.7–4.0)
MCHC: 33.1 g/dL (ref 30.0–36.0)
MCV: 97 fl (ref 78.0–100.0)
Monocytes Absolute: 0.6 10*3/uL (ref 0.1–1.0)
Monocytes Relative: 11.6 % (ref 3.0–12.0)
Neutro Abs: 2.6 10*3/uL (ref 1.4–7.7)
Neutrophils Relative %: 54.5 % (ref 43.0–77.0)
Platelets: 310 10*3/uL (ref 150.0–400.0)
RBC: 3.4 Mil/uL — ABNORMAL LOW (ref 3.87–5.11)
RDW: 14.1 % (ref 11.5–15.5)
WBC: 4.8 10*3/uL (ref 4.0–10.5)

## 2021-06-13 LAB — AMYLASE: Amylase: 87 U/L (ref 27–131)

## 2021-06-13 LAB — LIPASE: Lipase: 257 U/L — ABNORMAL HIGH (ref 11.0–59.0)

## 2021-06-13 NOTE — Addendum Note (Signed)
Addended by: Mertha Finders on: 06/13/2021 09:22 AM   Modules accepted: Orders

## 2021-06-13 NOTE — Addendum Note (Signed)
Addended by: Mertha Finders on: 06/13/2021 08:53 AM   Modules accepted: Orders

## 2021-06-14 ENCOUNTER — Telehealth: Payer: Self-pay | Admitting: Family

## 2021-06-14 ENCOUNTER — Encounter: Payer: Self-pay | Admitting: Family

## 2021-06-14 NOTE — Telephone Encounter (Signed)
Follow up lab work is improving.  I would like for her to repeat amylase, lipase, LFT, CBC 8/19 please.

## 2021-06-15 ENCOUNTER — Other Ambulatory Visit: Payer: Self-pay

## 2021-06-15 ENCOUNTER — Other Ambulatory Visit: Payer: BC Managed Care – PPO

## 2021-06-15 DIAGNOSIS — K859 Acute pancreatitis without necrosis or infection, unspecified: Secondary | ICD-10-CM

## 2021-06-15 NOTE — Telephone Encounter (Signed)
Patient advised of results and scheduled to be here this afternoon

## 2021-06-18 ENCOUNTER — Other Ambulatory Visit (INDEPENDENT_AMBULATORY_CARE_PROVIDER_SITE_OTHER): Payer: BC Managed Care – PPO

## 2021-06-18 ENCOUNTER — Other Ambulatory Visit: Payer: Self-pay

## 2021-06-18 ENCOUNTER — Telehealth: Payer: Self-pay | Admitting: Family

## 2021-06-18 DIAGNOSIS — K859 Acute pancreatitis without necrosis or infection, unspecified: Secondary | ICD-10-CM | POA: Diagnosis not present

## 2021-06-18 LAB — COMPREHENSIVE METABOLIC PANEL
ALT: 14 U/L (ref 0–35)
AST: 14 U/L (ref 0–37)
Albumin: 3.3 g/dL — ABNORMAL LOW (ref 3.5–5.2)
Alkaline Phosphatase: 55 U/L (ref 39–117)
BUN: 4 mg/dL — ABNORMAL LOW (ref 6–23)
CO2: 27 mEq/L (ref 19–32)
Calcium: 9 mg/dL (ref 8.4–10.5)
Chloride: 106 mEq/L (ref 96–112)
Creatinine, Ser: 0.76 mg/dL (ref 0.40–1.20)
GFR: 101.04 mL/min (ref >60.00)
Glucose, Bld: 74 mg/dL (ref 70–99)
Potassium: 4.5 mEq/L (ref 3.5–5.1)
Sodium: 140 mEq/L (ref 135–145)
Total Bilirubin: 0.3 mg/dL (ref 0.2–1.2)
Total Protein: 6 g/dL (ref 6.0–8.3)

## 2021-06-18 LAB — CBC WITH DIFFERENTIAL/PLATELET
Basophils Absolute: 0.1 10*3/uL (ref 0.0–0.1)
Basophils Relative: 1.1 % (ref 0.0–3.0)
Eosinophils Absolute: 0.1 10*3/uL (ref 0.0–0.7)
Eosinophils Relative: 2.1 % (ref 0.0–5.0)
HCT: 34.9 % — ABNORMAL LOW (ref 36.0–46.0)
Hemoglobin: 11.4 g/dL — ABNORMAL LOW (ref 12.0–15.0)
Lymphocytes Relative: 31.2 % (ref 12.0–46.0)
Lymphs Abs: 1.9 10*3/uL (ref 0.7–4.0)
MCHC: 32.7 g/dL (ref 30.0–36.0)
MCV: 96 fl (ref 78.0–100.0)
Monocytes Absolute: 0.5 10*3/uL (ref 0.1–1.0)
Monocytes Relative: 8.5 % (ref 3.0–12.0)
Neutro Abs: 3.5 10*3/uL (ref 1.4–7.7)
Neutrophils Relative %: 57.1 % (ref 43.0–77.0)
Platelets: 462 10*3/uL — ABNORMAL HIGH (ref 150.0–400.0)
RBC: 3.64 Mil/uL — ABNORMAL LOW (ref 3.87–5.11)
RDW: 13.9 % (ref 11.5–15.5)
WBC: 6.1 10*3/uL (ref 4.0–10.5)

## 2021-06-18 LAB — AMYLASE: Amylase: 56 U/L (ref 27–131)

## 2021-06-18 LAB — LIPASE: Lipase: 127 U/L — ABNORMAL HIGH (ref 11.0–59.0)

## 2021-06-18 NOTE — Telephone Encounter (Signed)
Form placed in provider's folder 

## 2021-06-18 NOTE — Telephone Encounter (Signed)
Pt dropped off document to be filled out by provider [employer] - Paperwork 5 pages- for Anxiety) Pt would like to have paperwork faxed when done to [phone]. Document put at front office tray under providers name.

## 2021-06-19 ENCOUNTER — Telehealth: Payer: Self-pay | Admitting: Family

## 2021-06-19 DIAGNOSIS — Z0279 Encounter for issue of other medical certificate: Secondary | ICD-10-CM

## 2021-06-19 DIAGNOSIS — R6889 Other general symptoms and signs: Secondary | ICD-10-CM

## 2021-06-19 DIAGNOSIS — K852 Alcohol induced acute pancreatitis without necrosis or infection: Secondary | ICD-10-CM

## 2021-06-19 NOTE — Telephone Encounter (Signed)
Reviewed labs with pt. She is tolerating soup without difficulty, no GI symptoms/pain. Advised pt to slowly advance diet as tolerated and return in 1 week for lab work. Notes cold intolerance. Will check TSH.

## 2021-06-22 ENCOUNTER — Encounter: Payer: Self-pay | Admitting: Nurse Practitioner

## 2021-06-22 ENCOUNTER — Ambulatory Visit (INDEPENDENT_AMBULATORY_CARE_PROVIDER_SITE_OTHER): Payer: BC Managed Care – PPO | Admitting: Nurse Practitioner

## 2021-06-22 ENCOUNTER — Other Ambulatory Visit (INDEPENDENT_AMBULATORY_CARE_PROVIDER_SITE_OTHER): Payer: BC Managed Care – PPO

## 2021-06-22 VITALS — BP 118/62 | HR 67 | Ht 64.0 in | Wt 163.0 lb

## 2021-06-22 DIAGNOSIS — F1010 Alcohol abuse, uncomplicated: Secondary | ICD-10-CM | POA: Diagnosis not present

## 2021-06-22 DIAGNOSIS — K2980 Duodenitis without bleeding: Secondary | ICD-10-CM

## 2021-06-22 DIAGNOSIS — K852 Alcohol induced acute pancreatitis without necrosis or infection: Secondary | ICD-10-CM

## 2021-06-22 DIAGNOSIS — K219 Gastro-esophageal reflux disease without esophagitis: Secondary | ICD-10-CM | POA: Diagnosis not present

## 2021-06-22 LAB — TRIGLYCERIDES: Triglycerides: 89 mg/dL (ref 0.0–149.0)

## 2021-06-22 NOTE — Progress Notes (Signed)
ASSESSMENT AND PLAN    # 35 yo female with acute pancreatitis. CTAP suggests groove pancreatitis and wall thickening of gastric antrum extending to the third part of duodenum with adjacent inflammatory stranding.  Pancreatitis most certainly due to to alcohol . No gallstones on [place]. Normal Ca+ level. She wasn't taking medications at the time. No [place] of pancreatic disease.  --Check triglyceride level.  --She has already stopped drinking Etoh ( none since CT scan). She sees no chance for a relapse. I stressed the importance total abstinence.  --For now, continue daily Omeprazole given gastric / duodenal wall thickening on CT scan though findings probably reactive. Rarely takes NSAIDS --Continue to slowly advance diet as tolerated.  --Clinically she continues to improve.  --Follow up with me in 4 weeks. Will see if Dr. Loletha Carrow feels follow up CT scan is necessary and / or an endoscopic evaluation of gastroduodenitis   #Elevated liver enzymes during recent ED visit for pancreatitis.  AST to ALT elevation suggestive of EtOH.  Liver enzymes have returned to normal since discontinuation of alcohol  # GERD.  Increased symptoms off PPI in setting of alcohol abuse.  Now asymptomatic after discontinuation of alcohol and resumption of PPI.  --Continue omeprazole 20 mg daily for now.  We can revisit this when I see her in follow-up  # Mild normocytic anemia.  This has just developed during episode of pancreatitis for which she was seen in the ED.  Hemoglobin down from 12.9-11.4 but I suspect she got fluids in the ED. she still has menstrual cycles.  Prior to recent bout of pancreatitis her hemoglobin was stable in the 12-13 range.  --I think she has an upcoming CBC with her PCP.  If not I can recheck her CBC when I see her in follow-up.  She has not had any gastrointestinal bleeding.  She still has menstrual cycles, heavy at time   HISTORY OF PRESENT ILLNESS     Chief Complaint : Pancreatitis  Joan Thomas is a 35 y.o. female with a past medical history significant for GERD, pancreatitis, alcohol abuse, anxiety, tobacco abuse. See PMH below for any additional history.   Joan Thomas is known to Dr. Loletha Carrow for history of GERD but she hasn't been seen since February 2019.  She is referred now by PCP for evaluation of pancreatitis  06/05/2021 Seen in ED with acute onset of abdominal pain radiating through to her back, nausea and vomiting. CBC normal. Lipase elevated at 137, AST 217, ALT 87. Normal bilirubin and alk phos . RUQ [place] -no cholelithiasis.  CBD 3.3 mm. CTAP w/ contrast showed pancreatitis without associated fluid collections.  Gastritis/duodenitis. She declined hospital admission and was discharged from the ED on omeprazole and zofran with plans for outpatient GI follow up.   She was advised to discontinue alcohol  06/07/21 -  ED for ongoing symptoms .  WBC 10.7 Lipase 87 AST 46, ALT 42.   06/11/21 ED follow-up with PCP.  Following liquid diet.  Clinically improving but lipase was rechecked and was 402.  Liver enzymes have normalized   INTERVAL HISTORY :  Joan Thomas's abdominal pain has resolved.  She is eating solids though limiting fat intake.  She has not had any further nausea and vomiting.  She has not had any alcohol since being diagnosed with pancreatitis.  She has also quit smoking tobacco.  At the time of pancreatitis diagnosis patient was not taking any medications.  She has no family history of  pancreatic diseases. She has had heavy Etoh use since at least 2018. No Etoh since getting pancreatitis earlier this month. She quit smoking at the same time.   Regarding patient's history of GERD.  She was having a lot of reflux associated with heavy alcohol use.  She has been asymptomatic since the discontinuation of alcohol though has been on omeprazole prescribed by the ED.  Data Reviewed: 06/05/21 CTAP w/ contrast  IMPRESSION: 1. Inflammatory stranding along the head of the pancreas,  with the epicenter along the pancreaticoduodenal groove, with some edema of the pancreatic parenchyma in this area. 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 No walled off fluid collection or evidence of pancreatic necrosis. 2. Wall thickening of the gastric antrum extending to the third part of the duodenum with adjacent inflammatory stranding, suggestive of reactive gastritis/duodenitis. 3. Diffuse hepatic steatosis  06/05/21 RUQ [place] No gallbladder wall thickening.  No gallstones.  CBD 3.3 mm.  PREVIOUS GI EVALUATIONS:   September 2018 EGD for dysphagia and chest pain.  --Normal larynx. - Normal esophagus. - Normal stomach. - Normal examined duodenum. - No specimens collected    Past Medical History:  Diagnosis Date   Anxiety    GERD (gastroesophageal reflux disease)    Pancreatitis    Vaginal Pap smear, abnormal      Past Surgical History:  Procedure Laterality Date   ECTOPIC PREGNANCY SURGERY     ESOPHAGEAL MANOMETRY N/A 07/28/2017   Procedure: ESOPHAGEAL MANOMETRY (EM);  Surgeon: Mauri Pole, MD;  Location: [facility];  Service: Endoscopy;  Laterality: N/A;   [place] IMPEDANCE STUDY N/A 07/28/2017   Procedure: [place] IMPEDANCE STUDY;  Surgeon: Mauri Pole, MD;  Location: [facility];  Service: Endoscopy;  Laterality: N/A;   Family History  Problem Relation Age of Onset   Hypertension Mother    Hypertension Sister        thrombocytosis   Arthritis Maternal Grandmother    GER disease Maternal Grandmother    Gout Maternal Grandmother    Osteoporosis Maternal Grandmother    Diabetes Maternal Grandfather    Heart disease Maternal Grandfather    Sleep apnea Maternal Grandfather    Kidney failure Paternal Grandmother    Breast cancer Paternal Grandmother    COPD Paternal Grandmother    Prostate cancer Paternal Grandfather        several types   Breast cancer Maternal Aunt    Prostate  cancer Maternal Uncle    Alcohol abuse Neg Hx    Asthma Neg Hx    Birth defects Neg Hx    Depression Neg Hx    Drug abuse Neg Hx    Early death Neg Hx    Hearing loss Neg Hx    Hyperlipidemia Neg Hx    Kidney disease Neg Hx    Learning disabilities Neg Hx    Mental illness Neg Hx    Mental retardation Neg Hx    Miscarriages / Stillbirths Neg Hx    Stroke Neg Hx    Vision loss Neg Hx    Esophageal cancer Neg Hx    Social History   Tobacco Use   Smoking status: Former    Packs/day: 0.50    Types: Cigarettes   Smokeless tobacco: Never   Tobacco comments:    None since 06/05/21  Vaping Use   Vaping Use: Former   Substances: CBD, Flavoring, Synthetic cannabinoids   Devices: hooka  Substance Use Topics   Alcohol  use: Not Currently    Alcohol/week: 7.0 standard drinks    Types: 2 Glasses of wine, 5 Shots of liquor per week    Comment: daily-none since dx pancreatitis   Drug use: No   Current Outpatient Medications  Medication Sig Dispense Refill   ALPRAZolam (XANAX) 0.25 MG tablet Take 1 tablet (0.25 mg total) by mouth 2 (two) times daily as needed for anxiety. 30 tablet 0   HYDROcodone-acetaminophen (NORCO/VICODIN) 5-325 MG tablet Take 1 tablet by mouth every 4 (four) hours as needed. 12 tablet 0   loratadine (CLARITIN) 10 MG tablet Take 1 tablet (10 mg total) by mouth daily. 90 tablet 4   omeprazole (PRILOSEC) 20 MG capsule Take 1 capsule (20 mg total) by mouth daily. 30 capsule 0   ondansetron (ZOFRAN ODT) 4 MG disintegrating tablet Take 1 tablet (4 mg total) by mouth every 8 (eight) hours as needed for nausea or vomiting. 20 tablet 0   No current facility-administered medications for this visit.   No Known Allergies   Review of Systems: Positive for anxiety, back pain, fatigue, night sweats, sleeping problems, swelling.  All other systems reviewed and negative except where noted in HPI.    PHYSICAL EXAM :    Wt Readings from Last 3 Encounters:  06/22/21 163 lb  (73.9 kg)  06/11/21 165 lb (74.8 kg)  06/07/21 159 lb (72.1 kg)    BP 118/62   Pulse 67   Ht 5' 4" (1.626 m)   Wt 163 lb (73.9 kg)   SpO2 98%   BMI 27.98 kg/m  Constitutional:  Pleasant female in no acute distress. Psychiatric: Normal mood and affect. Behavior is normal. EENT: Pupils normal.  Conjunctivae are normal. No scleral icterus. Neck supple.  Cardiovascular: Normal rate, regular rhythm. No edema Pulmonary/chest: Effort normal and breath sounds normal. No wheezing, rales or rhonchi. Abdominal: Soft, nondistended, nontender. Bowel sounds active throughout. There are no masses palpable. No hepatomegaly. Neurological: Alert and oriented to person place and time. Skin: Skin is warm and dry. No rashes noted.  Tye Savoy, NP  06/22/2021, 2:05 PM  I spent 45 minutes total reviewing records, obtaining history, performing exam, counseling patient and documenting visit / findings.   Cc:  Referring Provider [name], NP

## 2021-06-22 NOTE — Patient Instructions (Addendum)
LABS:  Lab work has been ordered for you today. Our lab is located in the basement. Press "B" on the elevator. The lab is located at the first door on the left as you exit the elevator.  HEALTHCARE LAWS AND MY CHART RESULTS: Due to recent changes in healthcare laws, you may see the results of your imaging and laboratory studies on MyChart before your provider has had a chance to review them.   We understand that in some cases there may be results that are confusing or concerning to you. Not all laboratory results come back in the same time frame and the provider may be waiting for multiple results in order to interpret others.  Please give [place] 48 hours in order for your provider to thoroughly review all the results before contacting the office for clarification of your results.   Continue Omeprazole 20 MG a day. No alcohol.  We will follow up with you regarding scheduling the follow up visit.  It was great seeing you today! Thank you for entrusting me with your care and choosing [facility].  Willette Cluster, NP  The [facility] providers would like to encourage you to use [facility] to communicate with providers for non-urgent requests or questions.  Due to long hold times on the telephone, sending your provider a message by [facility] may be faster and more efficient way to get a response. Please allow 48 business hours for a response.  Please remember that this is for non-urgent requests/questions.  If you are age 43 or older, your body mass index should be between 23-30. Your Body mass index is 27.98 kg/m. If this is out of the aforementioned range listed, please consider follow up with your Primary Care Provider.  If you are age 52 or younger, your body mass index should be between 19-25. Your Body mass index is 27.98 kg/m. If this is out of the aformentioned range listed, please consider follow up with your Primary Care Provider.

## 2021-06-25 NOTE — Progress Notes (Signed)
____________________________________________________________  Attending physician addendum:  Thank you for sending this case to me. I have reviewed the entire note and agree with the plan.  Agree this appears related to excess alcohol intake, and hopefully she is committed to abstinence. The gastric and proximal duodenal inflammatory findings on CT scan appear most likely secondary to the adjacent pancreatitis.  Since she is clinically improved, I do not feel repeat imaging is necessary at this point.  Can certainly be reevaluated depending on her overall clinical picture when she follows up in clinic.  Amada Jupiter, MD  ____________________________________________________________

## 2021-06-26 ENCOUNTER — Other Ambulatory Visit: Payer: Self-pay

## 2021-06-26 ENCOUNTER — Other Ambulatory Visit (INDEPENDENT_AMBULATORY_CARE_PROVIDER_SITE_OTHER): Payer: BC Managed Care – PPO

## 2021-06-26 DIAGNOSIS — R6889 Other general symptoms and signs: Secondary | ICD-10-CM | POA: Diagnosis not present

## 2021-06-26 DIAGNOSIS — K859 Acute pancreatitis without necrosis or infection, unspecified: Secondary | ICD-10-CM | POA: Diagnosis not present

## 2021-06-26 DIAGNOSIS — K852 Alcohol induced acute pancreatitis without necrosis or infection: Secondary | ICD-10-CM | POA: Diagnosis not present

## 2021-06-26 LAB — COMPREHENSIVE METABOLIC PANEL
ALT: 18 U/L (ref 0–35)
AST: 17 U/L (ref 0–37)
Albumin: 3.6 g/dL (ref 3.5–5.2)
Alkaline Phosphatase: 67 U/L (ref 39–117)
BUN: 5 mg/dL — ABNORMAL LOW (ref 6–23)
CO2: 26 mEq/L (ref 19–32)
Calcium: 9.3 mg/dL (ref 8.4–10.5)
Chloride: 104 mEq/L (ref 96–112)
Creatinine, Ser: 0.71 mg/dL (ref 0.40–1.20)
GFR: 109.62 mL/min (ref >60.00)
Glucose, Bld: 92 mg/dL (ref 70–99)
Potassium: 4.3 mEq/L (ref 3.5–5.1)
Sodium: 138 mEq/L (ref 135–145)
Total Bilirubin: 0.3 mg/dL (ref 0.2–1.2)
Total Protein: 6.1 g/dL (ref 6.0–8.3)

## 2021-06-26 LAB — CBC WITH DIFFERENTIAL/PLATELET
Basophils Absolute: 0.1 10*3/uL (ref 0.0–0.1)
Basophils Relative: 1.2 % (ref 0.0–3.0)
Eosinophils Absolute: 0.2 10*3/uL (ref 0.0–0.7)
Eosinophils Relative: 3.2 % (ref 0.0–5.0)
HCT: 37.2 % (ref 36.0–46.0)
Hemoglobin: 12.1 g/dL (ref 12.0–15.0)
Lymphocytes Relative: 31.6 % (ref 12.0–46.0)
Lymphs Abs: 1.7 10*3/uL (ref 0.7–4.0)
MCHC: 32.4 g/dL (ref 30.0–36.0)
MCV: 95.1 fl (ref 78.0–100.0)
Monocytes Absolute: 0.5 10*3/uL (ref 0.1–1.0)
Monocytes Relative: 9.5 % (ref 3.0–12.0)
Neutro Abs: 3 10*3/uL (ref 1.4–7.7)
Neutrophils Relative %: 54.5 % (ref 43.0–77.0)
Platelets: 381 10*3/uL (ref 150.0–400.0)
RBC: 3.91 Mil/uL (ref 3.87–5.11)
RDW: 14 % (ref 11.5–15.5)
WBC: 5.4 10*3/uL (ref 4.0–10.5)

## 2021-06-26 LAB — HEPATIC FUNCTION PANEL
ALT: 18 U/L (ref 0–35)
AST: 17 U/L (ref 0–37)
Albumin: 3.6 g/dL (ref 3.5–5.2)
Alkaline Phosphatase: 67 U/L (ref 39–117)
Bilirubin, Direct: 0.1 mg/dL (ref 0.0–0.3)
Total Bilirubin: 0.3 mg/dL (ref 0.2–1.2)
Total Protein: 6.1 g/dL (ref 6.0–8.3)

## 2021-06-26 LAB — AMYLASE: Amylase: 70 U/L (ref 27–131)

## 2021-06-26 LAB — TSH: TSH: 1.79 u[IU]/mL (ref 0.35–5.50)

## 2021-06-26 LAB — LIPASE: Lipase: 130 U/L — ABNORMAL HIGH (ref 11.0–59.0)

## 2021-06-27 ENCOUNTER — Telehealth: Payer: Self-pay | Admitting: Family

## 2021-06-27 NOTE — Telephone Encounter (Signed)
Lvm for patient to call back

## 2021-06-27 NOTE — Telephone Encounter (Signed)
Lipase is stable but still rising.  Recommend that she call GI to schedule consult. They tried to call her but her voicemail was full. Can you please give her the number for [facility]?

## 2021-06-27 NOTE — Telephone Encounter (Signed)
Patient reports she saw Joan Thomas at GI 8/26 and she will continue to follow up with them.  She was advised of results.

## 2021-06-29 ENCOUNTER — Telehealth: Payer: Self-pay

## 2021-06-29 NOTE — Telephone Encounter (Signed)
Pt dropped off FMLA paperwork for completion from [place].  Pt stated the paperwork needs to be completed by 07/05/21 and sent back to the fax number on the paperwork.  PW placed in provider's box for pick up and completion.

## 2021-07-03 ENCOUNTER — Telehealth: Payer: Self-pay | Admitting: Family

## 2021-07-03 NOTE — Telephone Encounter (Signed)
Medication: omeprazole (PRILOSEC) 20 MG capsule   Has the patient contacted their pharmacy? No. (If no, request that the patient contact the pharmacy for the refill.) (If yes, when and what did the pharmacy advise?)  Preferred Pharmacy (with phone number or street name): [facility]  [address] [place] [address]  Phone:  [phone]    Agent: Please be advised that RX refills may take up to 3 business days. We ask that you follow-up with your pharmacy.

## 2021-07-03 NOTE — Telephone Encounter (Signed)
Form in provider's folder

## 2021-07-04 ENCOUNTER — Other Ambulatory Visit (HOSPITAL_BASED_OUTPATIENT_CLINIC_OR_DEPARTMENT_OTHER): Payer: Self-pay

## 2021-07-04 ENCOUNTER — Other Ambulatory Visit: Payer: Self-pay

## 2021-07-04 MED ORDER — OMEPRAZOLE 20 MG PO CPDR
20.0000 mg | DELAYED_RELEASE_CAPSULE | Freq: Every day | ORAL | 0 refills | Status: DC
  Filled 2021-07-04: qty 90, 90d supply, fill #0

## 2021-07-06 DIAGNOSIS — Z0279 Encounter for issue of other medical certificate: Secondary | ICD-10-CM

## 2021-08-02 ENCOUNTER — Telehealth: Payer: Self-pay | Admitting: Family

## 2021-08-02 NOTE — Telephone Encounter (Signed)
Pharmacy: [facility] on [address]  Patient requesting 3 meds refill  ALPRAZolam (XANAX) 0.25 MG tablet [[id number]]  ondansetron (ZOFRAN ODT) 4 MG disintegrating tablet [[id number]]  loratadine (CLARITIN) 10 MG tablet [[id number]]

## 2021-08-03 ENCOUNTER — Other Ambulatory Visit: Payer: Self-pay

## 2021-08-03 ENCOUNTER — Other Ambulatory Visit (HOSPITAL_BASED_OUTPATIENT_CLINIC_OR_DEPARTMENT_OTHER): Payer: Self-pay

## 2021-08-03 MED ORDER — ALPRAZOLAM 0.25 MG PO TABS
0.2500 mg | ORAL_TABLET | Freq: Two times a day (BID) | ORAL | 0 refills | Status: DC | PRN
  Filled 2021-08-03: qty 30, 15d supply, fill #0

## 2021-08-03 MED ORDER — ONDANSETRON 4 MG PO TBDP: 4.0000 mg | ORAL_TABLET | Freq: Three times a day (TID) | ORAL | 0 refills | Status: DC | PRN

## 2021-08-03 MED ORDER — LORATADINE 10 MG PO TABS: 10.0000 mg | ORAL_TABLET | Freq: Every day | ORAL | 4 refills | Status: AC

## 2021-08-03 NOTE — Telephone Encounter (Signed)
Last RX:06/11/21 Last OV:06/11/21 Next OV:12/12/21 UDS:12/07/20 CSC:12/07/20

## 2021-08-03 NOTE — Telephone Encounter (Signed)
Patient states her medication needs to be sent to [facility] on [facility], [address].  She will pick up Xanax and claritin from [facility], but she also needs her Zofran to be refilled and sent to [facility]. Please advice.

## 2021-08-03 NOTE — Telephone Encounter (Signed)
Prescriptions sent to pharmacies as requested

## 2021-09-04 ENCOUNTER — Telehealth: Payer: Self-pay | Admitting: Family

## 2021-09-04 NOTE — Telephone Encounter (Signed)
Would like medications sent to new pharmacy:  Medication: [id number]  omeprazole (PRILOSEC) 20 MG capsule   Has the patient contacted their pharmacy? No. (If no, request that the patient contact the pharmacy for the refill.) (If yes, when and what did the pharmacy advise?)  Preferred Pharmacy (with phone number or street name):  [facility] [address] [place], [place] [address]  [phone]  Agent: Please be advised that RX refills may take up to 3 business days. We ask that you follow-up with your pharmacy.

## 2021-09-05 ENCOUNTER — Other Ambulatory Visit: Payer: Self-pay | Admitting: Family

## 2021-09-05 ENCOUNTER — Other Ambulatory Visit (HOSPITAL_BASED_OUTPATIENT_CLINIC_OR_DEPARTMENT_OTHER): Payer: Self-pay

## 2021-09-05 ENCOUNTER — Telehealth: Payer: Self-pay | Admitting: Family

## 2021-09-05 ENCOUNTER — Other Ambulatory Visit: Payer: Self-pay

## 2021-09-05 MED ORDER — OMEPRAZOLE 20 MG PO CPDR: 20.0000 mg | DELAYED_RELEASE_CAPSULE | Freq: Every day | ORAL | 0 refills | Status: DC

## 2021-09-05 NOTE — Telephone Encounter (Signed)
Rx sent in at pharmacy requested, patient advised

## 2021-09-05 NOTE — Telephone Encounter (Signed)
Medication:  [id number]  ALPRAZolam Prudy Feeler) 0.25 MG tablet     Has the patient contacted their pharmacy? No. (If no, request that the patient contact the pharmacy for the refill.) (If yes, when and what did the pharmacy advise?)     Preferred Pharmacy (with phone number or street name):  [id number] - [place], [place] - [address]  [address], [place] [address]  Phone:  [phone]  Fax:  [phone]     Agent: Please be advised that RX refills may take up to 3 business days. We ask that you follow-up with your pharmacy.

## 2021-09-06 ENCOUNTER — Other Ambulatory Visit (HOSPITAL_BASED_OUTPATIENT_CLINIC_OR_DEPARTMENT_OTHER): Payer: Self-pay

## 2021-09-06 MED ORDER — ALPRAZOLAM 0.25 MG PO TABS
0.2500 mg | ORAL_TABLET | Freq: Two times a day (BID) | ORAL | 0 refills | Status: DC | PRN
  Filled 2021-09-06: qty 30, 15d supply, fill #0

## 2021-09-06 NOTE — Telephone Encounter (Signed)
Requesting: Xanax 0.25mg  Contract: 03/11/2021 UDS: 11/2020 Last Visit: 06/11/2021 Next Visit: 12/12/2021 Last Refill: 08/03/2021, #30 x 0RF  Please Advise

## 2021-09-07 NOTE — Telephone Encounter (Signed)
Rx was sent yesterday.

## 2021-09-28 ENCOUNTER — Other Ambulatory Visit (HOSPITAL_BASED_OUTPATIENT_CLINIC_OR_DEPARTMENT_OTHER): Payer: Self-pay

## 2021-09-28 ENCOUNTER — Telehealth: Payer: BC Managed Care – PPO | Admitting: Nurse Practitioner

## 2021-09-28 DIAGNOSIS — J40 Bronchitis, not specified as acute or chronic: Secondary | ICD-10-CM | POA: Diagnosis not present

## 2021-09-28 MED ORDER — AZITHROMYCIN 250 MG PO TABS
ORAL_TABLET | ORAL | 0 refills | Status: DC
  Filled 2021-09-28: qty 6, 5d supply, fill #0

## 2021-09-28 MED ORDER — BENZONATATE 100 MG PO CAPS
100.0000 mg | ORAL_CAPSULE | Freq: Three times a day (TID) | ORAL | 0 refills | Status: DC | PRN
  Filled 2021-09-28: qty 20, 7d supply, fill #0

## 2021-09-28 NOTE — Patient Instructions (Signed)

## 2021-09-28 NOTE — Progress Notes (Signed)
Virtual Visit Consent   Joan Joan, you are scheduled for a virtual visit with Mary-Margaret Daphine Deutscher, FNP, a [facility] provider, today.     Just as with appointments in the office, your consent must be obtained to participate.  Your consent will be active for this visit and any virtual visit you may have with one of our providers in the next 365 days.     If you have a MyChart account, a copy of this consent can be sent to you electronically.  All virtual visits are billed to your insurance company just like a traditional visit in the office.    As this is a virtual visit, video technology does not allow for your provider to perform a traditional examination.  This may limit your provider's ability to fully assess your condition.  If your provider identifies any concerns that need to be evaluated in person or the need to arrange testing (such as labs, EKG, etc.), we will make arrangements to do so.     Although advances in technology are sophisticated, we cannot ensure that it will always work on either your end or our end.  If the connection with a video visit is poor, the visit may have to be switched to a telephone visit.  With either a video or telephone visit, we are not always able to ensure that we have a secure connection.     I need to obtain your verbal consent now.   Are you willing to proceed with your visit today? YES   Lateasha Breuer has provided verbal consent on 09/28/2021 for a virtual visit (video or telephone).   Mary-Margaret Daphine Deutscher, FNP   Date: 09/28/2021 4:09 PM   Virtual Visit via Video Note   I, Mary-Margaret Daphine Deutscher, connected with Joan Joan ([id number], 02-07-1986) on 09/28/21 at  4:15 PM EST by a video-enabled telemedicine application and verified that I am speaking with the correct person using two identifiers.  Location: Patient: Virtual Visit Location Patient: Home Provider: Virtual Visit Location Provider: Mobile   I  discussed the limitations of evaluation and management by telemedicine and the availability of in person appointments. The patient expressed understanding and agreed to proceed.    History of Present Illness: Joan Joan is a 35 y.o. who identifies as a female who was assigned female at birth, and is being seen today for uri.  HPI: Patient states that she has had cough and congestion since Sunday. She has doneseveral covid test and they were negative. She is cough so much that her chest is hurting. She has been taking robitussin which has not helped. Cough has gone from dry  to productive.   Review of Systems  Constitutional:  Positive for chills and fever. Negative for malaise/fatigue.  HENT:  Positive for congestion. Negative for sore throat.   Respiratory:  Positive for cough and sputum production.   Musculoskeletal:  Negative for myalgias.  Neurological:  Positive for headaches. Negative for dizziness.   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 07/20/2019   Dysphagia    Gastroesophageal reflux disease     Allergies: No Known Allergies Medications:  Current Outpatient Medications:    ALPRAZolam (XANAX) 0.25 MG tablet, Take 1 tablet (0.25 mg total) by mouth 2 (two) times daily as needed for anxiety., Disp: 30 tablet, Rfl: 0   HYDROcodone-acetaminophen (NORCO/VICODIN) 5-325 MG  tablet, Take 1 tablet by mouth every 4 (four) hours as needed., Disp: 12 tablet, Rfl: 0   loratadine (CLARITIN) 10 MG tablet, Take 1 tablet (10 mg total) by mouth daily., Disp: 90 tablet, Rfl: 4   omeprazole (PRILOSEC) 20 MG capsule, Take 1 capsule (20 mg total) by mouth daily., Disp: 90 capsule, Rfl: 0   ondansetron (ZOFRAN ODT) 4 MG disintegrating tablet, Take 1 tablet (4 mg total) by mouth every 8 (eight) hours as needed for nausea or vomiting., Disp: 20 tablet, Rfl:  0  Observations/Objective: Patient is well-developed, well-nourished in no acute distress.  Resting comfortably  at home.  Head is normocephalic, atraumatic.  No labored breathing.  Speech is clear and coherent with logical content.  Patient is alert and oriented at baseline.  Voice hoarse Deep cough oted  Assessment and Plan:  Marieelena Bartko in today with chief complaint of URI   1. Bronchitis 1. Take meds as prescribed 2. Use a cool mist humidifier especially during the winter months and when heat has been humid. 3. Use saline nose sprays frequently 4. Saline irrigations of the nose can be very helpful if done frequently.  * 4X daily for 1 week*  * Use of a nettie pot can be helpful with this. Follow directions with this* 5. Drink plenty of fluids 6. Keep thermostat turn down low 7.For any cough or congestion- tessalon perles 8. For fever or aces or pains- take tylenol or ibuprofen appropriate for age and weight.  * for fevers greater than 101 orally you may alternate ibuprofen and tylenol every  3 hours.   Meds ordered this encounter  Medications   azithromycin (ZITHROMAX Z-PAK) 250 MG tablet    Sig: As directed    Dispense:  6 tablet    Refill:  0    Order Specific Question:   Supervising Provider    Answer:   Hyacinth Meeker, BRIAN [[id number]]   benzonatate (TESSALON PERLES) 100 MG capsule    Sig: Take 1 capsule (100 mg total) by mouth 3 (three) times daily as needed for cough.    Dispense:  20 capsule    Refill:  0    Order Specific Question:   Supervising Provider    Answer:   Eber Hong [[id number]]       Follow Up Instructions: I discussed the assessment and treatment plan with the patient. The patient was provided an opportunity to ask questions and all were answered. The patient agreed with the plan and demonstrated an understanding of the instructions.  A copy of instructions were sent to the patient via MyChart.  The patient was advised to call back or seek  an in-person evaluation if the symptoms worsen or if the condition fails to improve as anticipated.  Time:  I spent 10 minutes with the patient via telehealth technology discussing the above problems/concerns.    Mary-Margaret Daphine Deutscher, FNP

## 2021-09-29 ENCOUNTER — Other Ambulatory Visit: Payer: Self-pay | Admitting: Family Medicine

## 2021-09-29 DIAGNOSIS — J40 Bronchitis, not specified as acute or chronic: Secondary | ICD-10-CM

## 2021-09-29 MED ORDER — BENZONATATE 100 MG PO CAPS: 100.0000 mg | ORAL_CAPSULE | Freq: Three times a day (TID) | ORAL | 0 refills | Status: DC | PRN

## 2021-09-29 MED ORDER — AZITHROMYCIN 250 MG PO TABS
ORAL_TABLET | ORAL | 0 refills | Status: DC
Start: 2021-09-29 — End: 2022-02-13

## 2021-10-01 ENCOUNTER — Other Ambulatory Visit (HOSPITAL_BASED_OUTPATIENT_CLINIC_OR_DEPARTMENT_OTHER): Payer: Self-pay

## 2021-11-07 ENCOUNTER — Other Ambulatory Visit: Payer: Self-pay | Admitting: Family

## 2021-11-07 ENCOUNTER — Other Ambulatory Visit (HOSPITAL_BASED_OUTPATIENT_CLINIC_OR_DEPARTMENT_OTHER): Payer: Self-pay

## 2021-11-07 MED ORDER — OMEPRAZOLE 20 MG PO CPDR
20.0000 mg | DELAYED_RELEASE_CAPSULE | Freq: Every day | ORAL | 0 refills | Status: DC
  Filled 2021-11-07: qty 90, 90d supply, fill #0

## 2021-11-07 MED ORDER — ALPRAZOLAM 0.25 MG PO TABS
0.2500 mg | ORAL_TABLET | Freq: Two times a day (BID) | ORAL | 0 refills | Status: DC | PRN
  Filled 2021-11-07: qty 30, 15d supply, fill #0

## 2021-11-10 ENCOUNTER — Telehealth: Payer: BC Managed Care – PPO | Admitting: Nurse Practitioner

## 2021-11-10 DIAGNOSIS — M940 Chondrocostal junction syndrome [Tietze]: Secondary | ICD-10-CM | POA: Diagnosis not present

## 2021-11-10 MED ORDER — NAPROXEN 500 MG PO TABS: 500.0000 mg | ORAL_TABLET | Freq: Two times a day (BID) | ORAL | 1 refills | Status: DC

## 2021-11-10 NOTE — Patient Instructions (Signed)
Costochondritis Costochondritis is irritation and swelling (inflammation) of the tissue that connects the ribs to the breastbone (sternum). This tissue is called cartilage. Costochondritis causes pain in the front of the chest. Usually, the pain: Starts slowly. Is in more than one rib. What are the causes? The exact cause of this condition is not always known. It results from stress on the tissue in the affected area. The cause of this stress could be: Chest injury. Exercise or activity, such as lifting. Very bad coughing. What increases the risk? You are more likely to develop this condition if you: Are female. Are 30-40 years old. Recently started a new exercise or work activity. Have low levels of vitamin D. Have a condition that makes you cough often. What are the signs or symptoms? The main symptom of this condition is chest pain. The pain: Usually starts slowly and can be sharp or dull. Gets worse with deep breathing, coughing, or exercise. Gets better with rest. May be worse when you press on the affected area of your ribs and breastbone. How is this treated? This condition usually goes away on its own over time. Your doctor may prescribe an NSAID, such as ibuprofen. This can help reduce pain and inflammation. Treatment may also include: Resting and avoiding activities that make pain worse. Putting heat or ice on the painful area. Doing exercises to stretch your chest muscles. If these treatments do not help, your doctor may inject a numbing medicine to help relieve the pain. Follow these instructions at home: Managing pain, stiffness, and swelling   If told, put ice on the painful area. To do this: Put ice in a plastic bag. Place a towel between your skin and the bag. Leave the ice on for 20 minutes, 2-3 times a day. If told, put heat on the affected area. Do this as often as told by your doctor. Use the heat source that your doctor recommends, such as a moist heat pack or  a heating pad. Place a towel between your skin and the heat source. Leave the heat on for 20-30 minutes. Take off the heat if your skin turns bright red. This is very important if you cannot feel pain, heat, or cold. You may have a greater risk of getting burned. Activity Rest as told by your doctor. Do not do anything that makes your pain worse. This includes any activities that use chest, belly (abdomen), and side muscles. Do not lift anything that is heavier than 10 lb (4.5 kg), or the limit that you are told, until your doctor says that it is safe. Return to your normal activities as told by your doctor. Ask your doctor what activities are safe for you. General instructions Take over-the-counter and prescription medicines only as told by your doctor. Keep all follow-up visits as told by your doctor. This is important. Contact a doctor if: You have chills or a fever. Your pain does not go away or it gets worse. You have a cough that does not go away. Get help right away if: You are short of breath. You have very bad chest pain that is not helped by medicines, heat, or ice. These symptoms may be an emergency. Do not wait to see if the symptoms will go away. Get medical help right away. Call your local emergency services (911 in the [place] Do not drive yourself to the hospital. Summary Costochondritis is irritation and swelling (inflammation) of the tissue that connects the ribs to the breastbone (sternum). This condition   causes pain in the front of the chest. Treatment may include medicines, rest, heat or ice, and exercises. This information is not intended to replace advice given to you by your health care provider. Make sure you discuss any questions you have with your health care provider. Document Revised: 08/27/2019 Document Reviewed: 08/27/2019 Elsevier Patient Education  2022 Elsevier Inc.  

## 2021-11-10 NOTE — Progress Notes (Signed)
Virtual Visit Consent   Joan Thomas, you are scheduled for a virtual visit with Mary-Margaret Daphine Deutscher, FNP, a [facility] provider, today.     Just as with appointments in the office, your consent must be obtained to participate.  Your consent will be active for this visit and any virtual visit you may have with one of our providers in the next 365 days.     If you have a MyChart account, a copy of this consent can be sent to you electronically.  All virtual visits are billed to your insurance company just like a traditional visit in the office.    As this is a virtual visit, video technology does not allow for your provider to perform a traditional examination.  This may limit your provider's ability to fully assess your condition.  If your provider identifies any concerns that need to be evaluated in person or the need to arrange testing (such as labs, EKG, etc.), we will make arrangements to do so.     Although advances in technology are sophisticated, we cannot ensure that it will always work on either your end or our end.  If the connection with a video visit is poor, the visit may have to be switched to a telephone visit.  With either a video or telephone visit, we are not always able to ensure that we have a secure connection.     I need to obtain your verbal consent now.   Are you willing to proceed with your visit today? YES   Joan Thomas has provided verbal consent on 11/10/2021 for a virtual visit (video or telephone).   Mary-Margaret Daphine Deutscher, FNP   Date: 11/10/2021 1:35 PM   Virtual Visit via Video Note   I, Mary-Margaret Daphine Deutscher, connected with Joan Thomas ([id number], 03/08/1986) on 11/10/21 at  1:45 PM EST by a video-enabled telemedicine application and verified that I am speaking with the correct person using two identifiers.  Location: Patient: Virtual Visit Location Patient: Home Provider: Virtual Visit Location Provider: Mobile   I  discussed the limitations of evaluation and management by telemedicine and the availability of in person appointments. The patient expressed understanding and agreed to proceed.    History of Present Illness: Joan Thomas is a 36 y.o. who identifies as a female who was assigned female at birth, and is being seen today for chest pain.  HPI: Chest Pain  This is a new problem. The current episode started in the past 7 days. The onset quality is gradual. The problem occurs intermittently. The problem has been unchanged. The pain is present in the substernal region. The pain is at a severity of 3/10. The pain is mild. The quality of the pain is described as dull (soreness). The pain does not radiate. Pertinent negatives include no abdominal pain, cough, dizziness, orthopnea or palpitations. The pain is aggravated by nothing. She has tried acetaminophen for the symptoms. The treatment provided mild relief. There are no known risk factors.   Review of Systems  Respiratory:  Negative for cough.   Cardiovascular:  Positive for chest pain. Negative for palpitations and orthopnea.  Gastrointestinal:  Negative for abdominal pain.  Neurological:  Negative for dizziness.   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 07/20/2019   Dysphagia    Gastroesophageal reflux disease     Allergies: No Known Allergies  Medications:  Current Outpatient Medications:    ALPRAZolam (XANAX) 0.25 MG tablet, Take 1 tablet (0.25 mg total) by mouth 2 (two) times daily as needed for anxiety., Disp: 30 tablet, Rfl: 0   azithromycin (ZITHROMAX Z-PAK) 250 MG tablet, Take 2 tablets by mouth today then take 1 tablet by mouth everyday for 4 days, Disp: 6 tablet, Rfl: 0   benzonatate (TESSALON PERLES) 100 MG capsule, Take 1 capsule (100 mg total) by mouth 3 (three) times daily as needed for cough., Disp: 20  capsule, Rfl: 0   HYDROcodone-acetaminophen (NORCO/VICODIN) 5-325 MG tablet, Take 1 tablet by mouth every 4 (four) hours as needed., Disp: 12 tablet, Rfl: 0   loratadine (CLARITIN) 10 MG tablet, Take 1 tablet (10 mg total) by mouth daily., Disp: 90 tablet, Rfl: 4   omeprazole (PRILOSEC) 20 MG capsule, Take 1 capsule (20 mg total) by mouth daily., Disp: 90 capsule, Rfl: 0   ondansetron (ZOFRAN ODT) 4 MG disintegrating tablet, Take 1 tablet (4 mg total) by mouth every 8 (eight) hours as needed for nausea or vomiting., Disp: 20 tablet, Rfl: 0  Observations/Objective: Patient is well-developed, well-nourished in no acute distress.  Resting comfortably  at home.  Head is normocephalic, atraumatic.  No labored breathing.  Speech is clear and coherent with logical content.  Patient is alert and oriented at baseline.  Apin on pressing in sunstrenal chest wall  Assessment and Plan:  Joan Thomas in today with chief complaint of Chest Pain   1. Costochondritis Moist heat Rest  No heavy lifting If develop SOB go to the ED  Meds ordered this encounter  Medications   naproxen (NAPROSYN) 500 MG tablet    Sig: Take 1 tablet (500 mg total) by mouth 2 (two) times daily with a meal.    Dispense:  60 tablet    Refill:  1    Order Specific Question:   Supervising Provider    Answer:   Eber Hong [[id number]]        Follow Up Instructions: I discussed the assessment and treatment plan with the patient. The patient was provided an opportunity to ask questions and all were answered. The patient agreed with the plan and demonstrated an understanding of the instructions.  A copy of instructions were sent to the patient via MyChart.  The patient was advised to call back or seek an in-person evaluation if the symptoms worsen or if the condition fails to improve as anticipated.  Time:  I spent 8 minutes with the patient via telehealth technology discussing the above problems/concerns.     Mary-Margaret Daphine Deutscher, FNP

## 2021-11-12 ENCOUNTER — Other Ambulatory Visit (HOSPITAL_BASED_OUTPATIENT_CLINIC_OR_DEPARTMENT_OTHER): Payer: Self-pay

## 2021-11-12 MED ORDER — NAPROXEN 500 MG PO TABS
500.0000 mg | ORAL_TABLET | Freq: Two times a day (BID) | ORAL | 0 refills | Status: DC
  Filled 2021-11-12 – 2022-07-19 (×3): qty 60, 30d supply, fill #0

## 2021-11-12 NOTE — Addendum Note (Signed)
Addended by: Margaretann Loveless on: 11/12/2021 12:50 PM   Modules accepted: Orders

## 2021-11-19 ENCOUNTER — Other Ambulatory Visit (HOSPITAL_BASED_OUTPATIENT_CLINIC_OR_DEPARTMENT_OTHER): Payer: Self-pay

## 2021-12-01 ENCOUNTER — Other Ambulatory Visit: Payer: Self-pay | Admitting: Family

## 2021-12-12 ENCOUNTER — Ambulatory Visit: Payer: BC Managed Care – PPO | Admitting: Family

## 2021-12-12 ENCOUNTER — Telehealth: Payer: Self-pay | Admitting: *Deleted

## 2021-12-12 NOTE — Telephone Encounter (Signed)
Who Is Calling Patient / Member / Family / Caregiver Caller Name Joey Hudock Caller Phone Number [phone] Patient Name Joan Thomas Patient DOB 1986-03-30 Call Type Message Only Information Provided Reason for Call Request to Cancel Office Appointment Initial Comment Caller states they have 8.05am appt Patient request to speak to RN No Disp. Time Disposition Final User 12/12/2021 7:50:55 AM General Information Provided Yes Lois Huxley

## 2021-12-12 NOTE — Telephone Encounter (Signed)
Called but no answer and not able to leave voice mail. SNS notification sent.

## 2021-12-12 NOTE — Telephone Encounter (Signed)
Looks like pt had appt for this morning and may need to be rescheduled.

## 2021-12-12 NOTE — Telephone Encounter (Signed)
Pt has called back and I have scheduled her for an appointment 12/24/21 at 7am.

## 2021-12-24 ENCOUNTER — Encounter: Payer: BC Managed Care – PPO | Admitting: Family

## 2021-12-24 ENCOUNTER — Telehealth: Payer: Self-pay | Admitting: *Deleted

## 2021-12-24 NOTE — Telephone Encounter (Signed)
Who Is Calling Patient / Member / Family / Caregiver Caller Name Erie Sica Caller Phone Number [phone] Patient Name Joan Thomas Patient DOB 03-Sep-1986 Call Type Message Only Information Provided Reason for Call Request to Reschedule Office Appointment Initial Comment Caller states needs to reschedule today's 7 am appt d/t family emergency. Please call to reschedule;

## 2021-12-24 NOTE — Progress Notes (Incomplete)
Subjective:   By signing my name below, I, Joan Thomas, attest that this documentation has been prepared under the direction and in the presence of [name], NP 12/24/2021      Patient ID: Joan Thomas, female    DOB: 05-Nov-1985, 36 y.o.   MRN: [id number]  No chief complaint on file.   HPI Patient is in today for a comprehensive physical exam.  Immunizations- Pap Smear- Mammogram- Social History- Dental and Vision care-  She denies having any unexpected weight change, ear pain, hearing loss and rhinorrhea, visual disturbance, cough, chest pain and leg swelling, nausea, vomiting, diarrhea and blood in stool, or dysuria and frequency, for myalgias and arthralgias, rash, headaches, adenopathy, depression or anxiety at this time   Past Medical History:  Diagnosis Date   Anxiety    GERD (gastroesophageal reflux disease)    Pancreatitis    Vaginal Pap smear, abnormal     Past Surgical History:  Procedure Laterality Date   ECTOPIC PREGNANCY SURGERY     ESOPHAGEAL MANOMETRY N/A 07/28/2017   Procedure: ESOPHAGEAL MANOMETRY (EM);  Surgeon: Mauri Pole, MD;  Location: [facility];  Service: Endoscopy;  Laterality: N/A;   [place] IMPEDANCE STUDY N/A 07/28/2017   Procedure: [place] IMPEDANCE STUDY;  Surgeon: Mauri Pole, MD;  Location: [facility];  Service: Endoscopy;  Laterality: N/A;    Family History  Problem Relation Age of Onset   Hypertension Mother    Hypertension Sister        thrombocytosis   Arthritis Maternal Grandmother    GER disease Maternal Grandmother    Gout Maternal Grandmother    Osteoporosis Maternal Grandmother    Diabetes Maternal Grandfather    Heart disease Maternal Grandfather    Sleep apnea Maternal Grandfather    Kidney failure Paternal Grandmother    Breast cancer Paternal Grandmother    COPD Paternal Grandmother    Prostate cancer Paternal Grandfather        several types   Breast cancer Maternal Aunt    Prostate  cancer Maternal Uncle    Alcohol abuse Neg Hx    Asthma Neg Hx    Birth defects Neg Hx    Depression Neg Hx    Drug abuse Neg Hx    Early death Neg Hx    Hearing loss Neg Hx    Hyperlipidemia Neg Hx    Kidney disease Neg Hx    Learning disabilities Neg Hx    Mental illness Neg Hx    Mental retardation Neg Hx    Miscarriages / Stillbirths Neg Hx    Stroke Neg Hx    Vision loss Neg Hx    Esophageal cancer Neg Hx     Social History   Socioeconomic History   Marital status: Married    Spouse name: Not on file   Number of children: 0   Years of education: Not on file   Highest education level: Not on file  Occupational History   Not on file  Tobacco Use   Smoking status: Former    Packs/day: 0.50    Types: Cigarettes   Smokeless tobacco: Never   Tobacco comments:    None since 06/05/21  Vaping Use   Vaping Use: Former   Substances: CBD, Flavoring, Synthetic cannabinoids   Devices: hooka  Substance and Sexual Activity   Alcohol use: Not Currently    Alcohol/week: 7.0 standard drinks    Types: 2 Glasses of wine, 5 Shots of liquor per week  Comment: daily-none since dx pancreatitis   Drug use: No   Sexual activity: Yes    Partners: Male    Birth control/protection: None  Other Topics Concern   Not on file  Social History Narrative   Worked for [employer] 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     Left her recent job   Married to [place]   No children    Has a dog   Family is out of town.    Enjoys cooking/reading.    Social Determinants of Health   Financial Resource Strain: Not on file  Food Insecurity: Not on file  Transportation Needs: Not on file  Physical Activity: Not on file  Stress: Not on file  Social Connections: Not on file  Intimate Partner Violence: Not on file    Outpatient Medications Prior to Visit  Medication Sig Dispense Refill   ALPRAZolam (XANAX) 0.25 MG tablet Take 1 tablet (0.25 mg total) by mouth 2 (two)  times daily as needed for anxiety. 30 tablet 0   azithromycin (ZITHROMAX Z-PAK) 250 MG tablet Take 2 tablets by mouth today then take 1 tablet by mouth everyday for 4 days 6 tablet 0   benzonatate (TESSALON PERLES) 100 MG capsule Take 1 capsule (100 mg total) by mouth 3 (three) times daily as needed for cough. 20 capsule 0   HYDROcodone-acetaminophen (NORCO/VICODIN) 5-325 MG tablet Take 1 tablet by mouth every 4 (four) hours as needed. 12 tablet 0   loratadine (CLARITIN) 10 MG tablet Take 1 tablet (10 mg total) by mouth daily. 90 tablet 4   naproxen (NAPROSYN) 500 MG tablet Take 1 tablet (500 mg total) by mouth 2 (two) times daily with a meal. 60 tablet 0   omeprazole (PRILOSEC) 20 MG capsule Take 1 capsule (20 mg total) by mouth daily. 90 capsule 0   ondansetron (ZOFRAN ODT) 4 MG disintegrating tablet Take 1 tablet (4 mg total) by mouth every 8 (eight) hours as needed for nausea or vomiting. 20 tablet 0   No facility-administered medications prior to visit.    No Known Allergies  Review of Systems  Constitutional:  Negative for fever.  HENT:  Negative for congestion, ear pain, hearing loss, sinus pain and sore throat.        (-)nystagmus (-)adenopathy  Eyes:  Negative for blurred vision and pain.  Respiratory:  Negative for cough, sputum production, shortness of breath and wheezing.   Cardiovascular:  Negative for chest pain, palpitations and leg swelling.  Gastrointestinal:  Negative for blood in stool, constipation, diarrhea, nausea and vomiting.  Genitourinary:  Negative for dysuria, frequency, hematuria and urgency.  Musculoskeletal:  Negative for back pain, falls, joint pain and myalgias.  Skin:  Negative for rash.  Neurological:  Negative for dizziness, sensory change, loss of consciousness, weakness and headaches.  Endo/Heme/Allergies:  Negative for environmental allergies. Does not bruise/bleed easily.  Psychiatric/Behavioral:  Negative for depression and suicidal ideas. The  patient is not nervous/anxious and does not have insomnia.       Objective:    Physical Exam Constitutional:      General: She is not in acute distress.    Appearance: Normal appearance. She is not ill-appearing.  HENT:     Head: Normocephalic and atraumatic.     Right Ear: External ear normal.     Left Ear: External ear normal.  Eyes:     Extraocular Movements: Extraocular movements intact.     Pupils: Pupils are equal, round, and  reactive to light.  Cardiovascular:     Rate and Rhythm: Normal rate and regular rhythm.     Pulses: Normal pulses.     Heart sounds: Normal heart sounds. No murmur heard. Pulmonary:     Effort: Pulmonary effort is normal. No respiratory distress.     Breath sounds: Normal breath sounds. No wheezing or rhonchi.  Abdominal:     General: Bowel sounds are normal. There is no distension.     Palpations: Abdomen is soft.     Tenderness: There is no abdominal tenderness. There is no guarding or rebound.  Musculoskeletal:     Cervical back: Neck supple.  Lymphadenopathy:     Cervical: No cervical adenopathy.  Skin:    General: Skin is warm and dry.  Neurological:     Mental Status: She is alert and oriented to person, place, and time.  Psychiatric:        Behavior: Behavior normal.        Judgment: Judgment normal.    There were no vitals taken for this visit. Wt Readings from Last 3 Encounters:  06/22/21 163 lb (73.9 kg)  06/11/21 165 lb (74.8 kg)  06/07/21 159 lb (72.1 kg)    Diabetic Foot Exam - Simple   No data filed    Lab Results  Component Value Date   WBC 5.4 06/26/2021   HGB 12.1 06/26/2021   HCT 37.2 06/26/2021   PLT 381.0 06/26/2021   GLUCOSE 92 06/26/2021   CHOL 205 (H) 07/13/2019   TRIG 89.0 06/22/2021   HDL 102.60 07/13/2019   LDLCALC 82 07/13/2019   ALT 18 06/26/2021   ALT 18 06/26/2021   AST 17 06/26/2021   AST 17 06/26/2021   NA 138 06/26/2021   K 4.3 06/26/2021   CL 104 06/26/2021   CREATININE 0.71 06/26/2021    BUN 5 (L) 06/26/2021   CO2 26 06/26/2021   TSH 1.79 06/26/2021    Lab Results  Component Value Date   TSH 1.79 06/26/2021   Lab Results  Component Value Date   WBC 5.4 06/26/2021   HGB 12.1 06/26/2021   HCT 37.2 06/26/2021   MCV 95.1 06/26/2021   PLT 381.0 06/26/2021   Lab Results  Component Value Date   NA 138 06/26/2021   K 4.3 06/26/2021   CO2 26 06/26/2021   GLUCOSE 92 06/26/2021   BUN 5 (L) 06/26/2021   CREATININE 0.71 06/26/2021   BILITOT 0.3 06/26/2021   BILITOT 0.3 06/26/2021   ALKPHOS 67 06/26/2021   ALKPHOS 67 06/26/2021   AST 17 06/26/2021   AST 17 06/26/2021   ALT 18 06/26/2021   ALT 18 06/26/2021   PROT 6.1 06/26/2021   PROT 6.1 06/26/2021   ALBUMIN 3.6 06/26/2021   ALBUMIN 3.6 06/26/2021   CALCIUM 9.3 06/26/2021   ANIONGAP 11 06/07/2021   GFR 109.62 06/26/2021   Lab Results  Component Value Date   CHOL 205 (H) 07/13/2019   Lab Results  Component Value Date   HDL 102.60 07/13/2019   Lab Results  Component Value Date   LDLCALC 82 07/13/2019   Lab Results  Component Value Date   TRIG 89.0 06/22/2021   Lab Results  Component Value Date   CHOLHDL 2 07/13/2019   No results found for: HGBA1C     Assessment & Plan:   Problem List Items Addressed This Visit   None   No orders of the defined types were placed in this encounter.   I,Joan  Thomas,acting as a Education administrator for [employer], NP.,have documented all relevant documentation on the behalf of Nance Pear, NP,as directed by  Nance Pear, NP while in the presence of Nance Pear, NP.   I, [name], NP, personally preformed the services described in this documentation.  All medical record entries made by the scribe were at my direction and in my presence.  I have reviewed the chart and discharge instructions (if applicable) and agree that the record reflects my personal performance and is accurate and complete. 12/24/2021

## 2021-12-24 NOTE — Telephone Encounter (Signed)
Patient had appt on 12/12/21 and called after hours and did not make that appt.  She rescheduled for this morning and called again.  Not sure if you want to make another early appointment for her.

## 2021-12-24 NOTE — Telephone Encounter (Signed)
Patient scheduled to come in 3-07

## 2022-01-01 ENCOUNTER — Encounter: Payer: BC Managed Care – PPO | Admitting: Family

## 2022-01-01 ENCOUNTER — Encounter: Payer: Self-pay | Admitting: Family

## 2022-01-10 DIAGNOSIS — K852 Alcohol induced acute pancreatitis without necrosis or infection: Secondary | ICD-10-CM | POA: Diagnosis not present

## 2022-01-10 DIAGNOSIS — R748 Abnormal levels of other serum enzymes: Secondary | ICD-10-CM | POA: Diagnosis not present

## 2022-01-10 DIAGNOSIS — R112 Nausea with vomiting, unspecified: Secondary | ICD-10-CM | POA: Diagnosis not present

## 2022-01-10 DIAGNOSIS — R1012 Left upper quadrant pain: Secondary | ICD-10-CM | POA: Diagnosis not present

## 2022-01-17 DIAGNOSIS — K852 Alcohol induced acute pancreatitis without necrosis or infection: Secondary | ICD-10-CM | POA: Diagnosis not present

## 2022-02-04 ENCOUNTER — Ambulatory Visit: Payer: BC Managed Care – PPO | Admitting: Nurse Practitioner

## 2022-02-13 ENCOUNTER — Encounter: Payer: Self-pay | Admitting: Family

## 2022-02-13 ENCOUNTER — Other Ambulatory Visit (HOSPITAL_BASED_OUTPATIENT_CLINIC_OR_DEPARTMENT_OTHER): Payer: Self-pay

## 2022-02-13 ENCOUNTER — Ambulatory Visit (INDEPENDENT_AMBULATORY_CARE_PROVIDER_SITE_OTHER): Payer: BC Managed Care – PPO | Admitting: Family

## 2022-02-13 VITALS — BP 112/80 | HR 78 | Ht 64.0 in | Wt 184.0 lb

## 2022-02-13 DIAGNOSIS — Z0000 Encounter for general adult medical examination without abnormal findings: Secondary | ICD-10-CM

## 2022-02-13 DIAGNOSIS — F419 Anxiety disorder, unspecified: Secondary | ICD-10-CM | POA: Diagnosis not present

## 2022-02-13 DIAGNOSIS — E785 Hyperlipidemia, unspecified: Secondary | ICD-10-CM | POA: Diagnosis not present

## 2022-02-13 DIAGNOSIS — K219 Gastro-esophageal reflux disease without esophagitis: Secondary | ICD-10-CM

## 2022-02-13 DIAGNOSIS — Z114 Encounter for screening for human immunodeficiency virus [HIV]: Secondary | ICD-10-CM | POA: Diagnosis not present

## 2022-02-13 DIAGNOSIS — B977 Papillomavirus as the cause of diseases classified elsewhere: Secondary | ICD-10-CM

## 2022-02-13 DIAGNOSIS — Z1159 Encounter for screening for other viral diseases: Secondary | ICD-10-CM | POA: Diagnosis not present

## 2022-02-13 DIAGNOSIS — Z23 Encounter for immunization: Secondary | ICD-10-CM | POA: Diagnosis not present

## 2022-02-13 LAB — LIPID PANEL
Cholesterol: 187 mg/dL (ref 0–200)
HDL: 94.7 mg/dL (ref >39.00)
LDL Cholesterol: 71 mg/dL (ref 0–99)
NonHDL: 92.1
Total CHOL/HDL Ratio: 2
Triglycerides: 105 mg/dL (ref 0.0–149.0)
VLDL: 21 mg/dL (ref 0.0–40.0)

## 2022-02-13 MED ORDER — OMEPRAZOLE 20 MG PO CPDR
20.0000 mg | DELAYED_RELEASE_CAPSULE | Freq: Every day | ORAL | 1 refills | Status: DC
  Filled 2022-02-13: qty 90, 90d supply, fill #0
  Filled 2022-05-03 – 2022-07-19 (×2): qty 90, 90d supply, fill #1

## 2022-02-13 MED ORDER — ALPRAZOLAM 0.25 MG PO TABS
0.2500 mg | ORAL_TABLET | Freq: Two times a day (BID) | ORAL | 0 refills | Status: DC | PRN
  Filled 2022-02-13: qty 30, 15d supply, fill #0

## 2022-02-13 MED ORDER — ONDANSETRON 4 MG PO TBDP
4.0000 mg | ORAL_TABLET | Freq: Three times a day (TID) | ORAL | 0 refills | Status: DC | PRN
  Filled 2022-02-13: qty 20, 7d supply, fill #0

## 2022-02-13 NOTE — Assessment & Plan Note (Signed)
Requesting refill on xanax. Will update UDS today as well as controlled substance contract.  ?

## 2022-02-13 NOTE — Progress Notes (Signed)
? ?Subjective:  ? ?By signing my name below, I, [name], attest that this documentation has been prepared under the direction and in the presence of [name], NP 02/13/2022  ? ? Patient ID: Joan Thomas, female    DOB: [birth year], 36 y.o.   MRN: [id number] ? ?Chief Complaint  ?Patient presents with  ? Annual Exam  ? ? ?HPI ?Patient is in today for a comprehensive physical exam. ? 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 She reports she is not drinking as much even though she still has family issues. This last weekend, she drank 3 beers. She is not too worried about it at this time. ?Pap Smear- Last checked  on 07/13/2019. 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  ?Immunizations- She has 2 Covid-19 vaccines at this time. She will be receiving a tetanus vaccine today. She will receive the HIV/Hepatitis B screening.  ?Diet and Exercise- She is trying to manage a healthy diet since the pancreatitis dx. She does not exercise. She has gained about 20 pounds.  ?Wt Readings from Last 3 Encounters:  ?02/13/22 184 lb (83.5 kg)  ?06/22/21 163 lb (73.9 kg)  ?06/11/21 165 lb (74.8 kg)  ?  ?Vision and Dental-  She is UTD on dental and vision ?Social history- She smokes a cigarette once in a while. She drinks on the weekends.  ? ?She is requesting for a refill on 20 mg omeprazole, 0.25 mg xanax and 4 mg ondansetron.  ? ?No recent surgeries. Her aunt just died of breast cancer. ? ?She denies having any unexpected weight change, ear pain, hearing loss and rhinorrhea, visual disturbance, cough, chest pain and leg swelling, nausea, vomiting, diarrhea and blood in stool, or dysuria and frequency, for myalgias and arthralgias, rash, headaches, adenopathy, depression or anxiety at this time  ? ?Past Medical History:  ?Diagnosis Date  ? Anxiety   ? GERD (gastroesophageal reflux disease)   ? Pancreatitis   ? Vaginal Pap smear, abnormal   ? ? ?Past  Surgical History:  ?Procedure Laterality Date  ? ECTOPIC PREGNANCY SURGERY    ? ESOPHAGEAL MANOMETRY N/A 07/28/2017  ? Procedure: ESOPHAGEAL MANOMETRY (EM);  Surgeon: Mauri Pole, MD;  Location: [facility];  Service: Endoscopy;  Laterality: N/A;  ? [place] IMPEDANCE STUDY N/A 07/28/2017  ? Procedure: [place] IMPEDANCE STUDY;  Surgeon: Mauri Pole, MD;  Location: [facility];  Service: Endoscopy;  Laterality: N/A;  ? ? ?Family History  ?Problem Relation Age of Onset  ? Hypertension Mother   ? Hypertension Sister   ?     thrombocytosis  ? Arthritis Maternal Grandmother   ? GER disease Maternal Grandmother   ? Gout Maternal Grandmother   ? Osteoporosis Maternal Grandmother   ? Diabetes Maternal Grandfather   ? Heart disease Maternal Grandfather   ? Sleep apnea Maternal Grandfather   ? Kidney failure Paternal Grandmother   ? Breast cancer Paternal Grandmother   ? COPD Paternal Grandmother   ? Prostate cancer Paternal Grandfather   ?     several types  ? Breast cancer Maternal Aunt   ? Prostate cancer Maternal Uncle   ? Alcohol abuse Neg Hx   ? Asthma Neg Hx   ? Birth defects Neg Hx   ? Depression Neg Hx   ? Drug abuse Neg Hx   ? Early death Neg Hx   ? Hearing loss Neg Hx   ? Hyperlipidemia Neg Hx   ?  Kidney disease Neg Hx   ? Learning disabilities Neg Hx   ? Mental illness Neg Hx   ? Mental retardation Neg Hx   ? Miscarriages / Stillbirths Neg Hx   ? Stroke Neg Hx   ? Vision loss Neg Hx   ? Esophageal cancer Neg Hx   ? ? ?Social History  ? ?Socioeconomic History  ? Marital status: Married  ?  Spouse name: Not on file  ? Number of children: 0  ? Years of education: Not on file  ? Highest education level: Not on file  ?Occupational History  ? Not on file  ?Tobacco Use  ? Smoking status: Former  ?  Packs/day: 0.50  ?  Types: Cigarettes  ? Smokeless tobacco: Never  ? Tobacco comments:  ?  None since 06/05/21  ?Vaping Use  ? Vaping Use: Former  ? Substances: CBD, Flavoring, Synthetic cannabinoids  ? Devices: hooka   ?Substance and Sexual Activity  ? Alcohol use: Yes  ?  Alcohol/week: 3.0 standard drinks  ?  Types: 3 Cans of beer per week  ?  Comment: daily-none since dx pancreatitis  ? Drug use: No  ? Sexual activity: Yes  ?  Partners: Male  ?  Birth control/protection: None  ?Other Topics Concern  ? Not on file  ?Social History Narrative  ? Works at [employer] , completed an Public librarian.    ? Separated from her husband  ? No children   ? Has a dog  ? Family is out of town.   ? Enjoys cooking/reading.   ? ?Social Determinants of Health  ? ?Financial Resource Strain: Not on file  ?Food Insecurity: Not on file  ?Transportation Needs: Not on file  ?Physical Activity: Not on file  ?Stress: Not on file  ?Social Connections: Not on file  ?Intimate Partner Violence: Not on file  ? ? ?Outpatient Medications Prior to Visit  ?Medication Sig Dispense Refill  ? loratadine (CLARITIN) 10 MG tablet Take 1 tablet (10 mg total) by mouth daily. 90 tablet 4  ? naproxen (NAPROSYN) 500 MG tablet Take 1 tablet (500 mg total) by mouth 2 (two) times daily with a meal. 60 tablet 0  ? ALPRAZolam (XANAX) 0.25 MG tablet Take 1 tablet (0.25 mg total) by mouth 2 (two) times daily as needed for anxiety. 30 tablet 0  ? azithromycin (ZITHROMAX Z-PAK) 250 MG tablet Take 2 tablets by mouth today then take 1 tablet by mouth everyday for 4 days 6 tablet 0  ? benzonatate (TESSALON PERLES) 100 MG capsule Take 1 capsule (100 mg total) by mouth 3 (three) times daily as needed for cough. 20 capsule 0  ? HYDROcodone-acetaminophen (NORCO/VICODIN) 5-325 MG tablet Take 1 tablet by mouth every 4 (four) hours as needed. 12 tablet 0  ? omeprazole (PRILOSEC) 20 MG capsule Take 1 capsule (20 mg total) by mouth daily. 90 capsule 0  ? ondansetron (ZOFRAN ODT) 4 MG disintegrating tablet Take 1 tablet (4 mg total) by mouth every 8 (eight) hours as needed for nausea or vomiting. 20 tablet 0  ? ?No facility-administered medications prior to visit.  ? ? ?No Known  Allergies ? ?Review of Systems  ?Constitutional:  Negative for fever.  ?HENT:  Negative for congestion, ear pain, hearing loss, sinus pain and sore throat.   ?Eyes:  Negative for blurred vision and pain.  ?Respiratory:  Negative for cough, sputum production, shortness of breath and wheezing.   ?Cardiovascular:  Negative for chest pain and palpitations.  ?  Gastrointestinal:  Negative for blood in stool, constipation, diarrhea, nausea and vomiting.  ?Genitourinary:  Negative for dysuria, frequency, hematuria and urgency.  ?Musculoskeletal:  Negative for back pain, falls and myalgias.  ?Skin:   ?     (+) mole in pubic area   ?Neurological:  Negative for dizziness, sensory change, loss of consciousness, weakness and headaches.  ?Endo/Heme/Allergies:  Negative for environmental allergies. Does not bruise/bleed easily.  ?Psychiatric/Behavioral:  Negative for depression and suicidal ideas. The patient is not nervous/anxious and does not have insomnia.   ? ?   ?Objective:  ?  ?Physical Exam ?Constitutional:   ?   General: She is not in acute distress. ?   Appearance: Normal appearance. She is not ill-appearing.  ?HENT:  ?   Head: Normocephalic and atraumatic.  ?   Right Ear: Tympanic membrane, ear canal and external ear normal.  ?   Left Ear: Tympanic membrane, ear canal and external ear normal.  ?Eyes:  ?   Extraocular Movements: Extraocular movements intact.  ?   Pupils: Pupils are equal, round, and reactive to light.  ?Cardiovascular:  ?   Rate and Rhythm: Normal rate and regular rhythm.  ?   Pulses: Normal pulses.  ?   Heart sounds: Normal heart sounds. No murmur heard. ?Pulmonary:  ?   Effort: Pulmonary effort is normal. No respiratory distress.  ?   Breath sounds: Normal breath sounds. No wheezing or rhonchi.  ?Abdominal:  ?   General: Bowel sounds are normal. There is no distension.  ?   Palpations: Abdomen is soft.  ?   Tenderness: There is no abdominal tenderness. There is no guarding or rebound.  ?Musculoskeletal:   ?   Cervical back: Neck supple.  ?   Comments: 5/5 strength on upper and lower extremities  ?Lymphadenopathy:  ?   Cervical: No cervical adenopathy.  ?Skin: ?   General: Skin is warm and dry.  ?Neurological:  ?   Men

## 2022-02-13 NOTE — Patient Instructions (Signed)
Work on [employer] and adding regular exercise (30 minutes five days a week). ?Please follow up with GYN. ?Please get your bivalent Covid booster at your local pharmacy.  ?

## 2022-02-13 NOTE — Assessment & Plan Note (Signed)
Reports symptoms are stable/improved with omeprazole. Continue same.  ?

## 2022-02-13 NOTE — Assessment & Plan Note (Signed)
Wt Readings from Last 3 Encounters:  ?02/13/22 184 lb (83.5 kg)  ?06/22/21 163 lb (73.9 kg)  ?06/11/21 165 lb (74.8 kg)  ? ?Discussed diet, exercise, weight loss. Tdap today. Recomment covid bivalent booster.  ?

## 2022-02-14 LAB — DRUG MONITORING, PANEL 8 WITH CONFIRMATION, URINE
6 Acetylmorphine: NEGATIVE ng/mL (ref <10)
Alcohol Metabolites: NEGATIVE ng/mL (ref <500)
Amphetamines: NEGATIVE ng/mL (ref <500)
Benzodiazepines: NEGATIVE ng/mL (ref <100)
Buprenorphine, Urine: NEGATIVE ng/mL (ref <5)
Cocaine Metabolite: NEGATIVE ng/mL (ref <150)
Creatinine: 187.5 mg/dL (ref >=20.0)
MDMA: NEGATIVE ng/mL (ref <500)
Marijuana Metabolite: NEGATIVE ng/mL (ref <20)
Opiates: NEGATIVE ng/mL (ref <100)
Oxidant: NEGATIVE ug/mL (ref <200)
Oxycodone: NEGATIVE ng/mL (ref <100)
pH: 5.4 (ref 4.5–9.0)

## 2022-02-14 LAB — DM TEMPLATE

## 2022-02-14 LAB — HEPATITIS C ANTIBODY
Hepatitis C Ab: NONREACTIVE
SIGNAL TO CUT-OFF: 0.17 (ref <1.00)

## 2022-02-14 LAB — HIV ANTIBODY (ROUTINE TESTING W REFLEX): HIV 1&2 Ab, 4th Generation: NONREACTIVE

## 2022-02-15 ENCOUNTER — Other Ambulatory Visit (HOSPITAL_BASED_OUTPATIENT_CLINIC_OR_DEPARTMENT_OTHER): Payer: Self-pay

## 2022-03-21 ENCOUNTER — Ambulatory Visit (INDEPENDENT_AMBULATORY_CARE_PROVIDER_SITE_OTHER): Payer: BC Managed Care – PPO | Admitting: Family Medicine

## 2022-03-21 ENCOUNTER — Encounter: Payer: Self-pay | Admitting: Gastroenterology

## 2022-03-21 ENCOUNTER — Ambulatory Visit (INDEPENDENT_AMBULATORY_CARE_PROVIDER_SITE_OTHER): Payer: BC Managed Care – PPO | Admitting: Gastroenterology

## 2022-03-21 ENCOUNTER — Encounter: Payer: Self-pay | Admitting: Family Medicine

## 2022-03-21 ENCOUNTER — Other Ambulatory Visit (HOSPITAL_COMMUNITY)
Admission: RE | Admit: 2022-03-21 | Discharge: 2022-03-21 | Disposition: A | Discharge status: Home / Self Care | Payer: BC Managed Care – PPO | Source: Ambulatory Visit | Attending: Family Medicine | Admitting: Family Medicine

## 2022-03-21 VITALS — BP 130/80 | HR 70 | Ht 64.0 in | Wt 187.0 lb

## 2022-03-21 VITALS — BP 118/74 | HR 79 | Ht 64.0 in | Wt 187.0 lb

## 2022-03-21 DIAGNOSIS — Z01419 Encounter for gynecological examination (general) (routine) without abnormal findings: Secondary | ICD-10-CM

## 2022-03-21 DIAGNOSIS — K852 Alcohol induced acute pancreatitis without necrosis or infection: Secondary | ICD-10-CM

## 2022-03-21 DIAGNOSIS — R933 Abnormal findings on diagnostic imaging of other parts of digestive tract: Secondary | ICD-10-CM

## 2022-03-21 NOTE — Progress Notes (Signed)
GYNECOLOGY ANNUAL PREVENTATIVE CARE ENCOUNTER NOTE  Subjective:   Joan Thomas is a 36 y.o. G48P0020 female here for a routine annual gynecologic exam.  Current complaints: None.   Denies abnormal vaginal bleeding, discharge, pelvic pain, problems with intercourse or other gynecologic concerns.    Gynecologic History Patient's last menstrual period was 02/26/2022 (exact date). Patient is sexually active  Contraception: condoms Last Pap: 2020. Results were: ASCUS + HPV. Normal colpo Last mammogram: n/a.    The pregnancy intention screening data noted above was reviewed. Potential methods of contraception were discussed. The patient elected to proceed with No data recorded.   Obstetric History OB History  Gravida Para Term Preterm AB Living  2       2    SAB IAB Ectopic Multiple Live Births  2            # Outcome Date GA Lbr Len/2nd Weight Sex Delivery Anes PTL Lv  2 SAB 2017          1 SAB 2014             Birth Comments: System Generated. Please review and update pregnancy details.    Past Medical History:  Diagnosis Date   Anxiety    GERD (gastroesophageal reflux disease)    Pancreatitis    Vaginal Pap smear, abnormal     Past Surgical History:  Procedure Laterality Date   ECTOPIC PREGNANCY SURGERY     ESOPHAGEAL MANOMETRY N/A 07/28/2017   Procedure: ESOPHAGEAL MANOMETRY (EM);  Surgeon: Napoleon Form, MD;  Location: [facility];  Service: Endoscopy;  Laterality: N/A;   PH IMPEDANCE STUDY N/A 07/28/2017   Procedure: PH IMPEDANCE STUDY;  Surgeon: Napoleon Form, MD;  Location: [facility];  Service: Endoscopy;  Laterality: N/A;    Current Outpatient Medications on File Prior to Visit  Medication Sig Dispense Refill   ALPRAZolam (XANAX) 0.25 MG tablet Take 1 tablet (0.25 mg total) by mouth 2 (two) times daily as needed for anxiety. 30 tablet 0   loratadine (CLARITIN) 10 MG tablet Take 1 tablet (10 mg total) by mouth daily. 90 tablet 4    naproxen (NAPROSYN) 500 MG tablet Take 1 tablet (500 mg total) by mouth 2 (two) times daily with a meal. 60 tablet 0   omeprazole (PRILOSEC) 20 MG capsule Take 1 capsule (20 mg total) by mouth daily. 90 capsule 1   ondansetron (ZOFRAN-ODT) 4 MG disintegrating tablet Take 1 tablet (4 mg total) by mouth every 8 (eight) hours as needed for nausea or vomiting. 20 tablet 0   No current facility-administered medications on file prior to visit.    No Known Allergies  Social History   Socioeconomic History   Marital status: Married    Spouse name: Not on file   Number of children: 0   Years of education: Not on file   Highest education level: Not on file  Occupational History   Not on file  Tobacco Use   Smoking status: Former    Packs/day: 0.50    Types: Cigarettes   Smokeless tobacco: Never   Tobacco comments:    None since 06/05/21  Vaping Use   Vaping Use: Former   Substances: CBD, Flavoring, Synthetic cannabinoids   Devices: hooka  Substance and Sexual Activity   Alcohol use: Yes    Alcohol/week: 3.0 standard drinks    Types: 3 Cans of beer per week    Comment: daily-none since dx pancreatitis   Drug use:  No   Sexual activity: Yes    Partners: Male    Birth control/protection: None  Other Topics Concern   Not on file  Social History Narrative   Works at [employer] , completed an Metallurgist.     Separated from her husband   No children    Has a dog   Family is out of town.    Enjoys cooking/reading.    Social Determinants of Health   Financial Resource Strain: Not on file  Food Insecurity: Not on file  Transportation Needs: Not on file  Physical Activity: Not on file  Stress: Not on file  Social Connections: Not on file  Intimate Partner Violence: Not on file    Family History  Problem Relation Age of Onset   Hypertension Mother    Hypertension Sister        thrombocytosis   Arthritis Maternal Grandmother    GER disease Maternal Grandmother     Gout Maternal Grandmother    Osteoporosis Maternal Grandmother    Diabetes Maternal Grandfather    Heart disease Maternal Grandfather    Sleep apnea Maternal Grandfather    Kidney failure Paternal Grandmother    Breast cancer Paternal Grandmother    COPD Paternal Grandmother    Prostate cancer Paternal Grandfather        several types   Breast cancer Maternal Aunt    Prostate cancer Maternal Uncle    Alcohol abuse Neg Hx    Asthma Neg Hx    Birth defects Neg Hx    Depression Neg Hx    Drug abuse Neg Hx    Early death Neg Hx    Hearing loss Neg Hx    Hyperlipidemia Neg Hx    Kidney disease Neg Hx    Learning disabilities Neg Hx    Mental illness Neg Hx    Mental retardation Neg Hx    Miscarriages / Stillbirths Neg Hx    Stroke Neg Hx    Vision loss Neg Hx    Esophageal cancer Neg Hx     The following portions of the patient's history were reviewed and updated as appropriate: allergies, current medications, past family history, past medical history, past social history, past surgical history and problem list.  Review of Systems Pertinent items are noted in HPI.   Objective:  BP 118/74   Pulse 79   Ht 5\' 4"  (1.626 m)   Wt 187 lb (84.8 kg)   LMP 02/26/2022 (Exact Date)   BMI 32.10 kg/m  Wt Readings from Last 3 Encounters:  03/21/22 187 lb (84.8 kg)  03/21/22 187 lb (84.8 kg)  02/13/22 184 lb (83.5 kg)     Chaperone present during exam  CONSTITUTIONAL: Well-developed, well-nourished female in no acute distress.  HENT:  Normocephalic, atraumatic, External right and left ear normal. Oropharynx is clear and moist EYES: Conjunctivae and EOM are normal. Pupils are equal, round, and reactive to light. No scleral icterus.  NECK: Normal range of motion, supple, no masses.  Normal thyroid.   CARDIOVASCULAR: Normal heart rate noted, regular rhythm RESPIRATORY: Clear to auscultation bilaterally. Effort and breath sounds normal, no problems with respiration noted. BREASTS:  Symmetric in size. No masses, skin changes, nipple drainage, or lymphadenopathy. ABDOMEN: Soft, normal bowel sounds, no distention noted.  No tenderness, rebound or guarding.  PELVIC: Normal appearing external genitalia; normal appearing vaginal mucosa and cervix.  No abnormal discharge noted.  Normal uterine size, no other palpable masses, no uterine or adnexal  tenderness. MUSCULOSKELETAL: Normal range of motion. No tenderness.  No cyanosis, clubbing, or edema.  2+ distal pulses. SKIN: Skin is warm and dry. No rash noted. Not diaphoretic. No erythema. No pallor. NEUROLOGIC: Alert and oriented to person, place, and time. Normal reflexes, muscle tone coordination. No cranial nerve deficit noted. PSYCHIATRIC: Normal mood and affect. Normal behavior. Normal judgment and thought content.  Assessment:  Annual gynecologic examination with pap smear   Plan:  1. Well Woman Exam Will follow up results of pap smear and manage accordingly. STD testing discussed. Patient requested vaginal testing  - Cytology - PAP( [facility])   Routine preventative health maintenance measures emphasized. Please refer to After Visit Summary for other counseling recommendations.    Candelaria CelesteJacob Alyna Stensland, DO [facility]

## 2022-03-21 NOTE — Patient Instructions (Signed)
If you are age 37 or older, your body mass index should be between 23-30. Your Body mass index is 32.1 kg/m. If this is out of the aforementioned range listed, please consider follow up with your Primary Care Provider.  If you are age 76 or younger, your body mass index should be between 19-25. Your Body mass index is 32.1 kg/m. If this is out of the aformentioned range listed, please consider follow up with your Primary Care Provider.   ________________________________________________________  The [facility] providers would like to encourage you to use [facility] to communicate with providers for non-urgent requests or questions.  Due to long hold times on the telephone, sending your provider a message by [facility] may be a faster and more efficient way to get a response.  Please allow 48 business hours for a response.  Please remember that this is for non-urgent requests.  _______________________________________________________  Follow up as needed.  It was a pleasure to see you today!  Thank you for trusting me with your gastrointestinal care!

## 2022-03-21 NOTE — Progress Notes (Signed)
[facility] Consult Note:  History: Joan Thomas 03/21/2022  Referring provider: [name], NP  Reason for consult/chief complaint: Pancreatitis (Follow up on abnormal labs. Did have some recent abdomen pain, but admits to heavy drinking due to a death in the family. )   Subjective  HPI: Joan Thomas was last seen by me in November 2018 for chronic heartburn and dysphagia with normal EGD manometry and pH/impedance testing off PPI.  She was advised to quit smoking to decrease symptoms. She was last seen here in August 2022 by our APP after an ED visit for alcohol related pancreatitis.  She was referred back to [place] today for evaluation of alcohol related pancreatitis.  She was in "[facility] emergency department" in [place], [place] on 01/10/2022 presenting with upper abdominal pain to the back with nausea and vomiting for 2 days.  Triage note indicates patient was consuming 14-15 shots of liquor daily plus beer.  ED provider note including the following: "Lipase is elevated at 182 today. CT scan shows severe deuodenitis versus pancreatitis. Case reviewed with Dr. Kenton Kingfisher and Dr. Benay Spice was consulted. Joan Thomas would like to go home and avoid hospital admission if at all. Dr. Benay Spice has kindly offered to see her in his office and contact information was given to Joan Thomas to call tomorrow to schedule that appointment. Omeprazole will be reordered and Zofran has been sent to her pharmacy as well. Joan Thomas was strongly discouraged from drinking any more alcohol as this will only worsen her condition. She was advised to follow a bland diet, mostly liquids, and advance as tolerated. If symptoms should worsen, return to the ED."  Joan Thomas says her abdominal pain and nausea have resolved and she is feeling back to normal. She was grieving the loss of an aunt and binge drank leading up to the recent episode of pancreatitis. Her bowel habits are regular and rectal bleeding.  She  has intermittent heartburn for which primary care prescribes PPI. ROS:  Review of Systems  Constitutional:  Negative for appetite change and unexpected weight change.  HENT:  Negative for mouth sores and voice change.   Eyes:  Negative for pain and redness.  Respiratory:  Negative for cough and shortness of breath.   Cardiovascular:  Negative for chest pain and palpitations.  Genitourinary:  Negative for dysuria and hematuria.  Musculoskeletal:  Negative for arthralgias and myalgias.  Skin:  Negative for pallor and rash.  Allergic/Immunologic: Positive for environmental allergies.  Neurological:  Negative for weakness and headaches.  Hematological:  Negative for adenopathy.    Past Medical History: Past Medical History:  Diagnosis Date   Anxiety    GERD (gastroesophageal reflux disease)    Pancreatitis    Vaginal Pap smear, abnormal      Past Surgical History: Past Surgical History:  Procedure Laterality Date   ECTOPIC PREGNANCY SURGERY     ESOPHAGEAL MANOMETRY N/A 07/28/2017   Procedure: ESOPHAGEAL MANOMETRY (EM);  Surgeon: Mauri Pole, MD;  Location: [facility];  Service: Endoscopy;  Laterality: N/A;   [place] IMPEDANCE STUDY N/A 07/28/2017   Procedure: [place] IMPEDANCE STUDY;  Surgeon: Mauri Pole, MD;  Location: [facility];  Service: Endoscopy;  Laterality: N/A;     Family History: Family History  Problem Relation Age of Onset   Hypertension Mother    Hypertension Sister        thrombocytosis   Arthritis Maternal Grandmother    GER disease Maternal Grandmother    Gout Maternal Grandmother  Osteoporosis Maternal Grandmother    Diabetes Maternal Grandfather    Heart disease Maternal Grandfather    Sleep apnea Maternal Grandfather    Kidney failure Paternal Grandmother    Breast cancer Paternal Grandmother    COPD Paternal Grandmother    Prostate cancer Paternal Grandfather        several types   Breast cancer Maternal Aunt    Prostate cancer  Maternal Uncle    Alcohol abuse Neg Hx    Asthma Neg Hx    Birth defects Neg Hx    Depression Neg Hx    Drug abuse Neg Hx    Early death Neg Hx    Hearing loss Neg Hx    Hyperlipidemia Neg Hx    Kidney disease Neg Hx    Learning disabilities Neg Hx    Mental illness Neg Hx    Mental retardation Neg Hx    Miscarriages / Stillbirths Neg Hx    Stroke Neg Hx    Vision loss Neg Hx    Esophageal cancer Neg Hx     Social History: Social History   Socioeconomic History   Marital status: Married    Spouse name: Not on file   Number of children: 0   Years of education: Not on file   Highest education level: Not on file  Occupational History   Not on file  Tobacco Use   Smoking status: Former    Packs/day: 0.50    Types: Cigarettes   Smokeless tobacco: Never   Tobacco comments:    None since 06/05/21  Vaping Use   Vaping Use: Former   Substances: CBD, Flavoring, Synthetic cannabinoids   Devices: hooka  Substance and Sexual Activity   Alcohol use: Yes    Alcohol/week: 3.0 standard drinks    Types: 3 Cans of beer per week    Comment: daily-none since dx pancreatitis   Drug use: No   Sexual activity: Yes    Partners: Male    Birth control/protection: None  Other Topics Concern   Not on file  Social History Narrative   Works at [employer] , completed an Public librarian.     Separated from her husband   No children    Has a dog   Family is out of town.    Enjoys cooking/reading.    Social Determinants of Health   Financial Resource Strain: Not on file  Food Insecurity: Not on file  Transportation Needs: Not on file  Physical Activity: Not on file  Stress: Not on file  Social Connections: Not on file    Allergies: No Known Allergies  Outpatient Meds: Current Outpatient Medications  Medication Sig Dispense Refill   ALPRAZolam (XANAX) 0.25 MG tablet Take 1 tablet (0.25 mg total) by mouth 2 (two) times daily as needed for anxiety. 30 tablet 0   loratadine  (CLARITIN) 10 MG tablet Take 1 tablet (10 mg total) by mouth daily. 90 tablet 4   naproxen (NAPROSYN) 500 MG tablet Take 1 tablet (500 mg total) by mouth 2 (two) times daily with a meal. 60 tablet 0   omeprazole (PRILOSEC) 20 MG capsule Take 1 capsule (20 mg total) by mouth daily. 90 capsule 1   ondansetron (ZOFRAN-ODT) 4 MG disintegrating tablet Take 1 tablet (4 mg total) by mouth every 8 (eight) hours as needed for nausea or vomiting. 20 tablet 0   No current facility-administered medications for this visit.      ___________________________________________________________________ Objective   Exam:  BP 130/80   Pulse 70   Ht 5\' 4"  (1.626 m)   Wt 187 lb (84.8 kg)   BMI 32.10 kg/m  Wt Readings from Last 3 Encounters:  03/21/22 187 lb (84.8 kg)  02/13/22 184 lb (83.5 kg)  06/22/21 163 lb (73.9 kg)    General: Well-appearing Eyes: sclera anicteric, no redness ENT: oral mucosa moist without lesions, no cervical or supraclavicular lymphadenopathy CV: RRR without murmur, S1/S2, no JVD, no peripheral edema Resp: clear to auscultation bilaterally, normal RR and effort noted GI: soft, no tenderness, with active bowel sounds. No guarding or palpable organomegaly noted. Skin; warm and dry, no rash or jaundice noted Neuro: awake, alert and oriented x 3. Normal gross motor function and fluent speech  Labs:  WBC 5.7, hemoglobin 12.7, platelet 198 Negative urine pregnancy test Alcohol level negative Toxicology negative INR normal Lipase 182 Lactate 0.7 Sodium 135, potassium 1.7, chloride 101, bicarb 25, BUN 7, creatinine 0.73 Glucose 101, calcium 9.1, albumin 4.2, total bilirubin 1.1, AST 28, ALT 24, alkaline phosphatase 56  Radiologic Studies:  CLINICAL: Nausea, emesis, left upper quadrant and epigastric pain. History of pancreatitis  CT, ABDOMEN: This study was performed January 10, 2022, 5:58 PM and interpreted in comparison to no similar exam.  This study was acquired with a  16 slice helical scanner with iterative dose reduction. Per oral and non-ionic intravenous iodinated contrast was utilized. A total of 100 ml of Omnipaque 350 contrast was instilled mechanically with a CT-1[employer] MEDRAD. Saline bolus followed. The patient tolerated the infusion without complaint or untoward effects. MPR diagnostic images are presented for careful review and consideration. CT Dose Index (vol) is: 12.9 mGY.  The lung bases are clear without pleural effusion or parenchymal infiltrative abnormality. No pulmonary nodule or pleural mass projects within the lung bases. The heart is not obviously enlarged. I see no pericardial effusion. There is no sizable hiatal hernia. There is wall thickening and surrounding fat stranding in the duodenum. I don't see evidence of gross ulceration or perforation.  The liver and spleen are both normal in size and configuration without suspicious focal defect. The hepatic veins are patent without thrombosis.  Opacification of the portal vein is noted. Intrahepatic biliary ductal dilatation is not evident. The gallbladder is unremarkable without direct evidence of cholelithiasis or dilatation. I see no pericholecystic fluid. The common bile duct (CBD) is not unusually prominent. No intra duct stone is suggested. The pancreas shows normal configuration without mass, pseudocyst or frank peripancreatic inflammation. The pancreatic duct is not abnormally prominent.   I identify no adrenal mass.   The kidneys are both functional without hydronephrosis or solid/complex mass effect. Cortical thickness is preserved. I see no obvious nephrolithiasis.   There is no bulky retroperitoneal adenopathy, free fluid or abscess pattern. The abdominal aorta demonstrates no atherosclerotic vascular disease and no evidence of aneurysm The IVC is not abnormally flattened nor engorged. The is no evidence of IVC occlusion or altered venous return.  There is wall thickening with fatty  infiltration involving the transverse and descending colon. No free air, portal or mesenteric air is noted. I appreciate no unexplained or suspicious foreign body. Fecal loading is not unusually prominent.  The anterior abdominal wall is intact without ventral hernia, subcutaneous mass or evidence of frank inflammation. There is no gross asymmetry of the abdominal wall musculature.   CT, PELVIS: This study was performed January 10, 2022, 5:58 PM and interpreted in comparison to abdomen.  This study was acquired with  a 64 slice helical scanner with iterative dose reduction.  Within the pelvis no neoplastic or inflammatory mass effect is identified.  The urinary bladder is not distended, demonstrates no stone or mass effect or gross wall thickening. There is no evidence of a distal ureteral stone. There is no pelvic or inguinal adenopathy.   The uterus is is abnormally bulky and asymmetric with a large multilobulated hyper vascular mass along the left lateral aspect measuring up to 4.8 cm on coronal reformatted images. There are no associated calcifications. This appears to be subserosal. No abnormal collection of fluid is appreciated within the endouterine cavity. The ovaries are tentatively identified. There is no solid or cystic adnexal mass effect.   No free fluid or abscess pattern is revealed. The sigmoid reveals evidence of no diverticulosis. I see no peri-colonic inflammation. The appendix is tentatively seen. There is no overt peri-cecal/appendiceal inflammation. The ischiorectal fossa are clear. The lumbar spine and pelvis demonstrate no specific abnormality. No lytic or blastic lesion is revealed.  IMPRESSION: 1. Wall thickening with local fat stranding primarily involving the duodenal sweep suggesting severe duodenitis more so than pancreatitis. The pancreatic body and tail have a much more normal appearance. Differential considerations also include groove pancreatitis. 2. Abnormal uterus  with multilobulated hypervascular mass effect. This is probably a uterine leiomyoma statistically but the appearance is unusual. I recommend follow-up imaging      Signed (Electronic Signature): 01/10/2022 6:11 PM  Signed By: Deatra Robinson, MD      Assessment and plan: Encounter Diagnoses  Name Primary?   Alcohol-induced acute pancreatitis without infection or necrosis Yes   Abnormal finding on GI tract imaging     Second episode of alcohol induced acute pancreatitis.  The pancreatic inflammation causes adjacent distal gastric and proximal duodenal inflammation seen on CT scan.  This occurred with her previous episode and does not need upper endoscopy for further evaluation.  I do not think she needs further work-up for other causes of pancreatitis, and she is strongly advised to be completely abstinent from alcohol.  Her pancreas is likely to be more sensitive and therefore more likely develop pancreatitis even with modest alcohol intake in the future.  Also, if she feels her episodic anxiety or other mood issues need help, she is encouraged to contact primary care to discuss talk therapy, medicine or both rather than coping with alcohol use.  At this point, no further testing or treatment needed for this resolved second episode of pancreatitis.  Najaya has an appointment later today with gynecology to address the uterine findings on CT.  Thank you for the courtesy of this consult.  Please call me with any questions or concerns.  Nelida Meuse III  CC: Referring provider noted above

## 2022-03-28 ENCOUNTER — Other Ambulatory Visit: Payer: Self-pay | Admitting: Family Medicine

## 2022-03-28 ENCOUNTER — Other Ambulatory Visit (HOSPITAL_BASED_OUTPATIENT_CLINIC_OR_DEPARTMENT_OTHER): Payer: Self-pay

## 2022-03-28 LAB — CYTOLOGY - PAP
Chlamydia: NEGATIVE
Comment: NEGATIVE
Comment: NEGATIVE
Comment: NEGATIVE
Comment: NORMAL
Diagnosis: NEGATIVE
High risk HPV: NEGATIVE
Neisseria Gonorrhea: NEGATIVE
Trichomonas: POSITIVE — AB

## 2022-03-28 MED ORDER — METRONIDAZOLE 500 MG PO TABS
500.0000 mg | ORAL_TABLET | Freq: Two times a day (BID) | ORAL | 0 refills | Status: AC
  Filled 2022-03-28 – 2022-07-19 (×3): qty 14, 7d supply, fill #0

## 2022-04-04 ENCOUNTER — Other Ambulatory Visit (HOSPITAL_BASED_OUTPATIENT_CLINIC_OR_DEPARTMENT_OTHER): Payer: Self-pay

## 2022-04-12 IMAGING — CT CT ABD-PELV W/ CM
2 of 4 series · 15 of 46 positions shown, 17 images · IV contrast (Omnipaque)
Comparison: Same-day right upper quadrant ultrasound.

CLINICAL DATA: Mid upper abdominal pain since this a.m.

EXAM:
CT ABDOMEN AND PELVIS WITH CONTRAST
TECHNIQUE: Multidetector CT imaging of the abdomen and pelvis was performed
using the standard protocol following bolus administration of
intravenous contrast.
CONTRAST:  100mL OMNIPAQUE IOHEXOL 300 MG/ML  SOLN

[Series 2: axial st · axial · 0.91mm/px · z∈[-530,-134]mm · 12 of 87 slices shown, 14 images]
[im 4/87  soft-tissue]
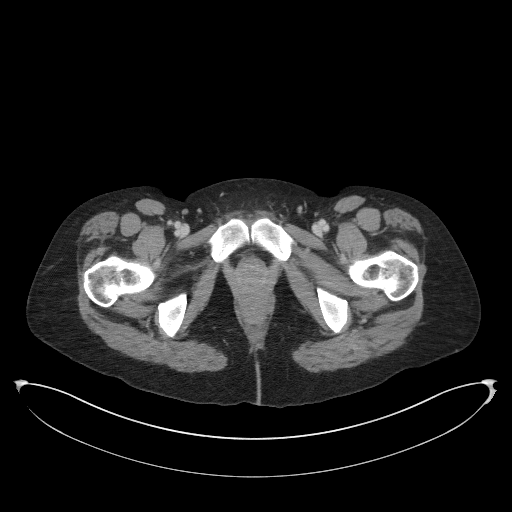
[im 4/87  bone]
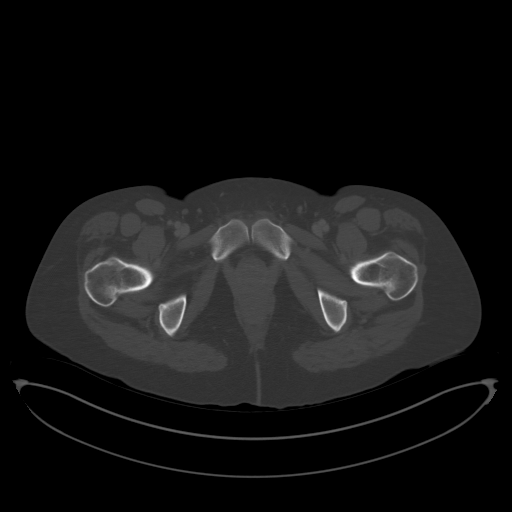
[im 11/87  soft-tissue]
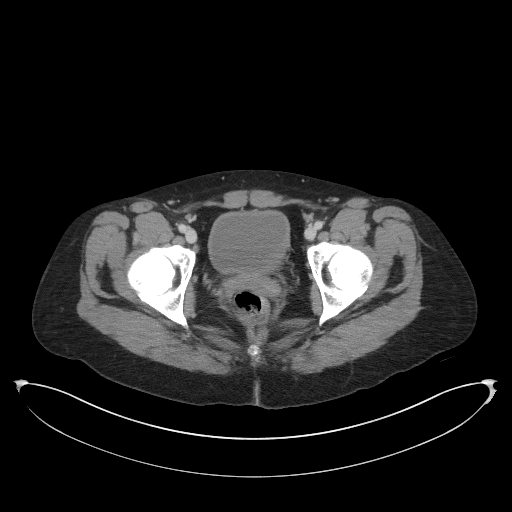
[im 18/87  soft-tissue]
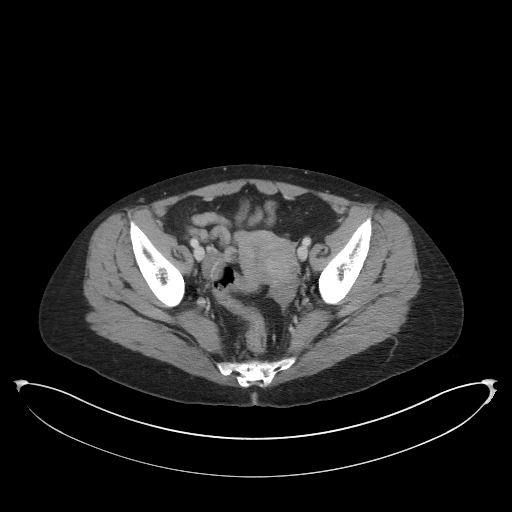
[im 26/87  soft-tissue]
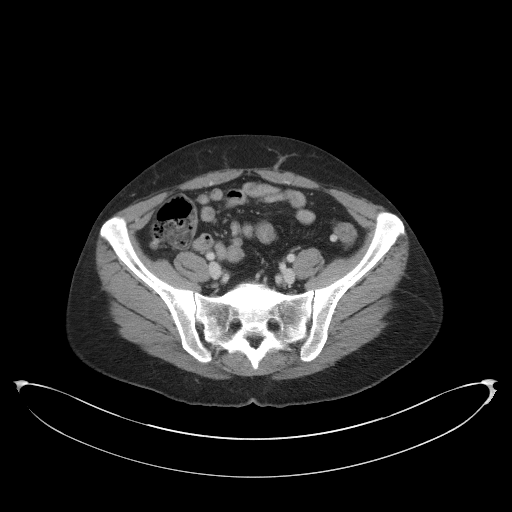
[im 33/87  soft-tissue]
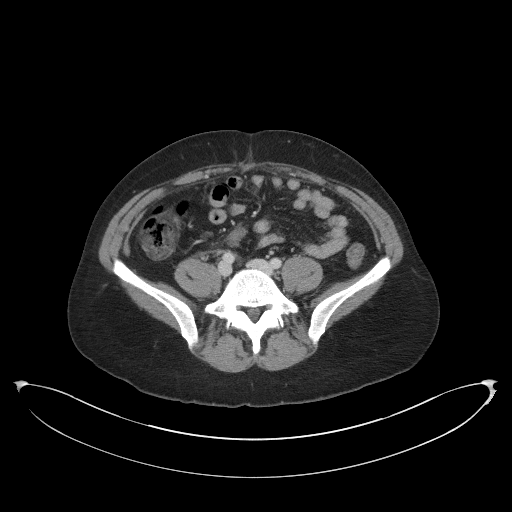
[im 40/87  soft-tissue]
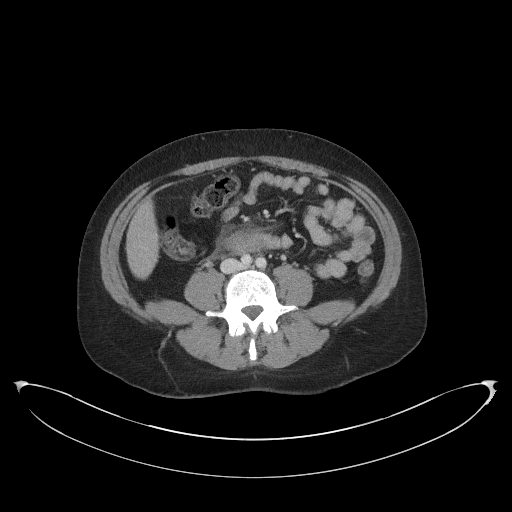
[im 47/87  soft-tissue]
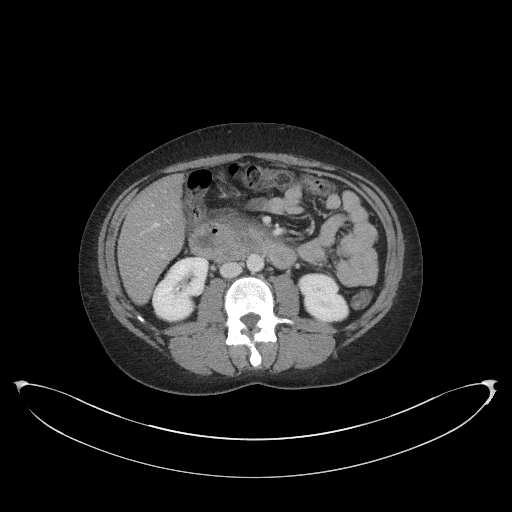
[im 54/87  soft-tissue]
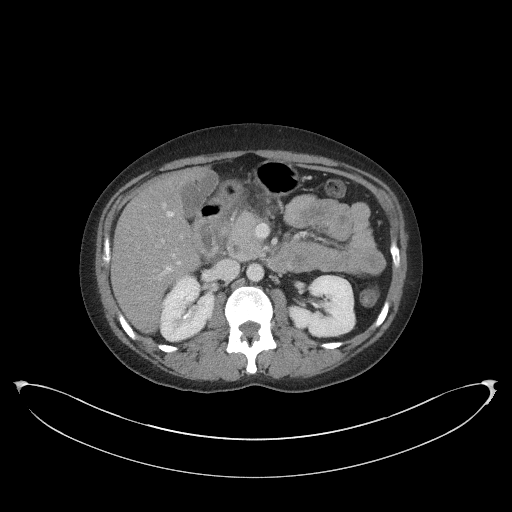
[im 61/87  soft-tissue]
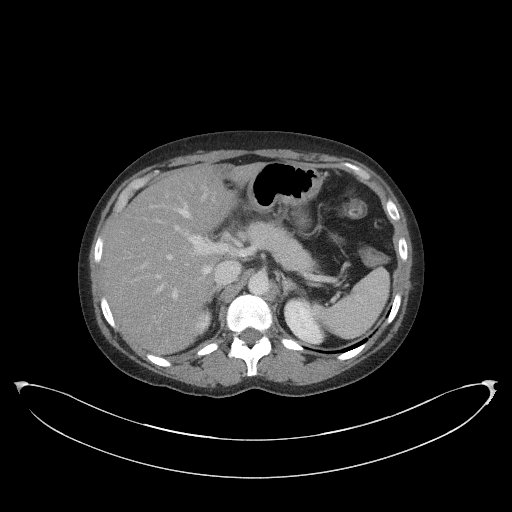
[im 61/87  bone]
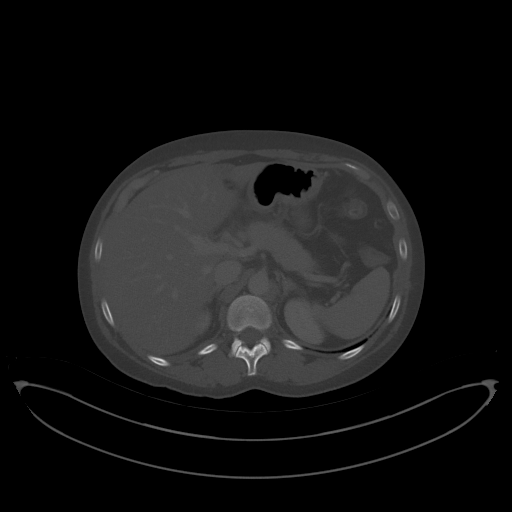
[im 69/87  soft-tissue]
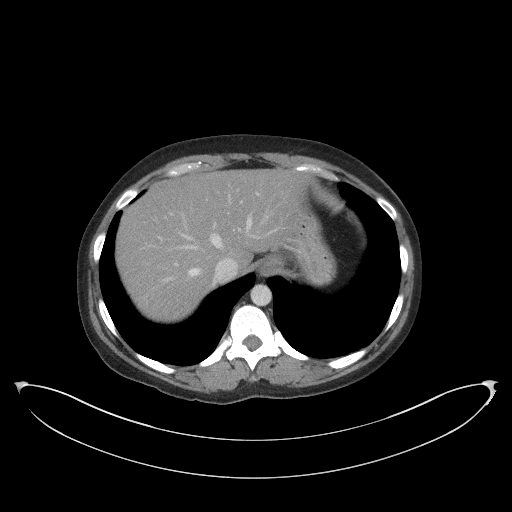
[im 76/87  soft-tissue]
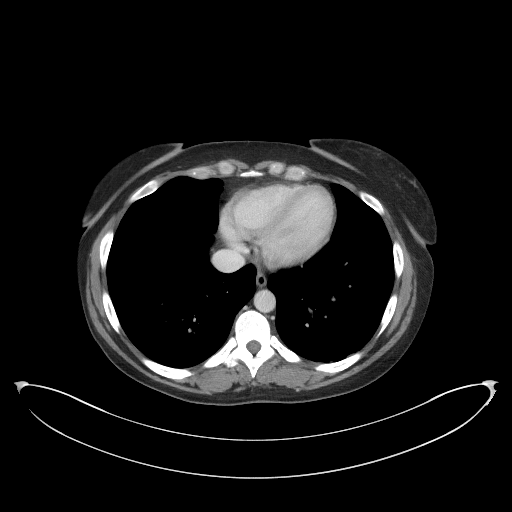
[im 83/87  soft-tissue]
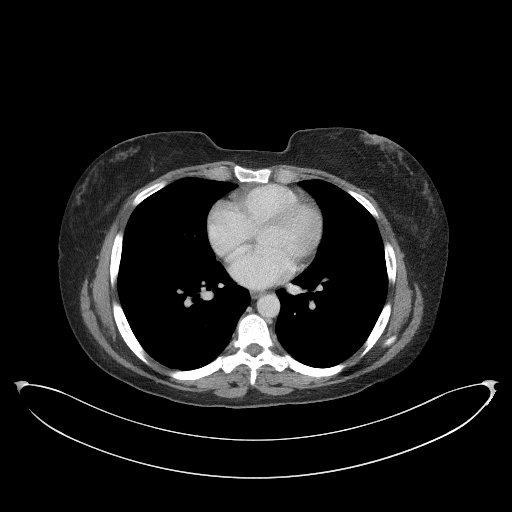

[Series 5: coronal st · coronal · 0.64mm/px · 3 of 100 slices shown]
[im 34/100  soft-tissue]
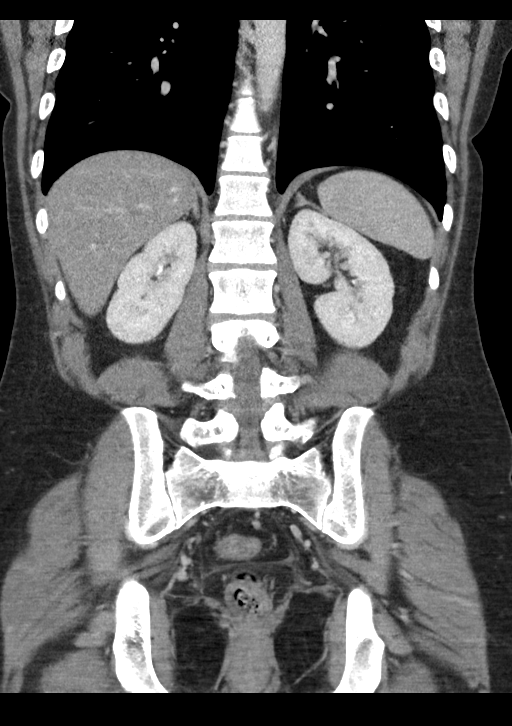
[im 45/100  soft-tissue]
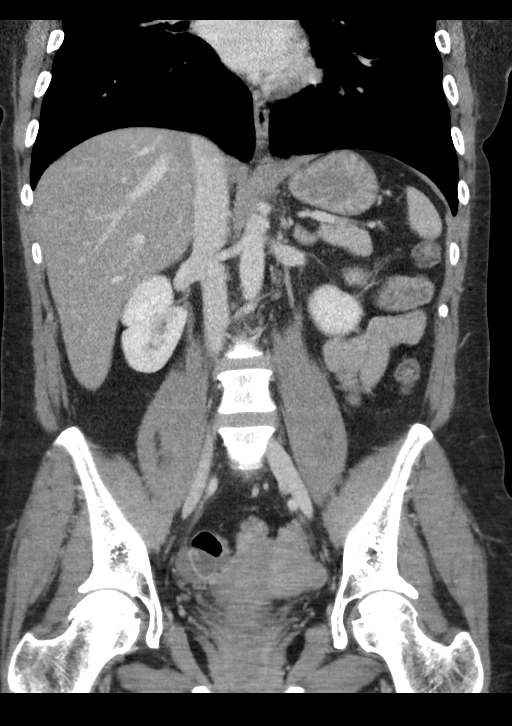
[im 56/100  soft-tissue]
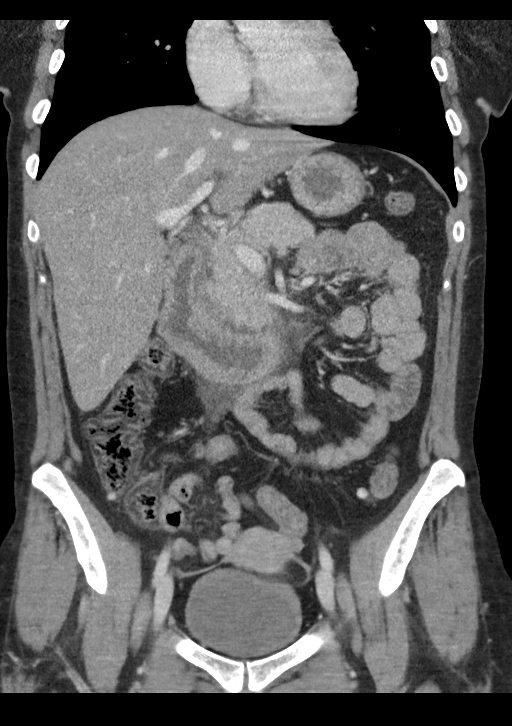

[15 of 46 positions shown; findings below may reference images not displayed]

FINDINGS: Lower chest: No acute abnormality.

Hepatobiliary: Diffuse hepatic steatosis. Gallbladder is
unremarkable. No biliary ductal dilation.

Pancreas: Inflammatory stranding head of pancreas the epicenter
along the pancreaticoduodenal groove with some edema the pancreatic
parenchyma in this area. No pancreatic ductal dilation.

Spleen: Within normal limits.

Adrenals/Urinary Tract: Adrenal glands are unremarkable. Kidneys are
normal, without renal calculi, solid enhancing lesion, or
hydronephrosis. Bladder is unremarkable degree of distension.

Stomach/Bowel: Ingested tablets layering stomach. Wall thickening of
the gastric antrum extending to the third part of the duodenum
adjacent inflammatory stranding. No pathologic dilation of small
bowel. The appendix and terminal ileum are unremarkable. Diffuse
fatty infiltration the colonic wall. Few scattered colonic
diverticula without findings of acute diverticulitis.

Vascular/Lymphatic: No significant vascular findings are present. No
pathologically enlarged abdominal or pelvic lymph nodes.

Reproductive: Enhancing 4.2 cm soft tissue mass extending from the
uterine parenchyma likely reflecting uterine leiomyoma. Right
ovarian corpus luteum. Left ovary is unremarkable.

Other: Peripancreatic and para duodenal inflammatory stranding which
extends along the mesenteric root. No walled off fluid collections.
No pneumoperitoneum. No portal venous gas.

Musculoskeletal: No acute or significant osseous findings.
IMPRESSION: 1. Inflammatory stranding along the head of the pancreas, with the
epicenter along the pancreaticoduodenal groove, with some edema of
the pancreatic parenchyma in this area. Findings likely reflecting
acute interstitial pancreatitis in a pattern most consistent with
groove pancreatitis. No walled off fluid collection or evidence of
pancreatic necrosis.
2. Wall thickening of the gastric antrum extending to the third part
of the duodenum with adjacent inflammatory stranding, suggestive of
reactive gastritis/duodenitis.
3. Diffuse hepatic steatosis.

## 2022-04-12 IMAGING — US US ABDOMEN LIMITED
1 series · 14 of 25 positions shown · non-contrast
Comparison: None.

CLINICAL DATA: Abdominal pain with nausea and vomiting, initial
encounter

EXAM:
ULTRASOUND ABDOMEN LIMITED RIGHT UPPER QUADRANT

[Series 1: us abdomen limited · 0.22mm/px · 14 of 59 slices shown]
[im 1/59]
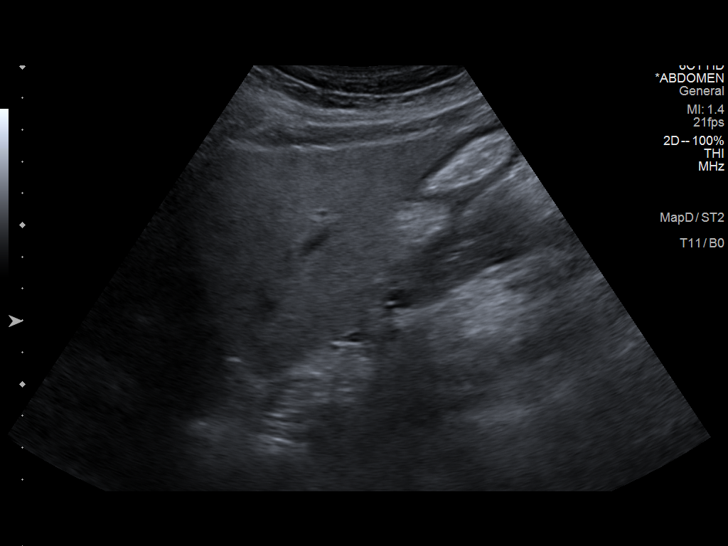
[im 5/59]
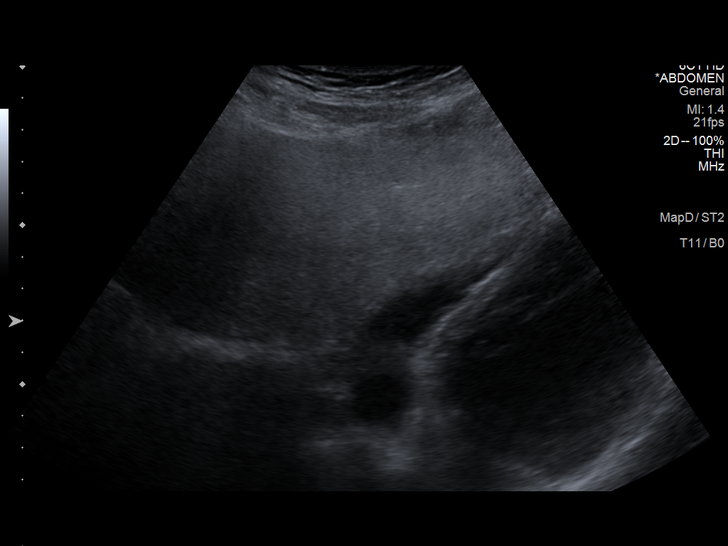
[im 10/59]
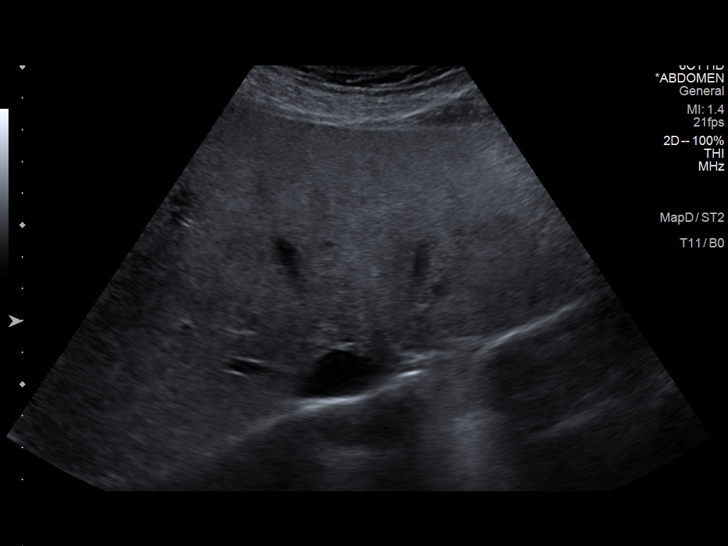
[im 15/59]
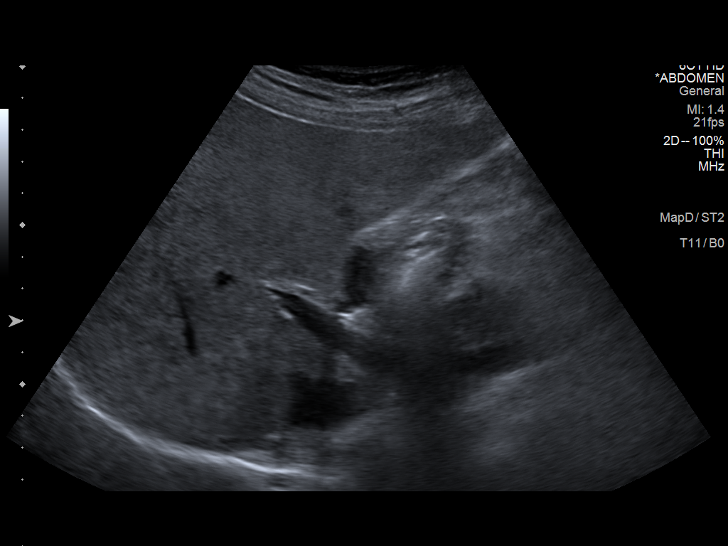
[im 20/59]
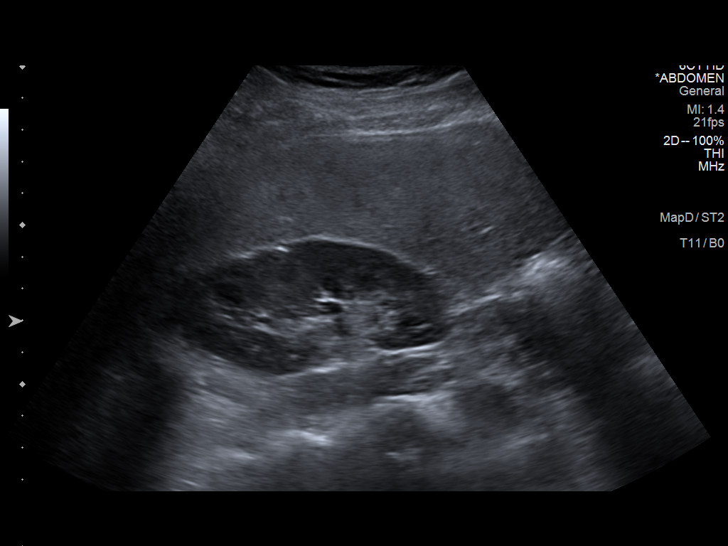
[im 22/59]
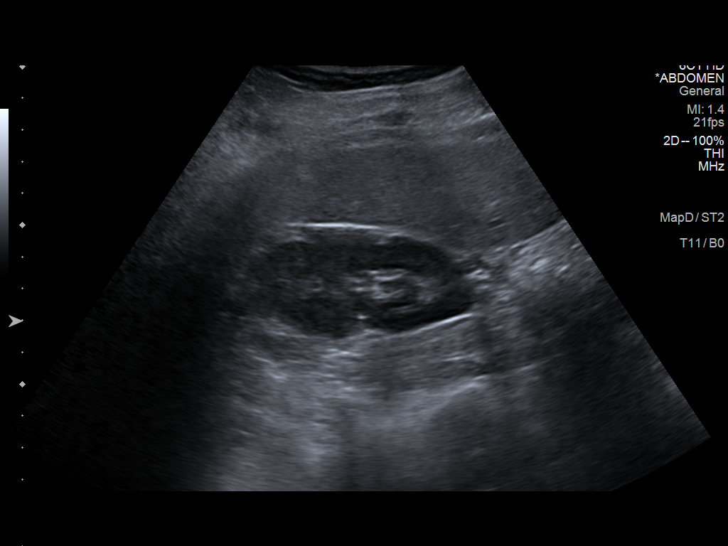
[im 27/59]
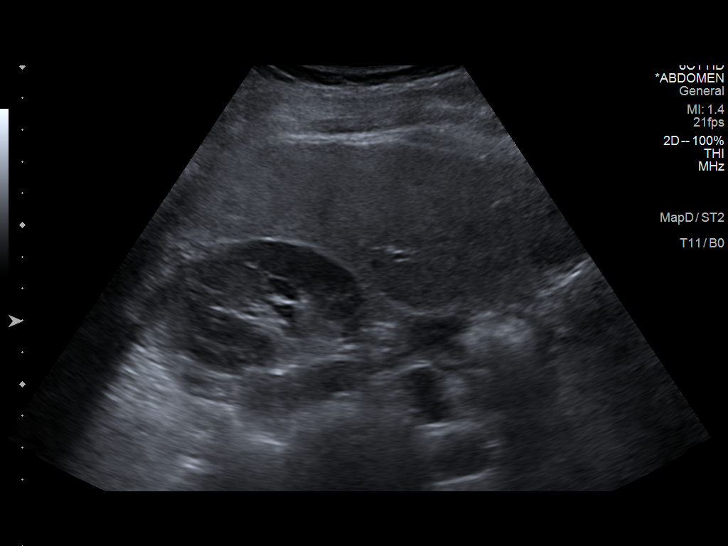
[im 32/59]
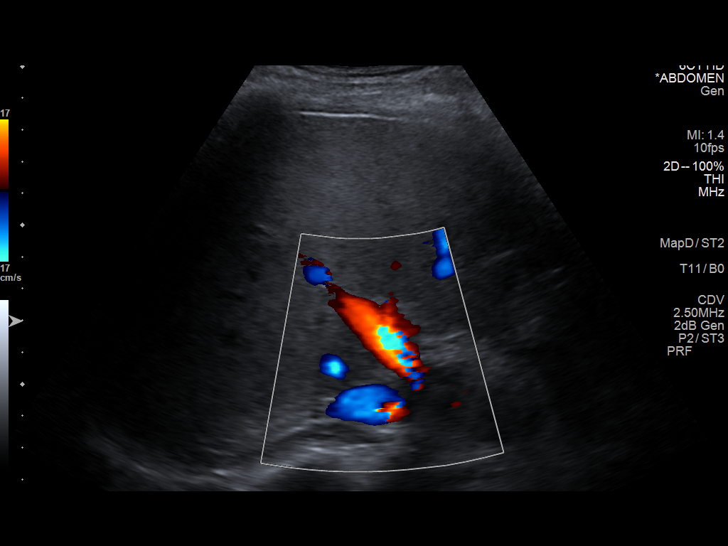
[im 37/59]
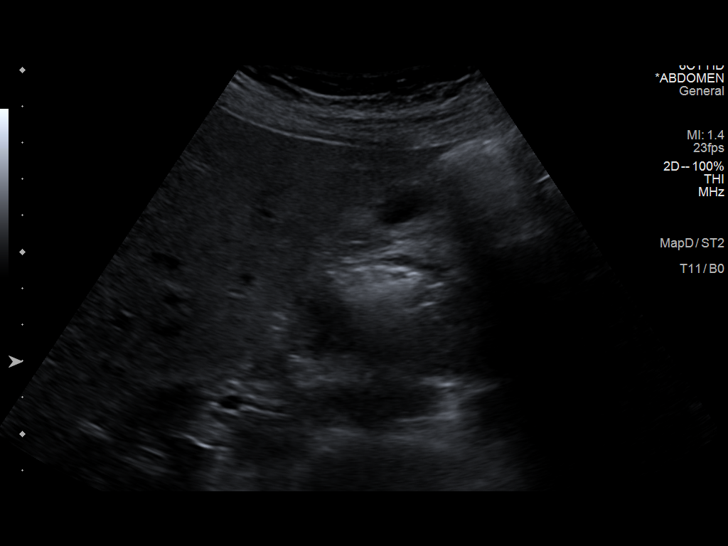
[im 39/59]
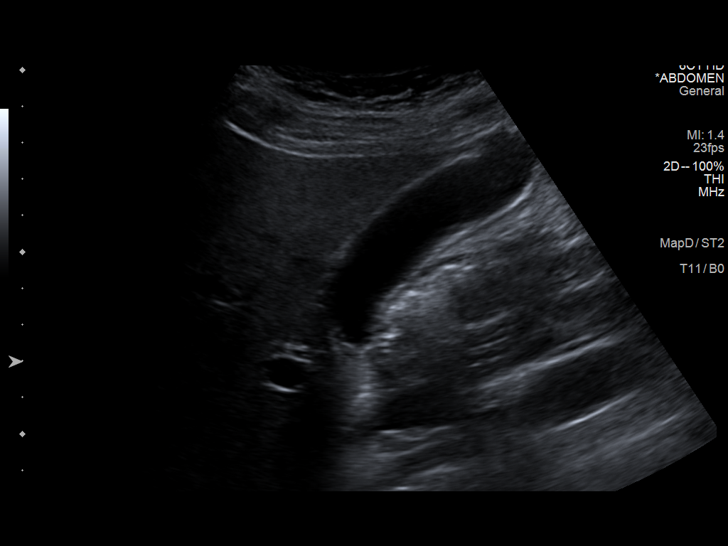
[im 44/59]
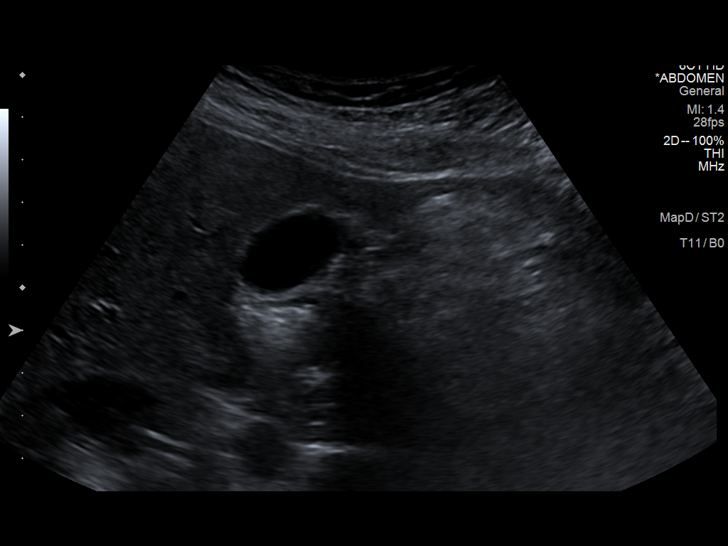
[im 49/59]
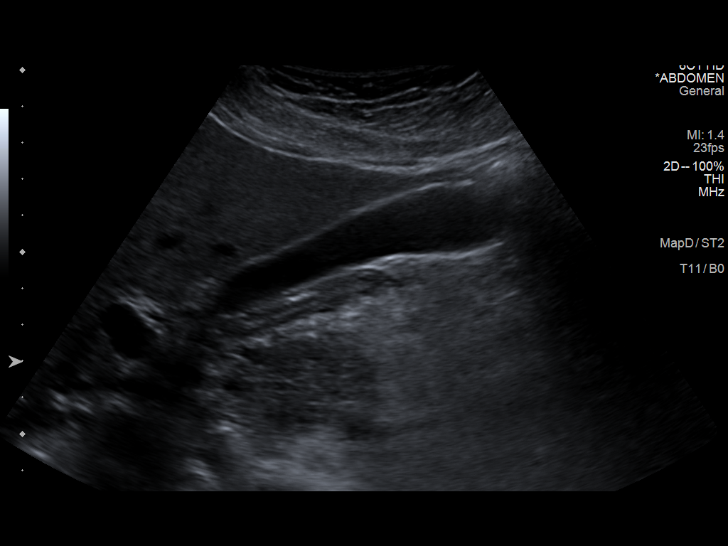
[im 54/59]
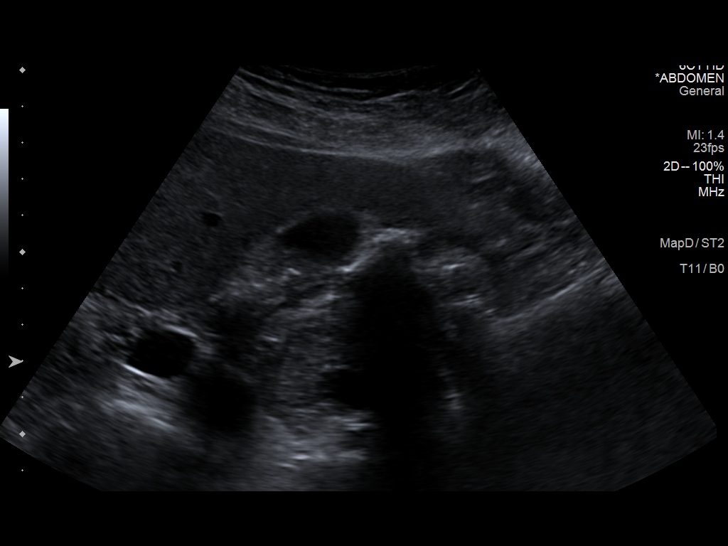
[im 59/59]
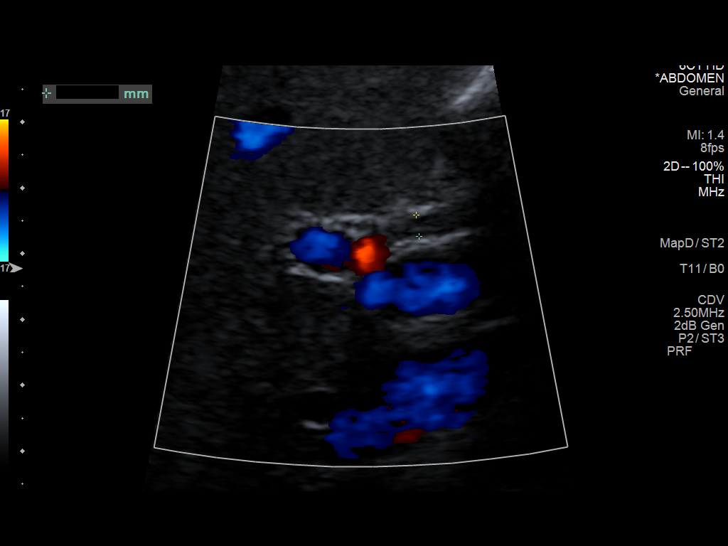

[14 of 25 positions shown; findings below may reference images not displayed]

FINDINGS: Gallbladder:

No gallstones or wall thickening visualized. No sonographic Murphy
sign noted by sonographer.

Common bile duct:

Diameter: 3.3 mm.

Liver:

Diffusely increased in echogenicity without focal mass. These
changes are consistent with fatty infiltration. Portal vein is
patent on color Doppler imaging with normal direction of blood flow
towards the liver.

Other: None.
IMPRESSION: Fatty infiltration of the liver.  No acute abnormality noted.

## 2022-05-03 ENCOUNTER — Other Ambulatory Visit (HOSPITAL_BASED_OUTPATIENT_CLINIC_OR_DEPARTMENT_OTHER): Payer: Self-pay

## 2022-05-03 ENCOUNTER — Other Ambulatory Visit: Payer: Self-pay | Admitting: Family

## 2022-05-03 MED ORDER — ALPRAZOLAM 0.25 MG PO TABS
0.2500 mg | ORAL_TABLET | Freq: Two times a day (BID) | ORAL | 0 refills | Status: DC | PRN
Start: 2022-05-03 — End: 2022-07-19
  Filled 2022-05-03 – 2022-05-31 (×2): qty 30, 15d supply, fill #0

## 2022-05-13 ENCOUNTER — Other Ambulatory Visit (HOSPITAL_BASED_OUTPATIENT_CLINIC_OR_DEPARTMENT_OTHER): Payer: Self-pay

## 2022-05-21 ENCOUNTER — Other Ambulatory Visit (HOSPITAL_BASED_OUTPATIENT_CLINIC_OR_DEPARTMENT_OTHER): Payer: Self-pay

## 2022-05-31 ENCOUNTER — Other Ambulatory Visit (HOSPITAL_BASED_OUTPATIENT_CLINIC_OR_DEPARTMENT_OTHER): Payer: Self-pay

## 2022-06-18 ENCOUNTER — Telehealth: Payer: Self-pay | Admitting: Family

## 2022-06-18 DIAGNOSIS — Z0279 Encounter for issue of other medical certificate: Secondary | ICD-10-CM

## 2022-06-18 NOTE — Telephone Encounter (Signed)
2 FMLA f,forms received, 1 for anxiety and one for pancreatitis. Please advise if appointment neededc

## 2022-06-18 NOTE — Telephone Encounter (Signed)
Patient stopped by the office to drop off FMLA paperwork. Advised patient that appointment are sometimes needed, to which the patient stated an OV was not needed last time. Forms places on provider's box.

## 2022-07-19 ENCOUNTER — Other Ambulatory Visit (HOSPITAL_BASED_OUTPATIENT_CLINIC_OR_DEPARTMENT_OTHER): Payer: Self-pay

## 2022-07-19 ENCOUNTER — Other Ambulatory Visit: Payer: Self-pay | Admitting: Family

## 2022-07-19 MED ORDER — ONDANSETRON 4 MG PO TBDP
4.0000 mg | ORAL_TABLET | Freq: Three times a day (TID) | ORAL | 0 refills | Status: DC | PRN
  Filled 2022-07-19: qty 20, 7d supply, fill #0

## 2022-07-19 MED ORDER — ALPRAZOLAM 0.25 MG PO TABS
0.2500 mg | ORAL_TABLET | Freq: Two times a day (BID) | ORAL | 0 refills | Status: DC | PRN
  Filled 2022-07-19: qty 30, 15d supply, fill #0

## 2022-07-25 ENCOUNTER — Telehealth: Payer: Self-pay | Admitting: Family

## 2022-07-25 DIAGNOSIS — Z0279 Encounter for issue of other medical certificate: Secondary | ICD-10-CM

## 2022-07-25 NOTE — Telephone Encounter (Signed)
Patient dropped forms off at office Placed in MO bin up front to be filled out

## 2022-07-26 NOTE — Telephone Encounter (Signed)
Form started and placed in provider's folder 

## 2022-08-02 NOTE — Telephone Encounter (Signed)
Forms given to me completed by provider. Forms faxed to [phone]. Original placed on Ruth's desk

## 2022-10-10 ENCOUNTER — Other Ambulatory Visit: Payer: Self-pay | Admitting: Family

## 2022-10-10 NOTE — Telephone Encounter (Signed)
Requesting: alprazolam 0.25mg   Contract:  11/25/19 UDS: 02/13/22 Last Visit:  02/13/22 Next Visit: None Last Refill: 07/19/22 #30 and 0RF   Please Advise

## 2022-10-11 ENCOUNTER — Other Ambulatory Visit: Payer: Self-pay | Admitting: Family

## 2022-10-11 ENCOUNTER — Other Ambulatory Visit (HOSPITAL_BASED_OUTPATIENT_CLINIC_OR_DEPARTMENT_OTHER): Payer: Self-pay

## 2022-10-11 MED ORDER — ONDANSETRON 4 MG PO TBDP
4.0000 mg | ORAL_TABLET | Freq: Three times a day (TID) | ORAL | 0 refills | Status: DC | PRN
  Filled 2022-10-11: qty 20, 7d supply, fill #0

## 2022-10-11 MED ORDER — OMEPRAZOLE 20 MG PO CPDR
20.0000 mg | DELAYED_RELEASE_CAPSULE | Freq: Every day | ORAL | 1 refills | Status: DC
  Filled 2022-10-11: qty 90, 90d supply, fill #0
  Filled 2022-12-31: qty 30, 30d supply, fill #1
  Filled 2022-12-31: qty 90, 90d supply, fill #1

## 2022-10-11 MED ORDER — ALPRAZOLAM 0.25 MG PO TABS
0.2500 mg | ORAL_TABLET | Freq: Two times a day (BID) | ORAL | 0 refills | Status: DC | PRN
  Filled 2022-10-11: qty 30, 15d supply, fill #0

## 2022-12-02 ENCOUNTER — Encounter: Payer: Self-pay | Admitting: Family

## 2022-12-03 ENCOUNTER — Telehealth (INDEPENDENT_AMBULATORY_CARE_PROVIDER_SITE_OTHER): Payer: BC Managed Care – PPO | Admitting: Family

## 2022-12-03 DIAGNOSIS — F419 Anxiety disorder, unspecified: Secondary | ICD-10-CM | POA: Diagnosis not present

## 2022-12-03 NOTE — Progress Notes (Signed)
MyChart Video Visit    Virtual Visit via Video Note   This visit type was conducted due to national recommendations for restrictions regarding the COVID-19 Pandemic (e.g. social distancing) in an effort to limit this patient's exposure and mitigate transmission in our community. This patient is at least at moderate risk for complications without adequate follow up. This format is felt to be most appropriate for this patient at this time. Physical exam was limited by quality of the video and audio technology used for the visit. CMA was able to get the patient set up on a video visit.  Patient location: Patient and provider in visit Provider location: Office  I discussed the limitations of evaluation and management by telemedicine and the availability of in person appointments. The patient expressed understanding and agreed to proceed.  Visit Date: 12/03/2022.  Today's healthcare provider: Nance Pear, NP     Subjective:    Patient ID: Joan Thomas, female    DOB: 1986-02-10, 37 y.o.   MRN: [id number]  Chief Complaint  Patient presents with   Anxiety    "Needs emotional support animal form fill out for dog"    HPI Patient is in today for a virtual office visit.  Stress/Anxiety: Since our last discussion she has moved to a new building. She was unable to move her American bulldog to another home, which has been increasing her stress. Her dog is UTD with its immunizations. She states that when her dog is with her, she does not need to take her Xanax as frequently.  Denies having any fever, new muscle pain, joint pain , new moles, congestion, sinus pain, sore throat, chest pain, palpations, cough, SOB ,wheezing,n/v/d constipation, blood in stool, dysuria, frequency, hematuria, at this time   Past Medical History:  Diagnosis Date   Anxiety    GERD (gastroesophageal reflux disease)    Pancreatitis    Vaginal Pap smear, abnormal     Past Surgical History:   Procedure Laterality Date   ECTOPIC PREGNANCY SURGERY     ESOPHAGEAL MANOMETRY N/A 07/28/2017   Procedure: ESOPHAGEAL MANOMETRY (EM);  Surgeon: Mauri Pole, MD;  Location: [facility];  Service: Endoscopy;  Laterality: N/A;   [place] IMPEDANCE STUDY N/A 07/28/2017   Procedure: [place] IMPEDANCE STUDY;  Surgeon: Mauri Pole, MD;  Location: [facility];  Service: Endoscopy;  Laterality: N/A;    Family History  Problem Relation Age of Onset   Hypertension Mother    Hypertension Sister        thrombocytosis   Arthritis Maternal Grandmother    GER disease Maternal Grandmother    Gout Maternal Grandmother    Osteoporosis Maternal Grandmother    Diabetes Maternal Grandfather    Heart disease Maternal Grandfather    Sleep apnea Maternal Grandfather    Kidney failure Paternal Grandmother    Breast cancer Paternal Grandmother    COPD Paternal Grandmother    Prostate cancer Paternal Grandfather        several types   Breast cancer Maternal Aunt    Prostate cancer Maternal Uncle    Alcohol abuse Neg Hx    Asthma Neg Hx    Birth defects Neg Hx    Depression Neg Hx    Drug abuse Neg Hx    Early death Neg Hx    Hearing loss Neg Hx    Hyperlipidemia Neg Hx    Kidney disease Neg Hx    Learning disabilities Neg Hx    Mental  illness Neg Hx    Mental retardation Neg Hx    Miscarriages / Stillbirths Neg Hx    Stroke Neg Hx    Vision loss Neg Hx    Esophageal cancer Neg Hx     Social History   Socioeconomic History   Marital status: Married    Spouse name: Not on file   Number of children: 0   Years of education: Not on file   Highest education level: Not on file  Occupational History   Not on file  Tobacco Use   Smoking status: Former    Packs/day: 0.50    Types: Cigarettes   Smokeless tobacco: Never   Tobacco comments:    None since 06/05/21  Vaping Use   Vaping Use: Former   Substances: CBD, Flavoring, Synthetic cannabinoids   Devices: hooka  Substance and Sexual  Activity   Alcohol use: Yes    Alcohol/week: 3.0 standard drinks of alcohol    Types: 3 Cans of beer per week    Comment: daily-none since dx pancreatitis   Drug use: No   Sexual activity: Yes    Partners: Male    Birth control/protection: None  Other Topics Concern   Not on file  Social History Narrative   Works at [employer] , completed an Public librarian.     Separated from her husband   No children    Has a dog   Family is out of town.    Enjoys cooking/reading.    Social Determinants of Health   Financial Resource Strain: Not on file  Food Insecurity: No Food Insecurity (05/04/2019)   Hunger Vital Sign    Worried About Running Out of Food in the Last Year: Never true    Ran Out of Food in the Last Year: Never true  Transportation Needs: No Transportation Needs (05/04/2019)   PRAPARE - Hydrologist (Medical): No    Lack of Transportation (Non-Medical): No  Physical Activity: Unknown (05/04/2019)   Exercise Vital Sign    Days of Exercise per Week: 0 days    Minutes of Exercise per Session: Not on file  Stress: Not on file  Social Connections: Moderately Integrated (05/04/2019)   Social Connection and Isolation Panel [NHANES]    Frequency of Communication with Friends and Family: More than three times a week    Frequency of Social Gatherings with Friends and Family: Twice a week    Attends Religious Services: More than 4 times per year    Active Member of [employer] or Organizations: No    Attends Archivist Meetings: Never    Marital Status: Married  Human resources officer Violence: Not At Risk (05/04/2019)   Humiliation, Afraid, Rape, and Kick questionnaire    Fear of Current or Ex-Partner: No    Emotionally Abused: No    Physically Abused: No    Sexually Abused: No    Outpatient Medications Prior to Visit  Medication Sig Dispense Refill   ALPRAZolam (XANAX) 0.25 MG tablet Take 1 tablet (0.25 mg total) by mouth 2 (two) times daily as  needed for anxiety. 30 tablet 0   loratadine (CLARITIN) 10 MG tablet Take 1 tablet (10 mg total) by mouth daily. 90 tablet 4   naproxen (NAPROSYN) 500 MG tablet Take 1 tablet (500 mg total) by mouth 2 (two) times daily with a meal. 60 tablet 0   omeprazole (PRILOSEC) 20 MG capsule Take 1 capsule (20 mg total) by mouth daily. [place]  capsule 1   ondansetron (ZOFRAN-ODT) 4 MG disintegrating tablet Take 1 tablet (4 mg total) by mouth every 8 (eight) hours as needed for nausea or vomiting. 20 tablet 0   No facility-administered medications prior to visit.    No Known Allergies  Review of Systems  Constitutional:  Negative for fever.  HENT:  Negative for congestion, sinus pain and sore throat.   Respiratory:  Negative for cough, shortness of breath and wheezing.   Cardiovascular:  Negative for chest pain and palpitations.  Gastrointestinal:  Negative for blood in stool, constipation, diarrhea, nausea and vomiting.  Genitourinary:  Negative for dysuria, frequency and hematuria.  Musculoskeletal:  Negative for joint pain and myalgias.       Objective:     There were no vitals taken for this visit. Wt Readings from Last 3 Encounters:  03/21/22 187 lb (84.8 kg)  03/21/22 187 lb (84.8 kg)  02/13/22 184 lb (83.5 kg)   Gen: Awake, alert, no acute distress. Resp: Breathing is even and non-labored. Psych: calm/pleasant demeanor. Neuro: Alert and Oriented x3, + facial symmetry, speech is clear.     Lab Results  Component Value Date   WBC 5.4 06/26/2021   HGB 12.1 06/26/2021   HCT 37.2 06/26/2021   PLT 381.0 06/26/2021   GLUCOSE 92 06/26/2021   CHOL 187 02/13/2022   TRIG 105.0 02/13/2022   HDL 94.70 02/13/2022   LDLCALC 71 02/13/2022   ALT 18 06/26/2021   ALT 18 06/26/2021   AST 17 06/26/2021   AST 17 06/26/2021   NA 138 06/26/2021   K 4.3 06/26/2021   CL 104 06/26/2021   CREATININE 0.71 06/26/2021   BUN 5 (L) 06/26/2021   CO2 26 06/26/2021   TSH 1.79 06/26/2021    Lab  Results  Component Value Date   TSH 1.79 06/26/2021   Lab Results  Component Value Date   WBC 5.4 06/26/2021   HGB 12.1 06/26/2021   HCT 37.2 06/26/2021   MCV 95.1 06/26/2021   PLT 381.0 06/26/2021   Lab Results  Component Value Date   NA 138 06/26/2021   K 4.3 06/26/2021   CO2 26 06/26/2021   GLUCOSE 92 06/26/2021   BUN 5 (L) 06/26/2021   CREATININE 0.71 06/26/2021   BILITOT 0.3 06/26/2021   BILITOT 0.3 06/26/2021   ALKPHOS 67 06/26/2021   ALKPHOS 67 06/26/2021   AST 17 06/26/2021   AST 17 06/26/2021   ALT 18 06/26/2021   ALT 18 06/26/2021   PROT 6.1 06/26/2021   PROT 6.1 06/26/2021   ALBUMIN 3.6 06/26/2021   ALBUMIN 3.6 06/26/2021   CALCIUM 9.3 06/26/2021   ANIONGAP 11 06/07/2021   GFR 109.62 06/26/2021   Lab Results  Component Value Date   CHOL 187 02/13/2022   Lab Results  Component Value Date   HDL 94.70 02/13/2022   Lab Results  Component Value Date   LDLCALC 71 02/13/2022   Lab Results  Component Value Date   TRIG 105.0 02/13/2022   Lab Results  Component Value Date   CHOLHDL 2 02/13/2022   No results found for: "HGBA1C"     Assessment & Plan:   Problem List Items Addressed This Visit   None    No orders of the defined types were placed in this encounter.   I discussed the assessment and treatment plan with the patient. The patient was provided an opportunity to ask questions and all were answered. The patient agreed with the plan and demonstrated  an understanding of the instructions.   The patient was advised to call back or seek an in-person evaluation if the symptoms worsen or if the condition fails to improve as anticipated.   I,Mathew Stumpf,acting as a Education administrator for [employer], NP.,have documented all relevant documentation on the behalf of Nance Pear, NP,as directed by  Nance Pear, NP while in the presence of Nance Pear, NP.   Nance Pear, NP [facility]  [place] (phone) [phone] (fax)  [place]

## 2022-12-04 NOTE — Assessment & Plan Note (Signed)
Notes improvement in her stress/anxiety symptoms and less need for xanax when she is able to keep her dog with her.  I provided a letter for her Landlord re: ESA animal necessity.

## 2022-12-05 ENCOUNTER — Telehealth: Payer: Self-pay | Admitting: Family

## 2022-12-05 NOTE — Telephone Encounter (Signed)
I called to get patient copay from 2/06 $40   You can transfer to me or Kennyth Lose

## 2022-12-06 ENCOUNTER — Encounter: Payer: Self-pay | Admitting: Family

## 2022-12-16 ENCOUNTER — Other Ambulatory Visit: Payer: Self-pay | Admitting: Family

## 2022-12-17 ENCOUNTER — Other Ambulatory Visit (HOSPITAL_BASED_OUTPATIENT_CLINIC_OR_DEPARTMENT_OTHER): Payer: Self-pay

## 2022-12-17 MED ORDER — ONDANSETRON 4 MG PO TBDP
4.0000 mg | ORAL_TABLET | Freq: Three times a day (TID) | ORAL | 0 refills | Status: DC | PRN
  Filled 2022-12-17: qty 20, 7d supply, fill #0

## 2022-12-17 MED ORDER — ALPRAZOLAM 0.25 MG PO TABS
0.2500 mg | ORAL_TABLET | Freq: Two times a day (BID) | ORAL | 0 refills | Status: DC | PRN
  Filled 2022-12-17: qty 30, 15d supply, fill #0

## 2022-12-31 ENCOUNTER — Other Ambulatory Visit (HOSPITAL_BASED_OUTPATIENT_CLINIC_OR_DEPARTMENT_OTHER): Payer: Self-pay

## 2023-01-07 ENCOUNTER — Encounter: Payer: Self-pay | Admitting: General Practice

## 2023-01-30 ENCOUNTER — Other Ambulatory Visit (HOSPITAL_BASED_OUTPATIENT_CLINIC_OR_DEPARTMENT_OTHER): Payer: Self-pay

## 2023-01-30 ENCOUNTER — Other Ambulatory Visit: Payer: Self-pay | Admitting: Family

## 2023-01-30 MED ORDER — ONDANSETRON 4 MG PO TBDP
4.0000 mg | ORAL_TABLET | Freq: Three times a day (TID) | ORAL | 0 refills | Status: DC | PRN
  Filled 2023-01-30: qty 12, 15d supply, fill #0
  Filled 2023-04-21: qty 12, 15d supply, fill #1

## 2023-01-30 MED ORDER — ALPRAZOLAM 0.25 MG PO TABS
0.2500 mg | ORAL_TABLET | Freq: Two times a day (BID) | ORAL | 0 refills | Status: DC | PRN
  Filled 2023-01-30: qty 30, 15d supply, fill #0

## 2023-02-24 ENCOUNTER — Encounter: Payer: BC Managed Care – PPO | Admitting: Family

## 2023-03-03 ENCOUNTER — Other Ambulatory Visit (HOSPITAL_BASED_OUTPATIENT_CLINIC_OR_DEPARTMENT_OTHER): Payer: Self-pay

## 2023-03-03 ENCOUNTER — Other Ambulatory Visit: Payer: Self-pay | Admitting: Family

## 2023-03-03 MED ORDER — OMEPRAZOLE 20 MG PO CPDR
20.0000 mg | DELAYED_RELEASE_CAPSULE | Freq: Every day | ORAL | 0 refills | Status: DC
  Filled 2023-03-03: qty 90, 90d supply, fill #0

## 2023-03-04 ENCOUNTER — Encounter: Payer: BC Managed Care – PPO | Admitting: Family

## 2023-03-10 ENCOUNTER — Other Ambulatory Visit (HOSPITAL_BASED_OUTPATIENT_CLINIC_OR_DEPARTMENT_OTHER): Payer: Self-pay

## 2023-03-13 ENCOUNTER — Other Ambulatory Visit (HOSPITAL_BASED_OUTPATIENT_CLINIC_OR_DEPARTMENT_OTHER): Payer: Self-pay

## 2023-03-18 ENCOUNTER — Encounter: Payer: BC Managed Care – PPO | Admitting: Family

## 2023-04-15 ENCOUNTER — Encounter: Payer: Self-pay | Admitting: Family

## 2023-04-15 ENCOUNTER — Ambulatory Visit (INDEPENDENT_AMBULATORY_CARE_PROVIDER_SITE_OTHER): Payer: BC Managed Care – PPO | Admitting: Family

## 2023-04-15 ENCOUNTER — Other Ambulatory Visit (HOSPITAL_BASED_OUTPATIENT_CLINIC_OR_DEPARTMENT_OTHER): Payer: Self-pay

## 2023-04-15 VITALS — BP 138/82 | HR 111 | Temp 98.0°F | Resp 16 | Ht 64.0 in | Wt 204.0 lb

## 2023-04-15 DIAGNOSIS — E785 Hyperlipidemia, unspecified: Secondary | ICD-10-CM

## 2023-04-15 DIAGNOSIS — F419 Anxiety disorder, unspecified: Secondary | ICD-10-CM | POA: Diagnosis not present

## 2023-04-15 DIAGNOSIS — R635 Abnormal weight gain: Secondary | ICD-10-CM

## 2023-04-15 DIAGNOSIS — K219 Gastro-esophageal reflux disease without esophagitis: Secondary | ICD-10-CM | POA: Diagnosis not present

## 2023-04-15 DIAGNOSIS — L709 Acne, unspecified: Secondary | ICD-10-CM | POA: Diagnosis not present

## 2023-04-15 DIAGNOSIS — Z0000 Encounter for general adult medical examination without abnormal findings: Secondary | ICD-10-CM

## 2023-04-15 MED ORDER — CLINDAMYCIN PHOS-BENZOYL PEROX 1-5 % EX GEL
1.0000 | Freq: Two times a day (BID) | CUTANEOUS | 0 refills | Status: DC
  Filled 2023-04-15: qty 25, 13d supply, fill #0

## 2023-04-15 MED ORDER — OMEPRAZOLE 40 MG PO CPDR
40.0000 mg | DELAYED_RELEASE_CAPSULE | Freq: Every day | ORAL | 1 refills | Status: DC
Start: 2023-04-15 — End: 2023-10-19
  Filled 2023-04-15: qty 30, 30d supply, fill #0
  Filled 2023-06-04: qty 30, 30d supply, fill #1
  Filled 2023-07-04 – 2023-07-10 (×3): qty 30, 30d supply, fill #2
  Filled 2023-07-24 – 2023-08-01 (×2): qty 90, 90d supply, fill #3
  Filled 2023-08-04: qty 30, 30d supply, fill #3
  Filled 2023-09-02: qty 30, 30d supply, fill #4
  Filled 2023-09-29: qty 30, 30d supply, fill #5

## 2023-04-15 NOTE — Assessment & Plan Note (Addendum)
Worsening acne, currently using over-the-counter treatments with variable success. -Prescribe topical Benzaclin gel to be used twice daily after washing face with a gentle cleanser.

## 2023-04-15 NOTE — Assessment & Plan Note (Signed)
-  Plan for COVID and flu booster in the fall. -Check cholesterol, kidney function, and thyroid function today. -Encourage regular dental and vision check-ups. -Continue use of condoms for birth control. -Continue use of Claritin for seasonal allergies. Pap up to date

## 2023-04-15 NOTE — Progress Notes (Signed)
Subjective:     Patient ID: Joan Thomas, female    DOB: June 18, 1986, 37 y.o.   MRN: [id number]  Chief Complaint  Patient presents with   Annual Exam    HPI  Discussed the use of AI scribe software for clinical note transcription with the patient, who gave verbal consent to proceed.  History of Present Illness   Joan Thomas, a patient with a history of trichomonas, anxiety, and reflux, presents for a physical update. She reports satisfaction with her new living situation, despite a minor issue with gnats. Her last Pap smear was a year ago, which was normal except for trichomonas that was treated. She is up to date on her tetanus shot and is advised to get a COVID and flu booster in the fall.  Joan Thomas expresses concern about her weight, attributing it to working from home for the past three years, lack of exercise, and constant snacking. She also reports irregular sleep patterns, staying up late watching movies and snacking, and sleeping during the day. She admits to consuming a lot of [employer] throughout the day.  She also mentions an increase in acne, which she has been managing with Clearasil and a serum from [place]. She reports that her ears produce a lot of fluid, which she finds irritating. She also mentions that her reflux has been worse, possibly due to her weight gain.  Joan Thomas also discusses her anxiety, which she manages with Xanax as needed. She reports that her anxiety has improved compared to the previous year.     Wt Readings from Last 3 Encounters:  04/15/23 204 lb (92.5 kg)  03/21/22 187 lb (84.8 kg)  03/21/22 187 lb (84.8 kg)     Health Maintenance Due  Topic Date Due   COVID-19 Vaccine (3 - 2023-24 season) 06/28/2022    Past Medical History:  Diagnosis Date   Anxiety    GERD (gastroesophageal reflux disease)    Pancreatitis    Pancreatitis, alcoholic, acute 06/11/2021   Vaginal Pap smear, abnormal     Past Surgical History:  Procedure Laterality  Date   ECTOPIC PREGNANCY SURGERY     ESOPHAGEAL MANOMETRY N/A 07/28/2017   Procedure: ESOPHAGEAL MANOMETRY (EM);  Surgeon: Napoleon Form, MD;  Location: [facility];  Service: Endoscopy;  Laterality: N/A;   PH IMPEDANCE STUDY N/A 07/28/2017   Procedure: PH IMPEDANCE STUDY;  Surgeon: Napoleon Form, MD;  Location: [facility];  Service: Endoscopy;  Laterality: N/A;    Family History  Problem Relation Age of Onset   Hypertension Mother    Multiple myeloma Father    Hypertension Sister        thrombocytosis   Arthritis Maternal Grandmother    GER disease Maternal Grandmother    Gout Maternal Grandmother    Osteoporosis Maternal Grandmother    Diabetes Maternal Grandfather    Heart disease Maternal Grandfather    Sleep apnea Maternal Grandfather    Kidney failure Paternal Grandmother    Breast cancer Paternal Grandmother    COPD Paternal Grandmother    Prostate cancer Paternal Grandfather        several types   Breast cancer Maternal Aunt    Prostate cancer Maternal Uncle    Alcohol abuse Neg Hx    Asthma Neg Hx    Birth defects Neg Hx    Depression Neg Hx    Drug abuse Neg Hx    Early death Neg Hx    Hearing loss Neg Hx  Hyperlipidemia Neg Hx    Kidney disease Neg Hx    Learning disabilities Neg Hx    Mental illness Neg Hx    Mental retardation Neg Hx    Miscarriages / Stillbirths Neg Hx    Stroke Neg Hx    Vision loss Neg Hx    Esophageal cancer Neg Hx     Social History   Socioeconomic History   Marital status: Married    Spouse name: Not on file   Number of children: 0   Years of education: Not on file   Highest education level: Not on file  Occupational History   Not on file  Tobacco Use   Smoking status: Former    Packs/day: .5    Types: Cigarettes   Smokeless tobacco: Never   Tobacco comments:    None since 06/05/21  Vaping Use   Vaping Use: Former   Substances: CBD, Flavoring, Synthetic cannabinoids   Devices: hooka  Substance and  Sexual Activity   Alcohol use: Not Currently    Comment: 1/5th on the weekend   Drug use: No   Sexual activity: Yes    Partners: Male    Birth control/protection: Condom  Other Topics Concern   Not on file  Social History Narrative   Works at [employer] , completed an Metallurgist.     Separated from her husband   No children    Has a dog   Family is out of town.    Enjoys cooking/reading.    Social Determinants of Health   Financial Resource Strain: Not on file  Food Insecurity: No Food Insecurity (05/04/2019)   Hunger Vital Sign    Worried About Running Out of Food in the Last Year: Never true    Ran Out of Food in the Last Year: Never true  Transportation Needs: No Transportation Needs (05/04/2019)   PRAPARE - Administrator, Civil Service (Medical): No    Lack of Transportation (Non-Medical): No  Physical Activity: Unknown (05/04/2019)   Exercise Vital Sign    Days of Exercise per Week: 0 days    Minutes of Exercise per Session: Not on file  Stress: Not on file  Social Connections: Moderately Integrated (05/04/2019)   Social Connection and Isolation Panel [NHANES]    Frequency of Communication with Friends and Family: More than three times a week    Frequency of Social Gatherings with Friends and Family: Twice a week    Attends Religious Services: More than 4 times per year    Active Member of [employer] or Organizations: No    Attends Banker Meetings: Never    Marital Status: Married  Catering manager Violence: Not At Risk (05/04/2019)   Humiliation, Afraid, Rape, and Kick questionnaire    Fear of Current or Ex-Partner: No    Emotionally Abused: No    Physically Abused: No    Sexually Abused: No    Outpatient Medications Prior to Visit  Medication Sig Dispense Refill   ALPRAZolam (XANAX) 0.25 MG tablet Take 1 tablet (0.25 mg total) by mouth 2 (two) times daily as needed for anxiety. 30 tablet 0   loratadine (CLARITIN) 10 MG tablet Take 1  tablet (10 mg total) by mouth daily. 90 tablet 4   naproxen (NAPROSYN) 500 MG tablet Take 1 tablet (500 mg total) by mouth 2 (two) times daily with a meal. 60 tablet 0   ondansetron (ZOFRAN-ODT) 4 MG disintegrating tablet Take 1 tablet (4 mg total)  by mouth every 8 (eight) hours as needed for nausea or vomiting. 20 tablet 0   omeprazole (PRILOSEC) 20 MG capsule Take 1 capsule (20 mg total) by mouth daily. 90 capsule 0   No facility-administered medications prior to visit.    No Known Allergies  Review of Systems  Constitutional:  Negative for weight loss.  HENT:  Negative for congestion.   Eyes:  Negative for blurred vision.  Respiratory:  Negative for cough.   Gastrointestinal:  Positive for heartburn.  Genitourinary:  Negative for dysuria.  Musculoskeletal:  Negative for joint pain and myalgias.  Skin:  Negative for rash.       Some acne  Neurological:  Negative for headaches.  Psychiatric/Behavioral:         Denies depression.  Anxiety is improved from last year       Objective:    Physical Exam   BP 138/82 (BP Location: Right Arm, Patient Position: Sitting, Cuff Size: Large)   Pulse (!) 111   Temp 98 F (36.7 C) (Oral)   Resp 16   Ht 5\' 4"  (1.626 m)   Wt 204 lb (92.5 kg)   SpO2 100%   BMI 35.02 kg/m  Wt Readings from Last 3 Encounters:  04/15/23 204 lb (92.5 kg)  03/21/22 187 lb (84.8 kg)  03/21/22 187 lb (84.8 kg)  Physical Exam  Constitutional: She is oriented to person, place, and time. She appears well-developed and well-nourished. No distress.  HENT:  Head: Normocephalic and atraumatic.  Right Ear: Tympanic membrane and ear canal normal.  Left Ear: Tympanic membrane and ear canal normal.  Mouth/Throat: Oropharynx is clear and moist.  Eyes: Pupils are equal, round, and reactive to light. No scleral icterus.  Neck: Normal range of motion. No thyromegaly present.  Cardiovascular: Normal rate and regular rhythm.   No murmur heard. Pulmonary/Chest: Effort  normal and breath sounds normal. No respiratory distress. He has no wheezes. She has no rales. She exhibits no tenderness.  Abdominal: Soft. Bowel sounds are normal. She exhibits no distension and no mass. There is no tenderness. There is no rebound and no guarding.  Musculoskeletal: She exhibits no edema.  Lymphadenopathy:    She has no cervical adenopathy.  Neurological: She is alert and oriented to person, place, and time. She has normal patellar reflexes. She exhibits normal muscle tone. Coordination normal.  Skin: Skin is warm and dry. Mild facial acne noted with some acne scarring  Psychiatric: She has a normal mood and affect. Her behavior is normal. Judgment and thought content normal.  Breast/pelvic: deferred           Assessment & Plan:        Assessment & Plan:   Problem List Items Addressed This Visit       Unprioritized   Weight gain    Recent weight gain and poor sleep hygiene with nocturnal snacking and inconsistent sleep schedule. Discussed the importance of regular sleep schedule, diet modification, and exercise. -Set a regular bedtime to ensure 7-8 hours of sleep. -Replace snacking with regular meals and increase water intake. -Cut off caffeine intake by 12pm. -Implement a regular exercise routine, aiming for 30 minutes a day, 5 days a week.      Relevant Orders   TSH   Preventative health care - Primary     -Plan for COVID and flu booster in the fall. -Check cholesterol, kidney function, and thyroid function today. -Encourage regular dental and vision check-ups. -Continue use of condoms for  birth control. -Continue use of Claritin for seasonal allergies. Pap up to date      Relevant Orders   Comp Met (CMET)   Gastroesophageal reflux disease    Symptoms have worsened with recent weight gain. Discussed dietary modification.      Relevant Medications   omeprazole (PRILOSEC) 40 MG capsule   Anxiety    Updated UDS/contract. Continue xanax prn.        Relevant Orders   DRUG MONITORING, PANEL 8 WITH CONFIRMATION, URINE   Acne    Worsening acne, currently using over-the-counter treatments with variable success. -Prescribe topical Benzaclin gel to be used twice daily after washing face with a gentle cleanser.       Relevant Medications   clindamycin-benzoyl peroxide (BENZACLIN) gel   Other Visit Diagnoses     Hyperlipidemia, unspecified hyperlipidemia type       Relevant Orders   Lipid panel       I have discontinued [name] "Ta-Ma-Ra"'s omeprazole. I am also having her start on clindamycin-benzoyl peroxide and omeprazole. Additionally, I am having her maintain her loratadine, naproxen, ondansetron, and ALPRAZolam.  Meds ordered this encounter  Medications   clindamycin-benzoyl peroxide (BENZACLIN) gel    Sig: Apply topically 2 (two) times daily.    Dispense:  25 g    Refill:  0    Order Specific Question:   Supervising Provider    Answer:   Danise Edge A [[id number]]   omeprazole (PRILOSEC) 40 MG capsule    Sig: Take 1 capsule (40 mg total) by mouth daily.    Dispense:  90 capsule    Refill:  1    Order Specific Question:   Supervising Provider    Answer:   Danise Edge A [[id number]]

## 2023-04-15 NOTE — Assessment & Plan Note (Signed)
Symptoms have worsened with recent weight gain. Discussed dietary modification.

## 2023-04-15 NOTE — Assessment & Plan Note (Signed)
Recent weight gain and poor sleep hygiene with nocturnal snacking and inconsistent sleep schedule. Discussed the importance of regular sleep schedule, diet modification, and exercise. -Set a regular bedtime to ensure 7-8 hours of sleep. -Replace snacking with regular meals and increase water intake. -Cut off caffeine intake by 12pm. -Implement a regular exercise routine, aiming for 30 minutes a day, 5 days a week.

## 2023-04-15 NOTE — Assessment & Plan Note (Signed)
Updated UDS/contract. Continue xanax prn.

## 2023-04-15 NOTE — Patient Instructions (Signed)
VISIT SUMMARY:  During your visit, we discussed your concerns about weight gain, poor sleep habits, worsening acne, increased reflux symptoms, and anxiety. We also reviewed your general health maintenance.  YOUR PLAN:  -WEIGHT GAIN AND POOR SLEEP HYGIENE: You've gained weight and have irregular sleep patterns. It's important to have a regular sleep schedule, eat balanced meals, drink more water, limit caffeine, and exercise regularly.  -ACNE: Your acne has worsened. I've prescribed a benzoyl peroxide gel to use twice daily after washing your face with a gentle cleanser.  -GASTROESOPHAGEAL REFLUX DISEASE (GERD): Your reflux, or heartburn, has worsened. I've increased your omeprazole dosage and discussed how weight loss and diet changes can help.  -ANXIETY: Your anxiety has improved compared to last year. Continue using Xanax as needed and monitor how reducing caffeine affects your anxiety.  -GENERAL HEALTH MAINTENANCE: Plan to get a COVID and flu booster in the fall. We'll check your cholesterol, kidney, and thyroid function. Regular dental and vision check-ups are important. Continue using condoms for birth control and Claritin for seasonal allergies.  INSTRUCTIONS:  Remember to set a regular bedtime, replace snacking with regular meals, increase water intake, cut off caffeine intake by 12pm, and exercise regularly. Use the prescribed benzoyl peroxide gel for your acne. Take the increased dosage of omeprazole for your reflux. Continue using Xanax for your anxiety as needed. Plan for your COVID and flu booster in the fall, and remember to get regular dental and vision check-ups. Continue using condoms for birth control and Claritin for seasonal allergies.

## 2023-04-16 ENCOUNTER — Encounter: Payer: Self-pay | Admitting: Family

## 2023-04-16 LAB — COMPREHENSIVE METABOLIC PANEL
ALT: 28 U/L (ref 0–35)
AST: 31 U/L (ref 0–37)
Albumin: 4 g/dL (ref 3.5–5.2)
Alkaline Phosphatase: 69 U/L (ref 39–117)
BUN: 11 mg/dL (ref 6–23)
CO2: 26 mEq/L (ref 19–32)
Calcium: 9.4 mg/dL (ref 8.4–10.5)
Chloride: 100 mEq/L (ref 96–112)
Creatinine, Ser: 0.91 mg/dL (ref 0.40–1.20)
GFR: 80.36 mL/min (ref >60.00)
Glucose, Bld: 92 mg/dL (ref 70–99)
Potassium: 4.2 mEq/L (ref 3.5–5.1)
Sodium: 135 mEq/L (ref 135–145)
Total Bilirubin: 0.5 mg/dL (ref 0.2–1.2)
Total Protein: 7.3 g/dL (ref 6.0–8.3)

## 2023-04-16 LAB — LIPID PANEL
Cholesterol: 170 mg/dL (ref 0–200)
HDL: 75.8 mg/dL (ref >39.00)
LDL Cholesterol: 62 mg/dL (ref 0–99)
NonHDL: 94.01
Total CHOL/HDL Ratio: 2
Triglycerides: 162 mg/dL — ABNORMAL HIGH (ref 0.0–149.0)
VLDL: 32.4 mg/dL (ref 0.0–40.0)

## 2023-04-16 LAB — TSH: TSH: 1.68 u[IU]/mL (ref 0.35–5.50)

## 2023-04-17 LAB — DRUG MONITORING, PANEL 8 WITH CONFIRMATION, URINE
6 Acetylmorphine: NEGATIVE ng/mL (ref <10)
Alcohol Metabolites: POSITIVE ng/mL — AB (ref <500)
Amphetamines: NEGATIVE ng/mL (ref <500)
Benzodiazepines: NEGATIVE ng/mL (ref <100)
Buprenorphine, Urine: NEGATIVE ng/mL (ref <5)
Cocaine Metabolite: NEGATIVE ng/mL (ref <150)
Creatinine: 237.3 mg/dL (ref >=20.0)
Ethyl Glucuronide (ETG): 14469 ng/mL — ABNORMAL HIGH (ref <500)
Ethyl Sulfate (ETS): 6135 ng/mL — ABNORMAL HIGH (ref <100)
MDMA: NEGATIVE ng/mL (ref <500)
Marijuana Metabolite: NEGATIVE ng/mL (ref <20)
Opiates: NEGATIVE ng/mL (ref <100)
Oxidant: NEGATIVE ug/mL (ref <200)
Oxycodone: NEGATIVE ng/mL (ref <100)
pH: 5.2 (ref 4.5–9.0)

## 2023-04-17 LAB — DM TEMPLATE

## 2023-04-18 ENCOUNTER — Other Ambulatory Visit (HOSPITAL_BASED_OUTPATIENT_CLINIC_OR_DEPARTMENT_OTHER): Payer: Self-pay

## 2023-04-18 ENCOUNTER — Telehealth: Payer: Self-pay

## 2023-04-18 ENCOUNTER — Encounter: Payer: Self-pay | Admitting: Family

## 2023-04-18 MED ORDER — ZEPBOUND 2.5 MG/0.5ML ~~LOC~~ SOAJ
2.5000 mg | SUBCUTANEOUS | 0 refills | Status: DC
  Filled 2023-04-18: qty 2, 28d supply, fill #0

## 2023-04-18 NOTE — Telephone Encounter (Signed)
PA approved. Effective 04/18/2023 to 12/19/2023

## 2023-04-18 NOTE — Telephone Encounter (Signed)
PA initiated via Covermymeds; KEY: [id number]. Awaiting determination.

## 2023-04-21 ENCOUNTER — Other Ambulatory Visit: Payer: Self-pay | Admitting: Family

## 2023-04-22 ENCOUNTER — Other Ambulatory Visit: Payer: Self-pay | Admitting: Family

## 2023-04-22 ENCOUNTER — Other Ambulatory Visit (HOSPITAL_BASED_OUTPATIENT_CLINIC_OR_DEPARTMENT_OTHER): Payer: Self-pay

## 2023-04-23 ENCOUNTER — Other Ambulatory Visit (HOSPITAL_BASED_OUTPATIENT_CLINIC_OR_DEPARTMENT_OTHER): Payer: Self-pay

## 2023-04-25 ENCOUNTER — Other Ambulatory Visit: Payer: Self-pay | Admitting: Family

## 2023-04-25 NOTE — Telephone Encounter (Signed)
Medication: ondansetron (ZOFRAN-ODT) 4 MG disintegrating tablet  ALPRAZolam (XANAX) 0.25 MG tablet  Has the patient contacted their pharmacy? Yes.    Preferred Pharmacy:    [facility] [place] [facility] [address] [place] [address] Phone: [phone]  Fax: [phone]

## 2023-04-28 NOTE — Telephone Encounter (Signed)
Requesting: alprazolam 0.25mg   Contract: 02/22/22 UDS: 04/15/23 Last Visit: 04/15/23 Next Visit: None Last Refill: 01/30/23 #30 and 0RF   Please Advise

## 2023-04-29 ENCOUNTER — Telehealth: Payer: Self-pay | Admitting: Family

## 2023-04-29 ENCOUNTER — Other Ambulatory Visit (HOSPITAL_BASED_OUTPATIENT_CLINIC_OR_DEPARTMENT_OTHER): Payer: Self-pay

## 2023-04-29 ENCOUNTER — Other Ambulatory Visit: Payer: Self-pay

## 2023-04-29 MED ORDER — ONDANSETRON 4 MG PO TBDP
4.0000 mg | ORAL_TABLET | Freq: Three times a day (TID) | ORAL | 0 refills | Status: DC | PRN
  Filled 2023-04-29: qty 12, 15d supply, fill #0
  Filled 2023-06-04: qty 8, 3d supply, fill #1

## 2023-04-29 MED ORDER — ALPRAZOLAM 0.25 MG PO TABS
0.2500 mg | ORAL_TABLET | Freq: Two times a day (BID) | ORAL | 0 refills | Status: DC | PRN
  Filled 2023-04-29: qty 30, 15d supply, fill #0

## 2023-04-29 NOTE — Telephone Encounter (Signed)
See below. Was sent to PCP yesterday.

## 2023-04-29 NOTE — Telephone Encounter (Signed)
Patient called to follow up on the prescription request from last week. She is leaving to go out of town this evening and needs her medication before she goes. Please review request for :   ondansetron (ZOFRAN-ODT) 4 MG disintegrating tablet   ALPRAZolam (XANAX) 0.25 MG tablet [[id number]]   Unable to add this to the previous message.

## 2023-04-29 NOTE — Telephone Encounter (Signed)
You routed this conversation to Sandford Craze, NP Me      04/28/23  1:50 PM Note Requesting: alprazolam 0.25mg   Contract: 02/22/22 UDS: 04/15/23 Last Visit: 04/15/23 Next Visit: None Last Refill: 01/30/23 #30 and 0RF    Please Advise      April 25, 2023     [place]   04/25/23  2:57 PM Erskin Burnet B routed this conversation to Wilford Corner, CMA    Wynona Neat   04/25/23  2:57 PM Note Medication: ondansetron (ZOFRAN-ODT) 4 MG disintegrating tablet   ALPRAZolam (XANAX) 0.25 MG tablet   Has the patient contacted their pharmacy? Yes.     Preferred Pharmacy:      [facility] [place] - [facility] [address], [address] [place] [address] Phone: [phone]  Fax: [phone]

## 2023-06-04 ENCOUNTER — Other Ambulatory Visit: Payer: Self-pay | Admitting: Family

## 2023-06-04 ENCOUNTER — Other Ambulatory Visit: Payer: Self-pay

## 2023-06-04 ENCOUNTER — Other Ambulatory Visit (HOSPITAL_BASED_OUTPATIENT_CLINIC_OR_DEPARTMENT_OTHER): Payer: Self-pay

## 2023-06-05 ENCOUNTER — Other Ambulatory Visit (HOSPITAL_COMMUNITY): Payer: Self-pay

## 2023-06-05 ENCOUNTER — Other Ambulatory Visit (HOSPITAL_BASED_OUTPATIENT_CLINIC_OR_DEPARTMENT_OTHER): Payer: Self-pay

## 2023-06-05 MED ORDER — ALPRAZOLAM 0.25 MG PO TABS
0.2500 mg | ORAL_TABLET | Freq: Two times a day (BID) | ORAL | 0 refills | Status: DC | PRN
  Filled 2023-06-05: qty 30, 15d supply, fill #0

## 2023-06-06 ENCOUNTER — Other Ambulatory Visit (HOSPITAL_BASED_OUTPATIENT_CLINIC_OR_DEPARTMENT_OTHER): Payer: Self-pay

## 2023-06-09 ENCOUNTER — Other Ambulatory Visit (HOSPITAL_BASED_OUTPATIENT_CLINIC_OR_DEPARTMENT_OTHER): Payer: Self-pay

## 2023-06-09 ENCOUNTER — Other Ambulatory Visit: Payer: Self-pay | Admitting: Family

## 2023-06-10 ENCOUNTER — Other Ambulatory Visit (HOSPITAL_BASED_OUTPATIENT_CLINIC_OR_DEPARTMENT_OTHER): Payer: Self-pay

## 2023-06-11 ENCOUNTER — Other Ambulatory Visit (HOSPITAL_BASED_OUTPATIENT_CLINIC_OR_DEPARTMENT_OTHER): Payer: Self-pay

## 2023-06-11 MED ORDER — NAPROXEN 500 MG PO TABS
500.0000 mg | ORAL_TABLET | Freq: Two times a day (BID) | ORAL | 0 refills | Status: DC | PRN
  Filled 2023-06-11: qty 30, 15d supply, fill #0

## 2023-06-11 NOTE — Addendum Note (Signed)
Addended by: Sandford Craze on: 06/11/2023 01:04 PM   Modules accepted: Orders

## 2023-06-20 ENCOUNTER — Telehealth: Payer: Self-pay | Admitting: Family

## 2023-06-20 DIAGNOSIS — Z0279 Encounter for issue of other medical certificate: Secondary | ICD-10-CM

## 2023-06-20 NOTE — Telephone Encounter (Signed)
Pt dropped off document to be filled out by provider [facility] request) Pt would like document to be faxed when ready at [phone]. Document put at front office tray under providers name.

## 2023-06-23 NOTE — Telephone Encounter (Signed)
Form started and placed in provider's folder to be completed

## 2023-07-04 ENCOUNTER — Encounter: Payer: Self-pay | Admitting: Pharmacist

## 2023-07-04 ENCOUNTER — Other Ambulatory Visit: Payer: Self-pay

## 2023-07-09 ENCOUNTER — Other Ambulatory Visit: Payer: Self-pay

## 2023-07-10 ENCOUNTER — Other Ambulatory Visit (HOSPITAL_BASED_OUTPATIENT_CLINIC_OR_DEPARTMENT_OTHER): Payer: Self-pay

## 2023-07-10 ENCOUNTER — Other Ambulatory Visit (HOSPITAL_COMMUNITY): Payer: Self-pay

## 2023-07-15 ENCOUNTER — Other Ambulatory Visit (HOSPITAL_COMMUNITY): Payer: Self-pay

## 2023-07-23 ENCOUNTER — Other Ambulatory Visit: Payer: Self-pay | Admitting: Family

## 2023-07-23 ENCOUNTER — Encounter: Payer: Self-pay | Admitting: Family

## 2023-07-24 ENCOUNTER — Other Ambulatory Visit (HOSPITAL_BASED_OUTPATIENT_CLINIC_OR_DEPARTMENT_OTHER): Payer: Self-pay

## 2023-07-24 MED ORDER — NAPROXEN 500 MG PO TABS
500.0000 mg | ORAL_TABLET | Freq: Two times a day (BID) | ORAL | 0 refills | Status: DC | PRN
  Filled 2023-07-24: qty 30, 15d supply, fill #0

## 2023-07-24 MED ORDER — ONDANSETRON 4 MG PO TBDP
4.0000 mg | ORAL_TABLET | Freq: Three times a day (TID) | ORAL | 0 refills | Status: AC | PRN
Start: 2023-07-24 — End: ?
  Filled 2023-07-24: qty 12, 15d supply, fill #0

## 2023-07-27 MED ORDER — ONDANSETRON 4 MG PO TBDP: 4.0000 mg | ORAL_TABLET | Freq: Three times a day (TID) | ORAL | 0 refills | Status: DC | PRN

## 2023-07-27 MED ORDER — NAPROXEN 500 MG PO TABS: 500.0000 mg | ORAL_TABLET | Freq: Two times a day (BID) | ORAL | 0 refills | Status: DC | PRN

## 2023-07-28 ENCOUNTER — Other Ambulatory Visit (HOSPITAL_BASED_OUTPATIENT_CLINIC_OR_DEPARTMENT_OTHER): Payer: Self-pay

## 2023-07-29 ENCOUNTER — Telehealth: Payer: Self-pay | Admitting: Family

## 2023-07-29 NOTE — Telephone Encounter (Signed)
Pt came in and stated that she needs her FMLA paperwork to be signed and completed by pcp. Form/paperwork has been placed in pcp box. Please call pt when ready for pick-up.

## 2023-07-30 NOTE — Telephone Encounter (Signed)
Form received and started. Put in provider's folder. The soonest form will be completed will be Friday 08/01/23

## 2023-08-01 ENCOUNTER — Other Ambulatory Visit (HOSPITAL_BASED_OUTPATIENT_CLINIC_OR_DEPARTMENT_OTHER): Payer: Self-pay

## 2023-08-04 DIAGNOSIS — Z0279 Encounter for issue of other medical certificate: Secondary | ICD-10-CM

## 2023-08-05 ENCOUNTER — Other Ambulatory Visit (HOSPITAL_BASED_OUTPATIENT_CLINIC_OR_DEPARTMENT_OTHER): Payer: Self-pay

## 2023-09-02 ENCOUNTER — Other Ambulatory Visit: Payer: Self-pay | Admitting: Family

## 2023-09-02 ENCOUNTER — Other Ambulatory Visit (HOSPITAL_BASED_OUTPATIENT_CLINIC_OR_DEPARTMENT_OTHER): Payer: Self-pay

## 2023-09-02 MED ORDER — ONDANSETRON 4 MG PO TBDP
4.0000 mg | ORAL_TABLET | Freq: Three times a day (TID) | ORAL | 0 refills | Status: DC | PRN
  Filled 2023-09-02: qty 12, 4d supply, fill #0
  Filled 2023-10-19: qty 8, 3d supply, fill #1

## 2023-09-02 MED ORDER — NAPROXEN 500 MG PO TABS
500.0000 mg | ORAL_TABLET | Freq: Two times a day (BID) | ORAL | 0 refills | Status: DC | PRN
  Filled 2023-09-02: qty 30, 15d supply, fill #0

## 2023-09-02 NOTE — Telephone Encounter (Signed)
Requesting: alprazolam 0.25mg  Contract: 04/15/23 UDS: 04/15/23 Last Visit:  04/15/23 Next Visit: None Last Refill: 06/05/23 #30 and 0RF   Please Advise

## 2023-09-03 ENCOUNTER — Other Ambulatory Visit (HOSPITAL_BASED_OUTPATIENT_CLINIC_OR_DEPARTMENT_OTHER): Payer: Self-pay

## 2023-09-03 ENCOUNTER — Other Ambulatory Visit: Payer: Self-pay

## 2023-09-03 MED ORDER — ALPRAZOLAM 0.25 MG PO TABS
0.2500 mg | ORAL_TABLET | Freq: Two times a day (BID) | ORAL | 0 refills | Status: DC | PRN
  Filled 2023-09-03: qty 30, 15d supply, fill #0

## 2023-09-30 ENCOUNTER — Other Ambulatory Visit (HOSPITAL_BASED_OUTPATIENT_CLINIC_OR_DEPARTMENT_OTHER): Payer: Self-pay

## 2023-10-19 ENCOUNTER — Other Ambulatory Visit: Payer: Self-pay | Admitting: Family

## 2023-10-19 DIAGNOSIS — K219 Gastro-esophageal reflux disease without esophagitis: Secondary | ICD-10-CM

## 2023-10-20 ENCOUNTER — Other Ambulatory Visit: Payer: Self-pay

## 2023-10-20 ENCOUNTER — Other Ambulatory Visit (HOSPITAL_BASED_OUTPATIENT_CLINIC_OR_DEPARTMENT_OTHER): Payer: Self-pay

## 2023-10-20 MED ORDER — NAPROXEN 500 MG PO TABS
500.0000 mg | ORAL_TABLET | Freq: Two times a day (BID) | ORAL | 0 refills | Status: DC | PRN
  Filled 2023-10-20: qty 30, 15d supply, fill #0

## 2023-10-20 MED ORDER — ALPRAZOLAM 0.25 MG PO TABS
0.2500 mg | ORAL_TABLET | Freq: Two times a day (BID) | ORAL | 0 refills | Status: DC | PRN
  Filled 2023-10-20: qty 30, 15d supply, fill #0

## 2023-10-20 MED ORDER — OMEPRAZOLE 40 MG PO CPDR
40.0000 mg | DELAYED_RELEASE_CAPSULE | Freq: Every day | ORAL | 1 refills | Status: DC
Start: 2023-10-20 — End: 2024-02-22
  Filled 2023-10-20: qty 90, 90d supply, fill #0
  Filled 2023-12-19: qty 90, 90d supply, fill #1

## 2023-12-19 ENCOUNTER — Other Ambulatory Visit: Payer: Self-pay | Admitting: Family

## 2023-12-19 ENCOUNTER — Other Ambulatory Visit (HOSPITAL_BASED_OUTPATIENT_CLINIC_OR_DEPARTMENT_OTHER): Payer: Self-pay

## 2023-12-19 ENCOUNTER — Other Ambulatory Visit: Payer: Self-pay

## 2023-12-19 MED ORDER — ALPRAZOLAM 0.25 MG PO TABS
0.2500 mg | ORAL_TABLET | Freq: Two times a day (BID) | ORAL | 0 refills | Status: DC | PRN
  Filled 2023-12-19: qty 30, 15d supply, fill #0

## 2023-12-19 MED ORDER — NAPROXEN 500 MG PO TABS
500.0000 mg | ORAL_TABLET | Freq: Two times a day (BID) | ORAL | 0 refills | Status: DC | PRN
  Filled 2023-12-19: qty 30, 15d supply, fill #0

## 2023-12-19 MED ORDER — ONDANSETRON 4 MG PO TBDP
4.0000 mg | ORAL_TABLET | Freq: Three times a day (TID) | ORAL | 0 refills | Status: DC | PRN
  Filled 2023-12-19: qty 20, 7d supply, fill #0

## 2023-12-19 NOTE — Telephone Encounter (Signed)
Requesting: alprazolam 0.25mg   Contract: 04/15/23 UDS: 04/15/23  Last Visit: 04/15/23 Next Visit: None Last Refill:  10/20/23 #30 and 0RF   Please Advise

## 2023-12-21 ENCOUNTER — Other Ambulatory Visit: Payer: Self-pay

## 2023-12-23 ENCOUNTER — Other Ambulatory Visit (HOSPITAL_BASED_OUTPATIENT_CLINIC_OR_DEPARTMENT_OTHER): Payer: Self-pay

## 2023-12-24 ENCOUNTER — Other Ambulatory Visit (HOSPITAL_COMMUNITY): Payer: Self-pay

## 2023-12-24 ENCOUNTER — Telehealth: Payer: Self-pay | Admitting: Pharmacy Technician

## 2023-12-24 NOTE — Telephone Encounter (Signed)
 Pharmacy Patient Advocate Encounter   Received notification from CoverMyMeds that prior authorization for ZEPBOUND 2.5MG /0.5ML PEN is required/requested.   Insurance verification completed.   The patient is insured through [facility] .   Per test claim:

## 2024-01-29 ENCOUNTER — Other Ambulatory Visit (HOSPITAL_BASED_OUTPATIENT_CLINIC_OR_DEPARTMENT_OTHER): Payer: Self-pay

## 2024-01-29 ENCOUNTER — Other Ambulatory Visit: Payer: Self-pay | Admitting: Family

## 2024-01-29 MED ORDER — ONDANSETRON 4 MG PO TBDP
4.0000 mg | ORAL_TABLET | Freq: Three times a day (TID) | ORAL | 0 refills | Status: DC | PRN
  Filled 2024-01-29: qty 20, 7d supply, fill #0

## 2024-01-29 MED ORDER — NAPROXEN 500 MG PO TABS
500.0000 mg | ORAL_TABLET | Freq: Two times a day (BID) | ORAL | 0 refills | Status: AC | PRN
  Filled 2024-01-29: qty 30, 15d supply, fill #0

## 2024-02-03 ENCOUNTER — Ambulatory Visit (INDEPENDENT_AMBULATORY_CARE_PROVIDER_SITE_OTHER): Admitting: Family

## 2024-02-03 VITALS — BP 151/81 | HR 78 | Temp 98.9°F | Resp 12 | Ht 64.0 in | Wt 216.0 lb

## 2024-02-03 DIAGNOSIS — I10 Essential (primary) hypertension: Secondary | ICD-10-CM | POA: Diagnosis not present

## 2024-02-03 DIAGNOSIS — F419 Anxiety disorder, unspecified: Secondary | ICD-10-CM

## 2024-02-03 DIAGNOSIS — E669 Obesity, unspecified: Secondary | ICD-10-CM | POA: Insufficient documentation

## 2024-02-03 DIAGNOSIS — L700 Acne vulgaris: Secondary | ICD-10-CM

## 2024-02-03 DIAGNOSIS — K219 Gastro-esophageal reflux disease without esophagitis: Secondary | ICD-10-CM

## 2024-02-03 DIAGNOSIS — B977 Papillomavirus as the cause of diseases classified elsewhere: Secondary | ICD-10-CM

## 2024-02-03 DIAGNOSIS — R109 Unspecified abdominal pain: Secondary | ICD-10-CM

## 2024-02-03 DIAGNOSIS — K860 Alcohol-induced chronic pancreatitis: Secondary | ICD-10-CM

## 2024-02-03 MED ORDER — AMLODIPINE BESYLATE 5 MG PO TABS
5.0000 mg | ORAL_TABLET | Freq: Every day | ORAL | 3 refills | Status: DC
  Filled 2024-02-03: qty 30, 30d supply, fill #0
  Filled 2024-02-29: qty 30, 30d supply, fill #1
  Filled 2024-04-01: qty 30, 30d supply, fill #2
  Filled 2024-04-28: qty 30, 30d supply, fill #3

## 2024-02-03 NOTE — Progress Notes (Unsigned)
 Subjective:     Patient ID: Joan Thomas, female    DOB: Mar 13, 1986, 38 y.o.   MRN: [id number]  Chief Complaint  Patient presents with   Follow-up    HPI  Discussed the use of AI scribe software for clinical note transcription with the patient, who gave verbal consent to proceed.  History of Present Illness   Joan Persing "Ta-Ma-Ra" is a 38 year old female who presents for a follow-up on medications and weight management.  She has experienced weight gain, increasing from 187 lbs in 2023 to 216 lbs currently. She feels tired since returning to work and attributes some of the weight gain to lack of exercise and dietary habits. She previously tried tirzepatide but discontinued it after the first dose due to severe sickness.  She is not currently engaging in any exercise but plans to start walking and using a stationary bike at home. Her diet typically lacks breakfast, but when she does eat, she prefers boiled or scrambled eggs with [place] bacon. She drinks water during the day but consumes Coca-Cola and other sugary beverages in the evenings and on weekends. She drinks approximately two 16-ounce glasses of Coca-Cola daily, and more on weekends, along with other beverages like [place] lemonade tea and Tropicana juice.  She experiences nausea and reflux, for which she takes omeprazole. She has a history of pancreatitis and reports occasional stomach pain that she describes as similar to her pancreatitis pain. She has used naproxen for this pain, but it has been a month since her last episode. She occasionally drinks alcohol, noting that heavy drinking can exacerbate her stomach pain. She uses Zofran for nausea as needed.  She experiences anxiety, particularly when driving on highways, and uses alprazolam as needed, though she has not used it frequently. She also takes Claritin for allergies, which she finds helpful.  Her family history includes high blood pressure in her mother and  sister, with her sister having been on blood pressure medication previously.    There are no preventive care reminders to display for this patient.  Past Medical History:  Diagnosis Date   Anxiety    GERD (gastroesophageal reflux disease)    Pancreatitis    Pancreatitis, alcoholic, acute 06/11/2021   Vaginal Pap smear, abnormal     Past Surgical History:  Procedure Laterality Date   ECTOPIC PREGNANCY SURGERY     ESOPHAGEAL MANOMETRY N/A 07/28/2017   Procedure: ESOPHAGEAL MANOMETRY (EM);  Surgeon: Napoleon Form, MD;  Location: [facility];  Service: Endoscopy;  Laterality: N/A;   PH IMPEDANCE STUDY N/A 07/28/2017   Procedure: PH IMPEDANCE STUDY;  Surgeon: Napoleon Form, MD;  Location: [facility];  Service: Endoscopy;  Laterality: N/A;    Family History  Problem Relation Age of Onset   Hypertension Mother    Multiple myeloma Father    Hypertension Sister        thrombocytosis   Arthritis Maternal Grandmother    GER disease Maternal Grandmother    Gout Maternal Grandmother    Osteoporosis Maternal Grandmother    Diabetes Maternal Grandfather    Heart disease Maternal Grandfather    Sleep apnea Maternal Grandfather    Kidney failure Paternal Grandmother    Breast cancer Paternal Grandmother    COPD Paternal Grandmother    Prostate cancer Paternal Grandfather        several types   Breast cancer Maternal Aunt    Prostate cancer Maternal Uncle    Alcohol abuse Neg Hx  Asthma Neg Hx    Birth defects Neg Hx    Depression Neg Hx    Drug abuse Neg Hx    Early death Neg Hx    Hearing loss Neg Hx    Hyperlipidemia Neg Hx    Kidney disease Neg Hx    Learning disabilities Neg Hx    Mental illness Neg Hx    Mental retardation Neg Hx    Miscarriages / Stillbirths Neg Hx    Stroke Neg Hx    Vision loss Neg Hx    Esophageal cancer Neg Hx     Social History   Socioeconomic History   Marital status: Married    Spouse name: Not on file   Number of  children: 0   Years of education: Not on file   Highest education level: Not on file  Occupational History   Not on file  Tobacco Use   Smoking status: Former    Current packs/day: 0.50    Types: Cigarettes   Smokeless tobacco: Never   Tobacco comments:    None since 06/05/21  Vaping Use   Vaping status: Former   Substances: CBD, Engineer, production, Synthetic cannabinoids   Devices: hooka  Substance and Sexual Activity   Alcohol use: Not Currently    Comment: 1/5th on the weekend   Drug use: No   Sexual activity: Yes    Partners: Male    Birth control/protection: Condom  Other Topics Concern   Not on file  Social History Narrative   Works at [employer] , completed an Metallurgist.     Separated from her husband   No children    Has a dog   Family is out of town.    Enjoys cooking/reading.    Social Drivers of Corporate investment banker Strain: Not on file  Food Insecurity: No Food Insecurity (05/04/2019)   Hunger Vital Sign    Worried About Running Out of Food in the Last Year: Never true    Ran Out of Food in the Last Year: Never true  Transportation Needs: No Transportation Needs (05/04/2019)   PRAPARE - Administrator, Civil Service (Medical): No    Lack of Transportation (Non-Medical): No  Physical Activity: Unknown (05/04/2019)   Exercise Vital Sign    Days of Exercise per Week: 0 days    Minutes of Exercise per Session: Not on file  Stress: Not on file  Social Connections: Moderately Integrated (05/04/2019)   Social Connection and Isolation Panel [NHANES]    Frequency of Communication with Friends and Family: More than three times a week    Frequency of Social Gatherings with Friends and Family: Twice a week    Attends Religious Services: More than 4 times per year    Active Member of [employer] or Organizations: No    Attends Banker Meetings: Never    Marital Status: Married  Catering manager Violence: Not At Risk (05/04/2019)   Humiliation,  Afraid, Rape, and Kick questionnaire    Fear of Current or Ex-Partner: No    Emotionally Abused: No    Physically Abused: No    Sexually Abused: No    Outpatient Medications Prior to Visit  Medication Sig Dispense Refill   ALPRAZolam (XANAX) 0.25 MG tablet Take 1 tablet (0.25 mg total) by mouth 2 (two) times daily as needed for anxiety. 30 tablet 0   loratadine (CLARITIN) 10 MG tablet Take 1 tablet (10 mg total) by mouth daily. 90  tablet 4   naproxen (NAPROSYN) 500 MG tablet Take 1 tablet (500 mg total) by mouth 2 (two) times daily as needed. 30 tablet 0   omeprazole (PRILOSEC) 40 MG capsule Take 1 capsule (40 mg total) by mouth daily. 90 capsule 1   ondansetron (ZOFRAN-ODT) 4 MG disintegrating tablet Take 1 tablet (4 mg total) by mouth every 8 (eight) hours as needed for nausea or vomiting. 20 tablet 0   clindamycin-benzoyl peroxide (BENZACLIN) gel Apply topically 2 (two) times daily. 25 g 0   tirzepatide (ZEPBOUND) 2.5 MG/0.5ML Pen Inject 2.5 mg into the skin once a week. 2 mL 0   No facility-administered medications prior to visit.    No Known Allergies  ROS See HPI    Objective:    Physical Exam Constitutional:      General: She is not in acute distress.    Appearance: Normal appearance. She is well-developed.  HENT:     Head: Normocephalic and atraumatic.     Right Ear: External ear normal.     Left Ear: External ear normal.  Eyes:     General: No scleral icterus. Neck:     Thyroid: No thyromegaly.  Cardiovascular:     Rate and Rhythm: Normal rate and regular rhythm.     Heart sounds: Normal heart sounds. No murmur heard. Pulmonary:     Effort: Pulmonary effort is normal. No respiratory distress.     Breath sounds: Normal breath sounds. No wheezing.  Musculoskeletal:     Cervical back: Neck supple.  Skin:    General: Skin is warm and dry.  Neurological:     Mental Status: She is alert and oriented to person, place, and time.  Psychiatric:        Mood and  Affect: Mood normal.        Behavior: Behavior normal.        Thought Content: Thought content normal.        Judgment: Judgment normal.      BP (!) 151/81   Pulse 78   Temp 98.9 F (37.2 C) (Oral)   Resp 12   Ht 5\' 4"  (1.626 m)   Wt 216 lb (98 kg) Comment: w/ shoes  SpO2 100%   BMI 37.08 kg/m  Wt Readings from Last 3 Encounters:  02/03/24 216 lb (98 kg)  04/15/23 204 lb (92.5 kg)  03/21/22 187 lb (84.8 kg)   BP Readings from Last 3 Encounters:  02/03/24 (!) 151/81  04/15/23 138/82  03/21/22 118/74       Assessment & Plan:   Problem List Items Addressed This Visit       Unprioritized   Obesity (BMI 35.0-39.9 without comorbidity) - Primary    Weight gain to 216 lbs from 187 lbs in 2023. High-calorie beverage consumption noted. Acknowledged dietary impact on weight. - Encourage regular physical activity, e.g., walking, stationary bike. - Advise reducing high-calorie beverages, suggest water with fruit or Crystal Light. - Discuss dietary changes: lean proteins, non-starchy vegetables, whole grains. - Follow-up in three months to reassess weight and health.       Hypertension    Blood pressure elevated at 151 mmHg, linked to weight gain. Family history present. Amlodipine recommended, well-tolerated, no lab monitoring needed. - Prescribe amlodipine 5 mg daily. - Advise home blood pressure monitoring, report if <105/70 mm      Relevant Medications   amLODipine (NORVASC) 5 MG tablet   High risk HPV infection   Normal pap, neg HR HPV  5/23.      GERD (gastroesophageal reflux disease)    Nausea and stomach discomfort linked to water intake, relieved by Coca-Cola. Omeprazole ongoing. - Continue omeprazole. - Advise reducing sodas and juices to manage symptoms.      Chronic alcoholic pancreatitis (HCC)   Admits to drinking 7-8 oz of vodka last night.  Updated lipase is still chronically elevated. She has seen GI in the past for this and they felt it was alcohol  induced. Instructed on alcohol cessation.       Anxiety    Uses alprazolam as needed, especially when driving. Contract update discussed. - Update alprazolam contract for as-needed use. -Obtain UDS.      Relevant Orders   DRUG MONITORING, PANEL 8 WITH CONFIRMATION, URINE   Acne   Found benzamycin gel too drying- trial of Differin gel otc.      Other Visit Diagnoses       Abdominal pain, unspecified abdominal location       Relevant Orders   Lipase (Completed)   Comp Met (CMET) (Completed)       I have discontinued Takesha Steger "Ta-Ma-Ra"'s clindamycin-benzoyl peroxide and Zepbound. I am also having her start on amLODipine. Additionally, I am having her maintain her loratadine, omeprazole, ALPRAZolam, ondansetron, and naproxen.  Meds ordered this encounter  Medications   amLODipine (NORVASC) 5 MG tablet    Sig: Take 1 tablet (5 mg total) by mouth daily.    Dispense:  30 tablet    Refill:  3    Supervising Provider:   Danise Edge A [[id number]]

## 2024-02-04 ENCOUNTER — Other Ambulatory Visit (HOSPITAL_BASED_OUTPATIENT_CLINIC_OR_DEPARTMENT_OTHER): Payer: Self-pay

## 2024-02-04 ENCOUNTER — Encounter: Payer: Self-pay | Admitting: Family

## 2024-02-04 DIAGNOSIS — K860 Alcohol-induced chronic pancreatitis: Secondary | ICD-10-CM | POA: Insufficient documentation

## 2024-02-04 DIAGNOSIS — I10 Essential (primary) hypertension: Secondary | ICD-10-CM | POA: Insufficient documentation

## 2024-02-04 LAB — COMPREHENSIVE METABOLIC PANEL WITH GFR
ALT: 15 U/L (ref 0–35)
AST: 17 U/L (ref 0–37)
Albumin: 4.4 g/dL (ref 3.5–5.2)
Alkaline Phosphatase: 91 U/L (ref 39–117)
BUN: 11 mg/dL (ref 6–23)
CO2: 24 meq/L (ref 19–32)
Calcium: 9.4 mg/dL (ref 8.4–10.5)
Chloride: 104 meq/L (ref 96–112)
Creatinine, Ser: 0.76 mg/dL (ref 0.40–1.20)
GFR: 99.19 mL/min (ref >60.00)
Glucose, Bld: 101 mg/dL — ABNORMAL HIGH (ref 70–99)
Potassium: 3.9 meq/L (ref 3.5–5.1)
Sodium: 137 meq/L (ref 135–145)
Total Bilirubin: 0.3 mg/dL (ref 0.2–1.2)
Total Protein: 7.5 g/dL (ref 6.0–8.3)

## 2024-02-04 LAB — LIPASE: Lipase: 82 U/L — ABNORMAL HIGH (ref 11.0–59.0)

## 2024-02-04 NOTE — Patient Instructions (Signed)
 VISIT SUMMARY:  Today, we discussed your weight management, blood pressure, and other health concerns. We reviewed your current medications and made some adjustments to better manage your symptoms and overall health.  YOUR PLAN:  -OBESITY: Obesity means having an excessive amount of body fat. We discussed the importance of regular physical activity, such as walking and using a stationary bike. You should also reduce high-calorie beverages and focus on a diet with lean proteins, non-starchy vegetables, and whole grains. We will follow up in three months to reassess your weight and health.  -HYPERTENSION: Hypertension is high blood pressure. Your blood pressure is elevated, likely due to weight gain. We prescribed amlodipine 5 mg daily to help manage it. Please monitor your blood pressure at home and report if it drops below 105/70 mmHg or if you feel dizzy. We will follow up in two weeks to reassess your blood pressure.  -GASTROESOPHAGEAL REFLUX DISEASE (GERD): GERD is a condition where stomach acid frequently flows back into the tube connecting your mouth and stomach. Continue taking omeprazole and try to reduce your intake of sodas and juices to manage your symptoms better.  -PANCREATITIS: Pancreatitis is inflammation of the pancreas. You reported occasional stomach pain similar to past pancreatitis episodes. Avoid alcohol to prevent flare-ups, and we will check your lipase levels to assess pancreatic function.  -ACNE: Acne is a skin condition that occurs when hair follicles become plugged with oil and dead skin cells. Since Benzaclin gel was too drying, we recommend using Differin gel, which is less irritating. Use a gentle facial cleanser as well.  -ANXIETY: Anxiety is a feeling of worry or fear that can be mild or severe. You use alprazolam as needed, especially when driving. We have updated your alprazolam contract for as-needed use.  -GENERAL HEALTH MAINTENANCE: Your last Pap smear in May  2030 was negative for HPV. You should schedule a Pap smear every 3-5 years based on these results.  INSTRUCTIONS:  Please follow up in three months to reassess your weight and overall health. Additionally, follow up in two weeks to reassess your blood pressure. Avoid alcohol to prevent pancreatitis flare-ups, and monitor your blood pressure at home, reporting if it drops below 105/70 mmHg or if you feel dizzy.

## 2024-02-04 NOTE — Assessment & Plan Note (Signed)
 Admits to drinking 7-8 oz of vodka last night.  Updated lipase is still chronically elevated. She has seen GI in the past for this and they felt it was alcohol induced. Instructed on alcohol cessation.

## 2024-02-04 NOTE — Assessment & Plan Note (Signed)
 Found benzamycin gel too drying- trial of Differin gel otc.

## 2024-02-04 NOTE — Assessment & Plan Note (Signed)
 Normal pap, neg HR HPV 5/23.

## 2024-02-04 NOTE — Assessment & Plan Note (Signed)
  Weight gain to 216 lbs from 187 lbs in 2023. High-calorie beverage consumption noted. Acknowledged dietary impact on weight. - Encourage regular physical activity, e.g., walking, stationary bike. - Advise reducing high-calorie beverages, suggest water with fruit or Crystal Light. - Discuss dietary changes: lean proteins, non-starchy vegetables, whole grains. - Follow-up in three months to reassess weight and health.

## 2024-02-04 NOTE — Assessment & Plan Note (Signed)
  Uses alprazolam as needed, especially when driving. Contract update discussed. - Update alprazolam contract for as-needed use. -Obtain UDS.

## 2024-02-04 NOTE — Assessment & Plan Note (Signed)
  Blood pressure elevated at 151 mmHg, linked to weight gain. Family history present. Amlodipine recommended, well-tolerated, no lab monitoring needed. - Prescribe amlodipine 5 mg daily. - Advise home blood pressure monitoring, report if <105/70 mm

## 2024-02-04 NOTE — Assessment & Plan Note (Signed)
  Nausea and stomach discomfort linked to water intake, relieved by Coca-Cola. Omeprazole ongoing. - Continue omeprazole. - Advise reducing sodas and juices to manage symptoms.

## 2024-02-05 LAB — DRUG MONITORING, PANEL 8 WITH CONFIRMATION, URINE
6 Acetylmorphine: NEGATIVE ng/mL (ref <10)
Alcohol Metabolites: POSITIVE ng/mL — AB (ref <500)
Amphetamines: NEGATIVE ng/mL (ref <500)
Benzodiazepines: NEGATIVE ng/mL (ref <100)
Buprenorphine, Urine: NEGATIVE ng/mL (ref <5)
Cocaine Metabolite: NEGATIVE ng/mL (ref <150)
Creatinine: 136 mg/dL (ref >=20.0)
Ethyl Glucuronide (ETG): 12403 ng/mL — ABNORMAL HIGH (ref <500)
Ethyl Sulfate (ETS): 4862 ng/mL — ABNORMAL HIGH (ref <100)
MDMA: NEGATIVE ng/mL (ref <500)
Marijuana Metabolite: NEGATIVE ng/mL (ref <20)
Opiates: NEGATIVE ng/mL (ref <100)
Oxidant: NEGATIVE ug/mL (ref <200)
Oxycodone: NEGATIVE ng/mL (ref <100)
pH: 7.4 (ref 4.5–9.0)

## 2024-02-05 LAB — DM TEMPLATE

## 2024-02-06 ENCOUNTER — Encounter: Payer: Self-pay | Admitting: Family

## 2024-02-17 ENCOUNTER — Ambulatory Visit: Admitting: Family

## 2024-02-22 ENCOUNTER — Other Ambulatory Visit: Payer: Self-pay | Admitting: Family

## 2024-02-22 DIAGNOSIS — K219 Gastro-esophageal reflux disease without esophagitis: Secondary | ICD-10-CM

## 2024-02-22 MED ORDER — OMEPRAZOLE 40 MG PO CPDR
40.0000 mg | DELAYED_RELEASE_CAPSULE | Freq: Every day | ORAL | 1 refills | Status: DC
Start: 2024-02-22 — End: 2024-08-29
  Filled 2024-02-22: qty 90, 90d supply, fill #0
  Filled 2024-05-25: qty 90, 90d supply, fill #1

## 2024-02-22 MED ORDER — ALPRAZOLAM 0.25 MG PO TABS
0.2500 mg | ORAL_TABLET | Freq: Two times a day (BID) | ORAL | 0 refills | Status: DC | PRN
  Filled 2024-02-22: qty 30, 15d supply, fill #0

## 2024-02-23 ENCOUNTER — Other Ambulatory Visit (HOSPITAL_BASED_OUTPATIENT_CLINIC_OR_DEPARTMENT_OTHER): Payer: Self-pay

## 2024-02-24 ENCOUNTER — Ambulatory Visit (INDEPENDENT_AMBULATORY_CARE_PROVIDER_SITE_OTHER): Admitting: Family

## 2024-02-24 VITALS — BP 111/77 | HR 94 | Temp 98.6°F | Resp 16 | Ht 64.0 in | Wt 213.0 lb

## 2024-02-24 DIAGNOSIS — I10 Essential (primary) hypertension: Secondary | ICD-10-CM

## 2024-02-24 DIAGNOSIS — K860 Alcohol-induced chronic pancreatitis: Secondary | ICD-10-CM | POA: Diagnosis not present

## 2024-02-24 NOTE — Assessment & Plan Note (Signed)
 Clinically asymptomatic currently but has chronic elevation of her lipase as noted last visit.  Discussed pancreatitis risk with alcohol use and advised abstinence. - Refer to GI specialist for follow-up and surveillance. - Advise to remain abstinent from alcohol.

## 2024-02-24 NOTE — Assessment & Plan Note (Signed)
 BP Readings from Last 3 Encounters:  02/24/24 111/77  02/03/24 (!) 151/81  04/15/23 138/82   Hypertension well-controlled with amlodipine  5 mg. Reports mild dry mouth as a side effect.  - Continue amlodipine  5 mg daily.  - Advise to purchase a blood pressure monitor for home use.

## 2024-02-24 NOTE — Progress Notes (Signed)
 Subjective:     Patient ID: Joan Thomas, female    DOB: 1986/03/14, 38 y.o.   MRN: [id number]  Chief Complaint  Patient presents with   Hypertension    Here for follow up     Hypertension    Discussed the use of AI scribe software for clinical note transcription with the patient, who gave verbal consent to proceed.  History of Present Illness       There are no preventive care reminders to display for this patient.  Past Medical History:  Diagnosis Date   Anxiety    GERD (gastroesophageal reflux disease)    Pancreatitis    Pancreatitis, alcoholic, acute 06/11/2021   Vaginal Pap smear, abnormal     Past Surgical History:  Procedure Laterality Date   ECTOPIC PREGNANCY SURGERY     ESOPHAGEAL MANOMETRY N/A 07/28/2017   Procedure: ESOPHAGEAL MANOMETRY (EM);  Surgeon: Sergio Dandy, MD;  Location: [facility];  Service: Endoscopy;  Laterality: N/A;   PH IMPEDANCE STUDY N/A 07/28/2017   Procedure: PH IMPEDANCE STUDY;  Surgeon: Sergio Dandy, MD;  Location: [facility];  Service: Endoscopy;  Laterality: N/A;    Family History  Problem Relation Age of Onset   Hypertension Mother    Multiple myeloma Father    Hypertension Sister        thrombocytosis   Arthritis Maternal Grandmother    GER disease Maternal Grandmother    Gout Maternal Grandmother    Osteoporosis Maternal Grandmother    Diabetes Maternal Grandfather    Heart disease Maternal Grandfather    Sleep apnea Maternal Grandfather    Kidney failure Paternal Grandmother    Breast cancer Paternal Grandmother    COPD Paternal Grandmother    Prostate cancer Paternal Grandfather        several types   Breast cancer Maternal Aunt    Prostate cancer Maternal Uncle    Alcohol abuse Neg Hx    Asthma Neg Hx    Birth defects Neg Hx    Depression Neg Hx    Drug abuse Neg Hx    Early death Neg Hx    Hearing loss Neg Hx    Hyperlipidemia Neg Hx    Kidney disease Neg Hx    Learning disabilities  Neg Hx    Mental illness Neg Hx    Mental retardation Neg Hx    Miscarriages / Stillbirths Neg Hx    Stroke Neg Hx    Vision loss Neg Hx    Esophageal cancer Neg Hx     Social History   Socioeconomic History   Marital status: Married    Spouse name: Not on file   Number of children: 0   Years of education: Not on file   Highest education level: Associate degree: academic program  Occupational History   Not on file  Tobacco Use   Smoking status: Former    Current packs/day: 0.50    Types: Cigarettes   Smokeless tobacco: Never   Tobacco comments:    None since 06/05/21  Vaping Use   Vaping status: Former   Substances: CBD, Engineer, production, Synthetic cannabinoids   Devices: hooka  Substance and Sexual Activity   Alcohol use: Not Currently    Comment: 1/5th on the weekend   Drug use: No   Sexual activity: Yes    Partners: Male    Birth control/protection: Condom  Other Topics Concern   Not on file  Social History Narrative   Works at [employer] ,  completed an accounting degree online.     Separated from her husband   No children    Has a dog   Family is out of town.    Enjoys cooking/reading.    Social Drivers of Health   Financial Resource Strain: Medium Risk (02/24/2024)   Overall Financial Resource Strain (CARDIA)    Difficulty of Paying Living Expenses: Somewhat hard  Food Insecurity: No Food Insecurity (02/24/2024)   Hunger Vital Sign    Worried About Running Out of Food in the Last Year: Never true    Ran Out of Food in the Last Year: Never true  Transportation Needs: No Transportation Needs (02/24/2024)   PRAPARE - Administrator, Civil Service (Medical): No    Lack of Transportation (Non-Medical): No  Physical Activity: Unknown (02/24/2024)   Exercise Vital Sign    Days of Exercise per Week: 0 days    Minutes of Exercise per Session: Not on file  Stress: Stress Concern Present (02/24/2024)   [employer] of Occupational Health - Occupational Stress  Questionnaire    Feeling of Stress : To some extent  Social Connections: Moderately Isolated (02/24/2024)   Social Connection and Isolation Panel [NHANES]    Frequency of Communication with Friends and Family: More than three times a week    Frequency of Social Gatherings with Friends and Family: Once a week    Attends Religious Services: 1 to 4 times per year    Active Member of [employer] or Organizations: No    Attends Engineer, structural: Not on file    Marital Status: Divorced  Intimate Partner Violence: Not At Risk (05/04/2019)   Humiliation, Afraid, Rape, and Kick questionnaire    Fear of Current or Ex-Partner: No    Emotionally Abused: No    Physically Abused: No    Sexually Abused: No    Outpatient Medications Prior to Visit  Medication Sig Dispense Refill   ALPRAZolam  (XANAX ) 0.25 MG tablet Take 1 tablet (0.25 mg total) by mouth 2 (two) times daily as needed for anxiety. 30 tablet 0   amLODipine  (NORVASC ) 5 MG tablet Take 1 tablet (5 mg total) by mouth daily. 30 tablet 3   loratadine  (CLARITIN ) 10 MG tablet Take 1 tablet (10 mg total) by mouth daily. 90 tablet 4   naproxen  (NAPROSYN ) 500 MG tablet Take 1 tablet (500 mg total) by mouth 2 (two) times daily as needed. 30 tablet 0   omeprazole  (PRILOSEC) 40 MG capsule Take 1 capsule (40 mg total) by mouth daily. 90 capsule 1   ondansetron  (ZOFRAN -ODT) 4 MG disintegrating tablet Take 1 tablet (4 mg total) by mouth every 8 (eight) hours as needed for nausea or vomiting. 20 tablet 0   No facility-administered medications prior to visit.    No Known Allergies  ROS    See HPI Objective:    Physical Exam Constitutional:      General: She is not in acute distress.    Appearance: Normal appearance. She is well-developed.  HENT:     Head: Normocephalic and atraumatic.     Right Ear: External ear normal.     Left Ear: External ear normal.  Eyes:     General: No scleral icterus. Neck:     Thyroid : No thyromegaly.   Cardiovascular:     Rate and Rhythm: Normal rate and regular rhythm.     Heart sounds: Normal heart sounds. No murmur heard. Pulmonary:     Effort: Pulmonary effort is  normal. No respiratory distress.     Breath sounds: Normal breath sounds. No wheezing.  Musculoskeletal:     Cervical back: Neck supple.  Skin:    General: Skin is warm and dry.  Neurological:     Mental Status: She is alert and oriented to person, place, and time.  Psychiatric:        Mood and Affect: Mood normal.        Behavior: Behavior normal.        Thought Content: Thought content normal.        Judgment: Judgment normal.      BP 111/77 (BP Location: Right Arm, Patient Position: Sitting, Cuff Size: Large)   Pulse 94   Temp 98.6 F (37 C) (Oral)   Resp 16   Ht 5\' 4"  (1.626 m)   Wt 213 lb (96.6 kg)   SpO2 100%   BMI 36.56 kg/m  Wt Readings from Last 3 Encounters:  02/24/24 213 lb (96.6 kg)  02/03/24 216 lb (98 kg)  04/15/23 204 lb (92.5 kg)       Assessment & Plan:   Problem List Items Addressed This Visit       Unprioritized   Hypertension   BP Readings from Last 3 Encounters:  02/24/24 111/77  02/03/24 (!) 151/81  04/15/23 138/82   Hypertension well-controlled with amlodipine  5 mg. Reports mild dry mouth as a side effect.  - Continue amlodipine  5 mg daily.  - Advise to purchase a blood pressure monitor for home use.        Chronic alcoholic pancreatitis (HCC) - Primary   Clinically asymptomatic currently but has chronic elevation of her lipase as noted last visit.  Discussed pancreatitis risk with alcohol use and advised abstinence. - Refer to GI specialist for follow-up and surveillance. - Advise to remain abstinent from alcohol.       Relevant Orders   Ambulatory referral to Gastroenterology    I am having Chrissey Ringor "Ta-Ma-Ra" maintain her loratadine , ondansetron , naproxen , amLODipine , omeprazole , and ALPRAZolam .  No orders of the defined types were placed in this  encounter.

## 2024-02-24 NOTE — Patient Instructions (Signed)
 VISIT SUMMARY:  Today, you had a follow-up appointment to check on your blood pressure management. Your blood pressure has improved significantly since starting amlodipine , and you are tolerating the medication well with only a minor side effect of dry mouth. We also reviewed your recent lab work, which showed a slightly elevated blood sugar level. No new symptoms or concerns were noted.  YOUR PLAN:  -HYPERTENSION: Hypertension, or high blood pressure, is when the force of the blood against your artery walls is too high. Your blood pressure is well-controlled with amlodipine  5 mg daily. You mentioned experiencing dry mouth as a side effect. Please continue taking amlodipine  as prescribed and consider purchasing a blood pressure monitor for home use to keep track of your readings.  -ELEVATED LIPASE: Elevated lipase levels can indicate issues with the pancreas, such as pancreatitis. We discussed the risk of pancreatitis with alcohol use, and you were advised to abstain from alcohol. You will be referred to a gastrointestinal (GI) specialist for further follow-up and monitoring.  -WELLNESS VISIT: This was a routine wellness visit where we noted that your blood pressure is well-controlled and you have lost three pounds. Keep up the good work with your health management.  INSTRUCTIONS:  Please schedule a follow-up appointment in four months. Additionally, you will be referred to a GI specialist for further evaluation of your elevated lipase levels.

## 2024-03-02 ENCOUNTER — Encounter: Payer: Self-pay | Admitting: Family

## 2024-04-01 ENCOUNTER — Other Ambulatory Visit (HOSPITAL_COMMUNITY): Payer: Self-pay

## 2024-04-03 ENCOUNTER — Other Ambulatory Visit (HOSPITAL_COMMUNITY): Payer: Self-pay

## 2024-04-28 ENCOUNTER — Other Ambulatory Visit (HOSPITAL_BASED_OUTPATIENT_CLINIC_OR_DEPARTMENT_OTHER): Payer: Self-pay

## 2024-05-05 ENCOUNTER — Other Ambulatory Visit (HOSPITAL_BASED_OUTPATIENT_CLINIC_OR_DEPARTMENT_OTHER): Payer: Self-pay

## 2024-05-25 ENCOUNTER — Other Ambulatory Visit (HOSPITAL_BASED_OUTPATIENT_CLINIC_OR_DEPARTMENT_OTHER): Payer: Self-pay

## 2024-05-25 ENCOUNTER — Other Ambulatory Visit: Payer: Self-pay

## 2024-05-25 ENCOUNTER — Other Ambulatory Visit: Payer: Self-pay | Admitting: Family

## 2024-05-25 ENCOUNTER — Encounter: Payer: Self-pay | Admitting: Family

## 2024-05-25 MED ORDER — ALPRAZOLAM 0.25 MG PO TABS
0.2500 mg | ORAL_TABLET | Freq: Two times a day (BID) | ORAL | 0 refills | Status: DC | PRN
  Filled 2024-05-25: qty 30, 15d supply, fill #0

## 2024-05-25 MED ORDER — AMLODIPINE BESYLATE 5 MG PO TABS
5.0000 mg | ORAL_TABLET | Freq: Every day | ORAL | 3 refills | Status: DC
  Filled 2024-05-25: qty 30, 30d supply, fill #0

## 2024-05-25 MED ORDER — ONDANSETRON 4 MG PO TBDP
4.0000 mg | ORAL_TABLET | Freq: Three times a day (TID) | ORAL | 0 refills | Status: DC | PRN
  Filled 2024-05-25: qty 20, 7d supply, fill #0

## 2024-05-26 ENCOUNTER — Ambulatory Visit (INDEPENDENT_AMBULATORY_CARE_PROVIDER_SITE_OTHER): Payer: Self-pay | Admitting: Gastroenterology

## 2024-05-26 ENCOUNTER — Encounter: Payer: Self-pay | Admitting: Gastroenterology

## 2024-05-26 VITALS — BP 98/60 | HR 88 | Ht 64.25 in | Wt 213.5 lb

## 2024-05-26 DIAGNOSIS — R748 Abnormal levels of other serum enzymes: Secondary | ICD-10-CM | POA: Diagnosis not present

## 2024-05-26 DIAGNOSIS — K852 Alcohol induced acute pancreatitis without necrosis or infection: Secondary | ICD-10-CM | POA: Diagnosis not present

## 2024-05-26 DIAGNOSIS — F1090 Alcohol use, unspecified, uncomplicated: Secondary | ICD-10-CM | POA: Diagnosis not present

## 2024-05-26 NOTE — Patient Instructions (Signed)
 _______________________________________________________  If your blood pressure at your visit was 140/90 or greater, please contact your primary care physician to follow up on this.  _______________________________________________________  If you are age 39 or older, your body mass index should be between 23-30. Your Body mass index is 36.36 kg/m. If this is out of the aforementioned range listed, please consider follow up with your Primary Care Provider.  If you are age 17 or younger, your body mass index should be between 19-25. Your Body mass index is 36.36 kg/m. If this is out of the aformentioned range listed, please consider follow up with your Primary Care Provider.   ________________________________________________________  The [facility] providers would like to encourage you to use MYCHART to communicate with providers for non-urgent requests or questions.  Due to long hold times on the telephone, sending your provider a message by [facility] may be a faster and more efficient way to get a response.  Please allow 48 business hours for a response.  Please remember that this is for non-urgent requests.  _______________________________________________________  [facility] is using a team-based approach to care.  Your team is made up of your doctor and two to three APPS. Our APPS (Nurse Practitioners and Physician Assistants) work with your physician to ensure care continuity for you. They are fully qualified to address your health concerns and develop a treatment plan. They communicate directly with your gastroenterologist to care for you. Seeing the Advanced Practice Practitioners on your physician's team can help you by facilitating care more promptly, often allowing for earlier appointments, access to diagnostic testing, procedures, and other specialty referrals.   Thank you for trusting me with your gastrointestinal care!    Dr. Victory Legrand [facility]

## 2024-05-26 NOTE — Progress Notes (Signed)
 [facility] Progress Note  Chief Complaint: Recurrent pancreatitis  Summary of GI history:  2018 evaluation for heartburn dysphagia with normal esophageal manometry as well as pH/impedance testing off PPI.  Advised smoking cessation Chronic alcohol related pancreatitis, last seen May 2023 for same after an episode of acute pancreatitis at an outside health system from binge drinking.  Subjective  HPI:  Joan Thomas was here today at the request of her primary care for reevaluation of her pancreatitis and an elevated lipase level. She reports still having alcohol regularly, though less than before.  It still tends to be episodic, perhaps once or twice a week when going out with friends, though she admits that when she does drink, she cannot control it and will do so excessively.  However she is making efforts at that in the recent months, particularly after the lab work done in April.  When I brought up the alcohol metabolite result, she said she had just returned from a vacation during which she had alcohol use. Fortunately, [place] is feeling well from a digestive standpoint.  She denies dysphagia chronic nausea vomiting heartburn or loss of appetite.  She had gained weight and thus primary care recommended decrease alcohol intake and also a trial of GLP-1 agonist.  She took a dose of one of them and it gave her severe nausea and vomiting so she stopped. Bowel habits regular without loose or greasy stool, denies rectal bleeding (Late for today's visit) ROS: Cardiovascular:  no chest pain Respiratory: no dyspnea  The patient's Past Medical, Family and Social History were reviewed and are on file in the EMR. Past Medical History:  Diagnosis Date   Anxiety    GERD (gastroesophageal reflux disease)    Pancreatitis    Pancreatitis, alcoholic, acute 06/11/2021   Vaginal Pap smear, abnormal     Past Surgical History:  Procedure Laterality Date   ECTOPIC PREGNANCY SURGERY      ESOPHAGEAL MANOMETRY N/A 07/28/2017   Procedure: ESOPHAGEAL MANOMETRY (EM);  Surgeon: Shila Gustav GAILS, MD;  Location: [facility];  Service: Endoscopy;  Laterality: N/A;   PH IMPEDANCE STUDY N/A 07/28/2017   Procedure: PH IMPEDANCE STUDY;  Surgeon: Shila Gustav GAILS, MD;  Location: [facility];  Service: Endoscopy;  Laterality: N/A;    Objective:  Med list reviewed  Current Outpatient Medications:    ALPRAZolam  (XANAX ) 0.25 MG tablet, Take 1 tablet (0.25 mg total) by mouth 2 (two) times daily as needed for anxiety., Disp: 30 tablet, Rfl: 0   amLODipine  (NORVASC ) 5 MG tablet, Take 1 tablet (5 mg total) by mouth daily., Disp: 30 tablet, Rfl: 3   loratadine  (CLARITIN ) 10 MG tablet, Take 1 tablet (10 mg total) by mouth daily., Disp: 90 tablet, Rfl: 4   naproxen  (NAPROSYN ) 500 MG tablet, Take 1 tablet (500 mg total) by mouth 2 (two) times daily as needed., Disp: 30 tablet, Rfl: 0   omeprazole  (PRILOSEC) 40 MG capsule, Take 1 capsule (40 mg total) by mouth daily., Disp: 90 capsule, Rfl: 1   ondansetron  (ZOFRAN -ODT) 4 MG disintegrating tablet, Take 1 tablet (4 mg total) by mouth every 8 (eight) hours as needed for nausea or vomiting., Disp: 20 tablet, Rfl: 0   Vital signs in last 24 hrs: Vitals:   05/26/24 1049  BP: 98/60  Pulse: 88   Wt Readings from Last 3 Encounters:  05/26/24 213 lb 8 oz (96.8 kg)  02/24/24 213 lb (96.6 kg)  02/03/24 216 lb (98 kg)  Physical Exam  Well-appearing HEENT: sclera anicteric, oral mucosa moist without lesions Neck: supple, no thyromegaly, JVD or lymphadenopathy Cardiac: Regular without appreciable murmur,  no peripheral edema Pulm: clear to auscultation bilaterally, normal RR and effort noted Abdomen: soft, no tenderness, with active bowel sounds. No guarding or palpable hepatosplenomegaly. Skin; warm and dry, no jaundice or rash  Labs:     Latest Ref Rng & Units 06/26/2021    8:28 AM 06/18/2021    8:31 AM 06/13/2021    8:53 AM  CBC  WBC  4.0 - 10.5 K/uL 5.4  6.1  4.8   Hemoglobin 12.0 - 15.0 g/dL 87.8  88.5  89.0   Hematocrit 36.0 - 46.0 % 37.2  34.9  33.0   Platelets 150.0 - 400.0 K/uL 381.0  462.0  310.0       Latest Ref Rng & Units 02/03/2024    6:23 PM 04/15/2023    2:38 PM 06/26/2021    8:28 AM  CMP  Glucose 70 - 99 mg/dL 898  92  92   BUN 6 - 23 mg/dL 11  11  5    Creatinine 0.40 - 1.20 mg/dL 9.23  9.08  9.28   Sodium 135 - 145 mEq/L 137  135  138   Potassium 3.5 - 5.1 mEq/L 3.9  4.2  4.3   Chloride 96 - 112 mEq/L 104  100  104   CO2 19 - 32 mEq/L 24  26  26    Calcium 8.4 - 10.5 mg/dL 9.4  9.4  9.3   Total Protein 6.0 - 8.3 g/dL 7.5  7.3  6.1    6.1   Total Bilirubin 0.2 - 1.2 mg/dL 0.3  0.5  0.3    0.3   Alkaline Phos 39 - 117 U/L 91  69  67    67   AST 0 - 37 U/L 17  31  17    17    ALT 0 - 35 U/L 15  28  18    18    No results found for: LABPROT  Albumin 4.4  Toxicology and alcohol screen by primary care on 02/03/2024 positive for high degree of alcohol metabolites (ETG and ETS)  Lipase     Component Value Date/Time   LIPASE 82.0 (H) 02/03/2024 1823    ___________________________________________ Radiologic studies:   ____________________________________________ Other:   _____________________________________________ Assessment & Plan  Assessment: Encounter Diagnoses  Name Primary?   Alcohol-induced acute pancreatitis without infection or necrosis Yes   Alcohol use disorder     Her mildly elevated lipase was from ongoing alcohol use that was heavy in the period of time just before those labs were done.  There may still be fluctuations of this level even with complete alcohol abstinence since she has had previous episodes of pancreatitis and there may be some degree of pancreatic damage from that.  Fortunately no clinical signs of EPI.  We had a discussion about her alcohol use today.  Even though it is not daily and she does not describe withdrawal symptoms when she goes without alcohol,  the heavy binge use still represents an alcohol use disorder.  As such, I think she should consider some counseling for that when and if she feels ready.  She will give it some further consideration.  Plan: She will otherwise plan to see me as needed  20 minutes were spent on this encounter (including chart review, history/exam, counseling/coordination of care, and documentation) > 50% of that time was spent on  counseling and coordination of care.   Victory LITTIE Brand III

## 2024-05-27 ENCOUNTER — Other Ambulatory Visit: Payer: Self-pay

## 2024-05-27 ENCOUNTER — Other Ambulatory Visit (HOSPITAL_BASED_OUTPATIENT_CLINIC_OR_DEPARTMENT_OTHER): Payer: Self-pay

## 2024-05-27 MED ORDER — AMLODIPINE BESYLATE 5 MG PO TABS: 5.0000 mg | ORAL_TABLET | Freq: Every day | ORAL | 3 refills | Status: DC

## 2024-06-25 ENCOUNTER — Ambulatory Visit (INDEPENDENT_AMBULATORY_CARE_PROVIDER_SITE_OTHER): Admitting: Family

## 2024-06-25 VITALS — BP 123/63 | HR 79 | Temp 98.7°F | Resp 16 | Ht 64.25 in | Wt 215.0 lb

## 2024-06-25 DIAGNOSIS — I10 Essential (primary) hypertension: Secondary | ICD-10-CM

## 2024-06-25 DIAGNOSIS — L700 Acne vulgaris: Secondary | ICD-10-CM

## 2024-06-25 DIAGNOSIS — E669 Obesity, unspecified: Secondary | ICD-10-CM

## 2024-06-25 DIAGNOSIS — K860 Alcohol-induced chronic pancreatitis: Secondary | ICD-10-CM

## 2024-06-25 DIAGNOSIS — B977 Papillomavirus as the cause of diseases classified elsewhere: Secondary | ICD-10-CM

## 2024-06-25 DIAGNOSIS — F419 Anxiety disorder, unspecified: Secondary | ICD-10-CM

## 2024-06-25 MED ORDER — CLINDAMYCIN PHOS (TWICE-DAILY) 1 % EX GEL: Freq: Two times a day (BID) | CUTANEOUS | 2 refills | Status: DC

## 2024-06-25 NOTE — Progress Notes (Signed)
 Subjective:     Patient ID: Joan Thomas, female    DOB: [birth year], 38 y.o.   MRN: [id number]  Chief Complaint  Patient presents with   Anxiety    Here for follow up   Acne    Patient reports having acne    Anxiety      Discussed the use of AI scribe software for clinical note transcription with the patient, who gave verbal consent to proceed.  History of Present Illness  Joan Thomas is a 38 year old female who presents for a routine follow-up and to discuss acne treatment.  She experiences adverse effects from Benzamycin gel, including skin breakouts and dryness, and is interested in trying a different topical treatment for acne.  She takes amlodipine  for hypertension, with a recent blood pressure of 123/63 mmHg. She had started losing weight but has regained it.  She has pancreatitis with previously elevated lipase levels and occasionally drinks heavily during social gatherings.  She uses Xanax  as needed for anxiety and insomnia, particularly when feeling restless or unable to sleep. She is not under the care of other healthcare providers for this medication.  She recently started a new job at [employer] in [place], working from 6 AM to 2:30 PM. She enjoys the schedule but feels tired in the evenings and sometimes naps after work, which may affect her nighttime sleep.     Health Maintenance Due  Topic Date Due   Hepatitis B Vaccines 19-59 Average Risk (1 of 3 - 19+ 3-dose series) Never done   HPV VACCINES (1 - 3-dose SCDM series) Never done   INFLUENZA VACCINE  05/28/2024    Past Medical History:  Diagnosis Date   Anxiety    GERD (gastroesophageal reflux disease)    Pancreatitis    Pancreatitis, alcoholic, acute 06/11/2021   Vaginal Pap smear, abnormal     Past Surgical History:  Procedure Laterality Date   ECTOPIC PREGNANCY SURGERY     ESOPHAGEAL MANOMETRY N/A 07/28/2017   Procedure: ESOPHAGEAL MANOMETRY (EM);  Surgeon:  Shila Gustav GAILS, MD;  Location: [facility];  Service: Endoscopy;  Laterality: N/A;   PH IMPEDANCE STUDY N/A 07/28/2017   Procedure: PH IMPEDANCE STUDY;  Surgeon: Shila Gustav GAILS, MD;  Location: [facility];  Service: Endoscopy;  Laterality: N/A;    Family History  Problem Relation Age of Onset   Hypertension Mother    Multiple myeloma Father    Hypertension Sister        thrombocytosis   Arthritis Maternal Grandmother    GER disease Maternal Grandmother    Gout Maternal Grandmother    Osteoporosis Maternal Grandmother    Diabetes Maternal Grandfather    Heart disease Maternal Grandfather    Sleep apnea Maternal Grandfather    Kidney failure Paternal Grandmother    Breast cancer Paternal Grandmother    COPD Paternal Grandmother    Prostate cancer Paternal Grandfather        several types   Breast cancer Maternal Aunt    Prostate cancer Maternal Uncle    Alcohol abuse Neg Hx    Asthma Neg Hx    Birth defects Neg Hx    Depression Neg Hx    Drug abuse Neg Hx    Early death Neg Hx    Hearing loss Neg Hx    Hyperlipidemia Neg Hx    Kidney disease Neg Hx    Learning disabilities Neg Hx    Mental illness Neg Hx    Mental  retardation Neg Hx    Miscarriages / Stillbirths Neg Hx    Stroke Neg Hx    Vision loss Neg Hx    Esophageal cancer Neg Hx     Social History   Socioeconomic History   Marital status: Married    Spouse name: Not on file   Number of children: 0   Years of education: Not on file   Highest education level: Associate degree: academic program  Occupational History   Not on file  Tobacco Use   Smoking status: Former    Current packs/day: 0.50    Types: Cigarettes   Smokeless tobacco: Never   Tobacco comments:    None since 06/05/21  Vaping Use   Vaping status: Former   Substances: CBD, Engineer, production, Synthetic cannabinoids   Devices: hooka  Substance and Sexual Activity   Alcohol use: Not Currently    Comment: 1/5th on the weekend   Drug use:  No   Sexual activity: Yes    Partners: Male    Birth control/protection: Condom  Other Topics Concern   Not on file  Social History Narrative   Works at [employer] , completed an Metallurgist.     Separated from her husband   No children    Has a dog   Family is out of town.    Enjoys cooking/reading.    Social Drivers of Health   Financial Resource Strain: Medium Risk (06/24/2024)   Overall Financial Resource Strain (CARDIA)    Difficulty of Paying Living Expenses: Somewhat hard  Food Insecurity: Food Insecurity Present (06/24/2024)   Hunger Vital Sign    Worried About Running Out of Food in the Last Year: Sometimes true    Ran Out of Food in the Last Year: Sometimes true  Transportation Needs: No Transportation Needs (06/24/2024)   PRAPARE - Administrator, Civil Service (Medical): No    Lack of Transportation (Non-Medical): No  Physical Activity: Inactive (06/24/2024)   Exercise Vital Sign    Days of Exercise per Week: 0 days    Minutes of Exercise per Session: Not on file  Stress: No Stress Concern Present (06/24/2024)   [employer] of Occupational Health - Occupational Stress Questionnaire    Feeling of Stress: Only a little  Social Connections: Moderately Isolated (06/24/2024)   Social Connection and Isolation Panel    Frequency of Communication with Friends and Family: More than three times a week    Frequency of Social Gatherings with Friends and Family: Three times a week    Attends Religious Services: 1 to 4 times per year    Active Member of Clubs or Organizations: No    Attends Banker Meetings: Not on file    Marital Status: Divorced  Intimate Partner Violence: Not At Risk (05/04/2019)   Humiliation, Afraid, Rape, and Kick questionnaire    Fear of Current or Ex-Partner: No    Emotionally Abused: No    Physically Abused: No    Sexually Abused: No    Outpatient Medications Prior to Visit  Medication Sig Dispense Refill    ALPRAZolam  (XANAX ) 0.25 MG tablet Take 1 tablet (0.25 mg total) by mouth 2 (two) times daily as needed for anxiety. 30 tablet 0   amLODipine  (NORVASC ) 5 MG tablet Take 1 tablet (5 mg total) by mouth daily. 30 tablet 3   loratadine  (CLARITIN ) 10 MG tablet Take 1 tablet (10 mg total) by mouth daily. 90 tablet 4   naproxen  (NAPROSYN ) 500  MG tablet Take 1 tablet (500 mg total) by mouth 2 (two) times daily as needed. 30 tablet 0   omeprazole  (PRILOSEC) 40 MG capsule Take 1 capsule (40 mg total) by mouth daily. 90 capsule 1   ondansetron  (ZOFRAN -ODT) 4 MG disintegrating tablet Take 1 tablet (4 mg total) by mouth every 8 (eight) hours as needed for nausea or vomiting. 20 tablet 0   No facility-administered medications prior to visit.    No Known Allergies  ROS    See HPI Objective:    Physical Exam Constitutional:      General: She is not in acute distress.    Appearance: Normal appearance. She is well-developed.  HENT:     Head: Normocephalic and atraumatic.     Right Ear: External ear normal.     Left Ear: External ear normal.  Eyes:     General: No scleral icterus. Neck:     Thyroid : No thyromegaly.  Cardiovascular:     Rate and Rhythm: Normal rate and regular rhythm.     Heart sounds: Normal heart sounds. No murmur heard. Pulmonary:     Effort: Pulmonary effort is normal. No respiratory distress.     Breath sounds: Normal breath sounds. No wheezing.  Musculoskeletal:     Cervical back: Neck supple.  Skin:    General: Skin is warm and dry.     Comments: Facial acne noted  Neurological:     Mental Status: She is alert and oriented to person, place, and time.  Psychiatric:        Mood and Affect: Mood normal.        Behavior: Behavior normal.        Thought Content: Thought content normal.        Judgment: Judgment normal.      BP 123/63 (BP Location: Right Arm, Patient Position: Sitting, Cuff Size: Normal)   Pulse 79   Temp 98.7 F (37.1 C) (Oral)   Resp 16   Ht 5'  4.25 (1.632 m)   Wt 215 lb (97.5 kg)   LMP 05/05/2024   SpO2 99%   BMI 36.62 kg/m  Wt Readings from Last 3 Encounters:  06/25/24 215 lb (97.5 kg)  05/26/24 213 lb 8 oz (96.8 kg)  02/24/24 213 lb (96.6 kg)       Assessment & Plan:   Problem List Items Addressed This Visit       Unprioritized   Obesity (BMI 35.0-39.9 without comorbidity)   Discussed importance of weight loss.       Hypertension   Stable on amlodipine . Continue same.      High risk HPV infection   Recommend HPV vaccine- she wishes to schedule for a different day.      Chronic alcoholic pancreatitis (HCC)   She understands need to complete abstain from alcohol.       Relevant Orders   Comp Met (CMET)   Lipase   Anxiety   Stable with prn use of xanax .  Controlled substance contract is updated today.       Acne - Primary   Uncontrolled. She found benzaclin too drying. Will rx with clindagel. If no improvement she will let me know and I will plan referral to Dermatology.       Relevant Medications   clindamycin  (CLINDAGEL) 1 % gel    I am having Emmie Maidens Ta-Ma-Ra start on clindamycin . I am also having her maintain her loratadine , naproxen , omeprazole , ondansetron , ALPRAZolam , and amLODipine .  Meds ordered  this encounter  Medications   clindamycin  (CLINDAGEL) 1 % gel    Sig: Apply topically 2 (two) times daily.    Dispense:  30 g    Refill:  2    Supervising Provider:   DOMENICA BLACKBIRD A [[id number]]

## 2024-06-25 NOTE — Assessment & Plan Note (Signed)
Stable on amlodipine. Continue same.  

## 2024-06-25 NOTE — Patient Instructions (Signed)
 VISIT SUMMARY:  Today, we discussed your acne treatment, blood pressure management, anxiety, and general health maintenance. We also reviewed your pancreatitis history and alcohol consumption.  YOUR PLAN:  ACNE VULGARIS: You experienced dryness and rash from Benzamycin gel. -We prescribed topical clindamycin  instead. Please contact us  if it is not effective, and we may refer you to a dermatologist. -Your prescription has been sent to [facility] pharmacy.  ESSENTIAL HYPERTENSION: Your blood pressure is well controlled at 123/63 mmHg with amlodipine . -We discussed the possibility of reducing your medication if your blood pressure continues to trend lower with weight loss.  ANXIETY DISORDER: You manage your anxiety and insomnia with alprazolam  as needed. -We updated your controlled substance contract for alprazolam .  PANCREATITIS: You have a history of alcohol related pancreatitis with no current symptoms. -Continue to avoid alcohol to prevent flare-ups.  GENERAL HEALTH MAINTENANCE: We discussed hepatitis B and HPV vaccination guidelines. -We will schedule your hepatitis B and Gardasil vaccinations. -Plan for follow-up doses in two months.

## 2024-06-25 NOTE — Assessment & Plan Note (Signed)
 She understands need to complete abstain from alcohol.

## 2024-06-25 NOTE — Assessment & Plan Note (Signed)
 Stable with prn use of xanax .  Controlled substance contract is updated today.

## 2024-06-25 NOTE — Assessment & Plan Note (Signed)
 Uncontrolled. She found benzaclin too drying. Will rx with clindagel. If no improvement she will let me know and I will plan referral to Dermatology.

## 2024-06-25 NOTE — Assessment & Plan Note (Signed)
 Discussed importance of weight loss.

## 2024-06-25 NOTE — Assessment & Plan Note (Signed)
 Recommend HPV vaccine- she wishes to schedule for a different day.

## 2024-06-26 ENCOUNTER — Ambulatory Visit: Payer: Self-pay | Admitting: Family

## 2024-06-26 ENCOUNTER — Encounter

## 2024-06-26 DIAGNOSIS — L700 Acne vulgaris: Secondary | ICD-10-CM

## 2024-06-26 LAB — COMPREHENSIVE METABOLIC PANEL WITH GFR
AG Ratio: 1.5 (calc) (ref 1.0–2.5)
ALT: 16 U/L (ref 6–29)
AST: 14 U/L (ref 10–30)
Albumin: 4.3 g/dL (ref 3.6–5.1)
Alkaline phosphatase (APISO): 72 U/L (ref 31–125)
BUN: 10 mg/dL (ref 7–25)
CO2: 26 mmol/L (ref 20–32)
Calcium: 9.2 mg/dL (ref 8.6–10.2)
Chloride: 103 mmol/L (ref 98–110)
Creat: 0.65 mg/dL (ref 0.50–0.97)
Globulin: 2.8 g/dL (ref 1.9–3.7)
Glucose, Bld: 101 mg/dL — ABNORMAL HIGH (ref 65–99)
Potassium: 4.3 mmol/L (ref 3.5–5.3)
Sodium: 135 mmol/L (ref 135–146)
Total Bilirubin: 0.2 mg/dL (ref 0.2–1.2)
Total Protein: 7.1 g/dL (ref 6.1–8.1)
eGFR: 115 mL/min/1.73m2 (ref >=60)

## 2024-06-26 LAB — LIPASE: Lipase: 52 U/L (ref 7–60)

## 2024-06-28 ENCOUNTER — Telehealth: Admitting: Physician Assistant

## 2024-06-28 DIAGNOSIS — T17908A Unspecified foreign body in respiratory tract, part unspecified causing other injury, initial encounter: Secondary | ICD-10-CM | POA: Diagnosis not present

## 2024-06-28 NOTE — Patient Instructions (Signed)
  Emmie Maidens, thank you for joining Lynden GORMAN Snuffer, PA-C for today's virtual visit.  While this provider is not your primary care provider (PCP), if your PCP is located in our provider database this encounter information will be shared with them immediately following your visit.   A [facility] MyChart account gives you access to today's visit and all your visits, tests, and labs performed at [facility]  click here if you don't have a [facility] MyChart account or go to [email or web address]  Consent: (Patient) Iyanla Eilers provided verbal consent for this virtual visit at the beginning of the encounter.  Current Medications:  Current Outpatient Medications:    ALPRAZolam  (XANAX ) 0.25 MG tablet, Take 1 tablet (0.25 mg total) by mouth 2 (two) times daily as needed for anxiety., Disp: 30 tablet, Rfl: 0   amLODipine  (NORVASC ) 5 MG tablet, Take 1 tablet (5 mg total) by mouth daily., Disp: 30 tablet, Rfl: 3   clindamycin  (CLINDAGEL ) 1 % gel, Apply topically 2 (two) times daily., Disp: 30 g, Rfl: 2   loratadine  (CLARITIN ) 10 MG tablet, Take 1 tablet (10 mg total) by mouth daily., Disp: 90 tablet, Rfl: 4   naproxen  (NAPROSYN ) 500 MG tablet, Take 1 tablet (500 mg total) by mouth 2 (two) times daily as needed., Disp: 30 tablet, Rfl: 0   omeprazole  (PRILOSEC) 40 MG capsule, Take 1 capsule (40 mg total) by mouth daily., Disp: 90 capsule, Rfl: 1   ondansetron  (ZOFRAN -ODT) 4 MG disintegrating tablet, Take 1 tablet (4 mg total) by mouth every 8 (eight) hours as needed for nausea or vomiting., Disp: 20 tablet, Rfl: 0   Medications ordered in this encounter:  No orders of the defined types were placed in this encounter.    *If you need refills on other medications prior to your next appointment, please contact your pharmacy*  Follow-Up: Call back or seek an in-person evaluation if the symptoms worsen or if the condition fails to improve as anticipated.  [facility] [phone]  Other Instructions Follow up with your regular doctor in 1 week for reassessment and seek care sooner if your symptoms worsen or fail to improve.    If you have been instructed to have an in-person evaluation today at a local Urgent Care facility, please use the link below. It will take you to a list of all of our available [facility], including address, phone number and hours of operation. Please do not delay care.  [facility]  If you or a family member do not have a primary care provider, use the link below to schedule a visit and establish care. When you choose a [facility] primary care physician or advanced practice provider, you gain a long-term partner in health. Find a Primary Care Provider  Learn more about [facility]'s in-office and virtual care options: [facility] - Get Care Now

## 2024-06-28 NOTE — Progress Notes (Signed)
 Ms. Joan Thomas are scheduled for a virtual visit with your provider today.    Just as we do with appointments in the office, we must obtain your consent to participate.  Your consent will be active for this visit and any virtual visit you may have with one of our providers in the next 365 days.    If you have a MyChart account, I can also send a copy of this consent to you electronically.  All virtual visits are billed to your insurance company just like a traditional visit in the office.  As this is a virtual visit, video technology does not allow for your provider to perform a traditional examination.  This may limit your provider's ability to fully assess your condition.  If your provider identifies any concerns that need to be evaluated in person or the need to arrange testing such as labs, EKG, etc, we will make arrangements to do so.    Although advances in technology are sophisticated, we cannot ensure that it will always work on either your end or our end.  If the connection with a video visit is poor, we may have to switch to a telephone visit.  With either a video or telephone visit, we are not always able to ensure that we have a secure connection.   I need to obtain your verbal consent now.   Are you willing to proceed with your visit today?   Joan Thomas has provided verbal consent on 06/28/2024 for a virtual visit (video or telephone).   Lynden GORMAN Snuffer, PA-C 06/28/2024  3:09 PM   Date:  06/28/2024   ID:  Joan Thomas, DOB 05-14-1986, MRN [id number]  Patient Location: Home Provider Location: Home Office   Participants: Patient and Provider for Visit and Wrap up  Method of visit: Video  Location of Patient: Home Location of Provider: Home Office Consent was obtain for visit over the video. Services rendered by provider: Visit was performed via video  A video enabled telemedicine application was used and I verified that I am speaking with the correct person using two  identifiers.  PCP:  Daryl Setter, NP   Chief Complaint:  inhaled food  History of Present Illness:    Joan Thomas is a 38 y.o. female with history as stated below. Presents video telehealth for an acute care visit  Pt states that 2 days ago she was eating a salad and accidentally inhaled a piece of food. She states she coughed a lot when it first happened but since then she has not had any symptoms. Denies continued cough, sob, fevers,   Past Medical, Surgical, Social History, Allergies, and Medications have been Reviewed.  Past Medical History:  Diagnosis Date   Anxiety    GERD (gastroesophageal reflux disease)    Pancreatitis    Pancreatitis, alcoholic, acute 06/11/2021   Vaginal Pap smear, abnormal     No outpatient medications have been marked as taking for the 06/28/24 encounter (Video Visit) with [facility] PROVIDER.     Allergies:   Patient has no known allergies.   ROS See HPI for history of present illness.  Physical Exam Constitutional:      Appearance: Normal appearance.  Pulmonary:     Effort: Pulmonary effort is normal.  Neurological:     Mental Status: She is alert.               MDM: Pt concerned about possible aspiration event. She is asymptomatic at this time. Advised on watchful waiting  at this time.   Tests Ordered: No orders of the defined types were placed in this encounter.   Medication Changes: No orders of the defined types were placed in this encounter.    Disposition:  Follow up  Signed, Lynden GORMAN Snuffer, PA-C  06/28/2024 3:09 PM

## 2024-06-29 ENCOUNTER — Other Ambulatory Visit (HOSPITAL_BASED_OUTPATIENT_CLINIC_OR_DEPARTMENT_OTHER): Payer: Self-pay

## 2024-06-29 DIAGNOSIS — J22 Unspecified acute lower respiratory infection: Secondary | ICD-10-CM | POA: Diagnosis not present

## 2024-06-29 DIAGNOSIS — B9689 Other specified bacterial agents as the cause of diseases classified elsewhere: Secondary | ICD-10-CM | POA: Diagnosis not present

## 2024-06-29 MED ORDER — CLINDAMYCIN PHOS (TWICE-DAILY) 1 % EX GEL
Freq: Two times a day (BID) | CUTANEOUS | 2 refills | Status: AC
  Filled 2024-06-29: qty 30, 30d supply, fill #0

## 2024-06-29 MED ORDER — AMOXICILLIN-POT CLAVULANATE 875-125 MG PO TABS
1.0000 | ORAL_TABLET | Freq: Two times a day (BID) | ORAL | 0 refills | Status: DC
  Filled 2024-06-29: qty 14, 7d supply, fill #0

## 2024-06-29 NOTE — Progress Notes (Signed)
 Subjective:    Patient ID:  Joan Thomas is a 38 y.o. female  Chief Complaint: Chief Complaint  Patient presents with  . Aspiration     History of Present Illness This is a 38 year old female presenting with concerns of aspiration of a piece of cucumber from a salad 4d ago.  She reports an unusual sensation in her throat, which she describes as intermittent and not associated with any specific illness. She suspects it might be related to a recent incident where she aspirated a piece of cucumber from a salad. She has noticed some phlegm production but is uncertain if it is a result of the aspiration or due to irritation from coughing. Her cough is primarily triggered by the presence of phlegm. The patient reports no fever or hemoptysis. Over the past two days, she has been feeling well overall, except when lying down, at which point she experiences a sensation of something in her throat. She also reports a sensation of swallowing on one side, but then feels it on the opposite side.   Past medical, surgical and family history reviewed.    Social History   Tobacco Use: Medium Risk (06/28/2024)   Received from [facility]   Patient History   . Smoking Tobacco Use: Former   . Smokeless Tobacco Use: Never   . Passive Exposure: Not on file     Review of Systems  Constitutional:  Negative for fever.  HENT:  Negative for sore throat.   Respiratory:  Positive for cough. Negative for shortness of breath and wheezing.   Cardiovascular:  Negative for chest pain.  Gastrointestinal:  Negative for vomiting.      Objective:   Vitals:   06/29/24 1849  BP: 125/86  BP Location: Left Upper Arm  Patient Position: Sitting  Pulse: 90  Resp: 18  Temp: 98.6 F (37 C)  TempSrc: Oral  SpO2: 97%  Weight: 213 lb (96.6 kg)  Height: 5' 4 (1.626 m)    Physical Exam Vitals and nursing note reviewed.  Constitutional:      General: She is not in acute distress.    Appearance: Normal  appearance. She is not ill-appearing.  HENT:     Head: Normocephalic.     Right Ear: Tympanic membrane normal.     Left Ear: Tympanic membrane normal.     Nose: Nose normal. No congestion.     Mouth/Throat:     Pharynx: No oropharyngeal exudate or posterior oropharyngeal erythema.  Eyes:     General:        Right eye: No discharge.        Left eye: No discharge.     Conjunctiva/sclera: Conjunctivae normal.  Cardiovascular:     Rate and Rhythm: Normal rate and regular rhythm.     Heart sounds: Normal heart sounds.  Pulmonary:     Effort: Pulmonary effort is normal. No respiratory distress.     Breath sounds: Normal breath sounds. No wheezing or rales.  Chest:     Chest wall: No tenderness.  Neurological:     Mental Status: She is alert.        Assessment:    Results of tests available at time of visit: No results found for this or any previous visit (from the past 24 hours).      Imaging: XR Chest Pa And Lateral Result Date: 06/29/2024 2 views of the chest were obtained.  There is a left sided opacity.  No effusions, pneumothorax or cardiomegaly.  Plan:   1. Bacterial lower respiratory infection     Orders Placed This Encounter  Procedures  . XR Chest Pa And Lateral    Freddi was seen today for aspiration.  Diagnoses and all orders for this visit:  Bacterial lower respiratory infection -     XR Chest Pa And Lateral -     Discontinue: amoxicillin -clavulanate (AUGMENTIN ) 875-125 mg per tablet; Take one tablet by mouth 2 (two) times daily for 7 days. -     amoxicillin -clavulanate (AUGMENTIN ) 875-125 mg per tablet; Take one tablet by mouth 2 (two) times daily for 7 days.     Assessment & Plan Initial Assessment: 38 year old female concerned about aspiration of a piece of cucumber from a salad.  Differential Diagnosis: - Aspiration pneumonia: Abnormality on left side of chest x-ray. Treat with antibiotic. Follow-up with primary care next week. -  Foreign body aspiration: Signs of fluid or infection around potential object. Radiologist to review images. Future x-rays may be needed.  Urgent Care Course: - Chest x-ray obtained - Chest x-ray read by me: abnormality on left side - Antibiotic sent to pharmacy  Final Assessment: Chest x-ray showed abnormality on the left side, potentially indicating fluid or infection around a foreign object. Antibiotic initiated. Radiologist will review images. Follow-up with primary care next week. Future x-rays may be needed.  Clinical Impression: - Aspiration pneumonia - Foreign body aspiration  Disposition: - Follow-Up: Follow up with primary care next week.   Patient Education: Take antibiotic with food. Seek immediate medical attention if symptoms worsen or if fever or shortness of breath develop.   The patient indicates understanding of these issues and agrees with the plan. Bernardino ONEIDA Schiller, PA-C    Computer technology was used to create visit note. Consent from the patient/care giver was obtained prior to its use. Text above is via voice recognition transcription. - there may be typographical errors.  Patient Instructions  Your x-ray showed signs of a lower respiratory infection.  A radiologist will read your images, and if there are any changes to your plan of care, we will try to contact you. No news is good news.  Follow-up with primary care in the next 5 days for close follow-up as repeat imaging may be needed in the future to ensure this is resolving.  Repeat imaging may not happen in 5 days but may be in a few weeks to months.  Go to the ER if you have shortness of breath or fevers despite treatment.

## 2024-06-30 ENCOUNTER — Emergency Department (HOSPITAL_BASED_OUTPATIENT_CLINIC_OR_DEPARTMENT_OTHER)

## 2024-06-30 ENCOUNTER — Emergency Department (HOSPITAL_BASED_OUTPATIENT_CLINIC_OR_DEPARTMENT_OTHER)
Admission: EM | Admit: 2024-06-30 | Discharge: 2024-06-30 | Disposition: A | Discharge status: Home / Self Care | Attending: Emergency Medicine | Admitting: Emergency Medicine

## 2024-06-30 ENCOUNTER — Other Ambulatory Visit: Payer: Self-pay

## 2024-06-30 ENCOUNTER — Other Ambulatory Visit (HOSPITAL_BASED_OUTPATIENT_CLINIC_OR_DEPARTMENT_OTHER): Payer: Self-pay

## 2024-06-30 ENCOUNTER — Encounter (HOSPITAL_BASED_OUTPATIENT_CLINIC_OR_DEPARTMENT_OTHER): Payer: Self-pay

## 2024-06-30 ENCOUNTER — Telehealth: Admitting: Family

## 2024-06-30 DIAGNOSIS — R09A2 Foreign body sensation, throat: Secondary | ICD-10-CM

## 2024-06-30 DIAGNOSIS — F458 Other somatoform disorders: Secondary | ICD-10-CM | POA: Diagnosis not present

## 2024-06-30 DIAGNOSIS — R059 Cough, unspecified: Secondary | ICD-10-CM | POA: Diagnosis not present

## 2024-06-30 DIAGNOSIS — R0989 Other specified symptoms and signs involving the circulatory and respiratory systems: Secondary | ICD-10-CM | POA: Diagnosis not present

## 2024-06-30 DIAGNOSIS — R0602 Shortness of breath: Secondary | ICD-10-CM | POA: Diagnosis not present

## 2024-06-30 DIAGNOSIS — R051 Acute cough: Secondary | ICD-10-CM | POA: Diagnosis not present

## 2024-06-30 DIAGNOSIS — J690 Pneumonitis due to inhalation of food and vomit: Secondary | ICD-10-CM

## 2024-06-30 MED ORDER — SUCRALFATE 1 GM/10ML PO SUSP
1.0000 g | Freq: Once | ORAL | Status: AC
  Administered 2024-06-30: 1 g via ORAL
  Filled 2024-06-30: qty 10

## 2024-06-30 MED ORDER — SUCRALFATE 1 G PO TABS
1.0000 g | ORAL_TABLET | Freq: Three times a day (TID) | ORAL | 0 refills | Status: AC
  Filled 2024-06-30: qty 60, 15d supply, fill #0

## 2024-06-30 NOTE — Patient Instructions (Signed)
 VISIT SUMMARY:  You visited us  today due to pneumonia following a possible aspiration event. Your symptoms are improving with the current treatment, and we have a plan to monitor your progress.  YOUR PLAN:  Aspiration PNEUMONIA: You have pneumonia confirmed by a chest x-ray. Your symptoms are getting better with the medication you are taking. -Continue taking amoxicillin  as prescribed. -Continue using Mucinex as needed. -We will order a repeat chest x-ray in three weeks to check your progress. -Please return to the clinic if your symptoms get worse.

## 2024-06-30 NOTE — Progress Notes (Signed)
 MyChart Video Visit    Virtual Visit via Video Note    Patient location: Home. Patient and provider in visit Provider location: Office  I discussed the limitations of evaluation and management by telemedicine and the availability of in person appointments. The patient expressed understanding and agreed to proceed.  Visit Date: 06/30/2024  Today's healthcare provider: Eleanor GORMAN Ponto, NP     Subjective:    Patient ID: Joan Thomas, female    DOB: [birth year], 37 y.o.   MRN: [id number]  Chief Complaint  Patient presents with   Follow-up    Patient diagnosed with URI yesterday    HPI  Joan Thomas is a 38 year old female who presents for follow up from an urgent care visit yesterday where she was diagnosed with aspiration pneumonia.   She report that on 9/1 she aspirated some salad and was not able to get the food out of her lungs.  CXR noted L sided infiltrate. She was prescribed augmentin  and Mucinex. She reports improvement in her symptoms with the initiation of these meds. She has a little phlegm but not excessively so. No shortness of breath or fever. Past Medical History:  Diagnosis Date   Anxiety    GERD (gastroesophageal reflux disease)    Pancreatitis    Pancreatitis, alcoholic, acute 06/11/2021   Vaginal Pap smear, abnormal     Past Surgical History:  Procedure Laterality Date   ECTOPIC PREGNANCY SURGERY     ESOPHAGEAL MANOMETRY N/A 07/28/2017   Procedure: ESOPHAGEAL MANOMETRY (EM);  Surgeon: Shila Gustav GAILS, MD;  Location: [facility];  Service: Endoscopy;  Laterality: N/A;   PH IMPEDANCE STUDY N/A 07/28/2017   Procedure: PH IMPEDANCE STUDY;  Surgeon: Shila Gustav GAILS, MD;  Location: [facility];  Service: Endoscopy;  Laterality: N/A;    Family History  Problem Relation Age of Onset   Hypertension Mother    Multiple myeloma Father    Hypertension Sister        thrombocytosis   Arthritis Maternal Grandmother    GER disease  Maternal Grandmother    Gout Maternal Grandmother    Osteoporosis Maternal Grandmother    Diabetes Maternal Grandfather    Heart disease Maternal Grandfather    Sleep apnea Maternal Grandfather    Kidney failure Paternal Grandmother    Breast cancer Paternal Grandmother    COPD Paternal Grandmother    Prostate cancer Paternal Grandfather        several types   Breast cancer Maternal Aunt    Prostate cancer Maternal Uncle    Alcohol abuse Neg Hx    Asthma Neg Hx    Birth defects Neg Hx    Depression Neg Hx    Drug abuse Neg Hx    Early death Neg Hx    Hearing loss Neg Hx    Hyperlipidemia Neg Hx    Kidney disease Neg Hx    Learning disabilities Neg Hx    Mental illness Neg Hx    Mental retardation Neg Hx    Miscarriages / Stillbirths Neg Hx    Stroke Neg Hx    Vision loss Neg Hx    Esophageal cancer Neg Hx     Social History   Socioeconomic History   Marital status: Married    Spouse name: Not on file   Number of children: 0   Years of education: Not on file   Highest education level: Associate degree: academic program  Occupational History   Not on file  Tobacco Use   Smoking status: Former    Current packs/day: 0.50    Types: Cigarettes   Smokeless tobacco: Never   Tobacco comments:    None since 06/05/21  Vaping Use   Vaping status: Former   Substances: CBD, Flavoring, Synthetic cannabinoids   Devices: hooka  Substance and Sexual Activity   Alcohol use: Not Currently    Comment: 1/5th on the weekend   Drug use: No   Sexual activity: Yes    Partners: Male    Birth control/protection: Condom  Other Topics Concern   Not on file  Social History Narrative   Works at [employer] , completed an Metallurgist.     Separated from her husband   No children    Has a dog   Family is out of town.    Enjoys cooking/reading.    Social Drivers of Health   Financial Resource Strain: Medium Risk (06/24/2024)   Overall Financial Resource Strain (CARDIA)     Difficulty of Paying Living Expenses: Somewhat hard  Food Insecurity: Food Insecurity Present (06/24/2024)   Hunger Vital Sign    Worried About Running Out of Food in the Last Year: Sometimes true    Ran Out of Food in the Last Year: Sometimes true  Transportation Needs: No Transportation Needs (06/24/2024)   PRAPARE - Administrator, Civil Service (Medical): No    Lack of Transportation (Non-Medical): No  Physical Activity: Inactive (06/24/2024)   Exercise Vital Sign    Days of Exercise per Week: 0 days    Minutes of Exercise per Session: Not on file  Stress: No Stress Concern Present (06/24/2024)   [employer] of Occupational Health - Occupational Stress Questionnaire    Feeling of Stress: Only a little  Social Connections: Moderately Isolated (06/24/2024)   Social Connection and Isolation Panel    Frequency of Communication with Friends and Family: More than three times a week    Frequency of Social Gatherings with Friends and Family: Three times a week    Attends Religious Services: 1 to 4 times per year    Active Member of Clubs or Organizations: No    Attends Banker Meetings: Not on file    Marital Status: Divorced  Intimate Partner Violence: Not At Risk (05/04/2019)   Humiliation, Afraid, Rape, and Kick questionnaire    Fear of Current or Ex-Partner: No    Emotionally Abused: No    Physically Abused: No    Sexually Abused: No    Outpatient Medications Prior to Visit  Medication Sig Dispense Refill   ALPRAZolam  (XANAX ) 0.25 MG tablet Take 1 tablet (0.25 mg total) by mouth 2 (two) times daily as needed for anxiety. 30 tablet 0   amLODipine  (NORVASC ) 5 MG tablet Take 1 tablet (5 mg total) by mouth daily. 30 tablet 3   amoxicillin -clavulanate (AUGMENTIN ) 875-125 MG tablet Take one tablet by mouth 2 (two) times daily for 7 days. 14 tablet 0   clindamycin  (CLINDAGEL ) 1 % gel Apply topically 2 (two) times daily. 30 g 2   loratadine  (CLARITIN ) 10 MG tablet  Take 1 tablet (10 mg total) by mouth daily. 90 tablet 4   naproxen  (NAPROSYN ) 500 MG tablet Take 1 tablet (500 mg total) by mouth 2 (two) times daily as needed. 30 tablet 0   omeprazole  (PRILOSEC) 40 MG capsule Take 1 capsule (40 mg total) by mouth daily. 90 capsule 1   ondansetron  (ZOFRAN -ODT) 4 MG disintegrating tablet Take 1  tablet (4 mg total) by mouth every 8 (eight) hours as needed for nausea or vomiting. 20 tablet 0   No facility-administered medications prior to visit.    No Known Allergies  ROS See HHPI    Objective:    Physical Exam  There were no vitals taken for this visit. Wt Readings from Last 3 Encounters:  06/25/24 215 lb (97.5 kg)  05/26/24 213 lb 8 oz (96.8 kg)  02/24/24 213 lb (96.6 kg)   Gen: Awake, alert, no acute distress Resp: Breathing is even and non-labored, no increased work of breathing Psych: calm/pleasant demeanor Neuro: Alert and Oriented x 3, + facial symmetry, speech is clear.      Assessment & Plan:   Problem List Items Addressed This Visit   None Visit Diagnoses       Aspiration pneumonia of left lung, unspecified aspiration pneumonia type, unspecified part of lung (HCC)    -  Primary   Relevant Orders   DG Chest 2 View     Clinically stable/improving on augmentin  and prn mucinex. Continue same. Explained that x-ray changes may persist post clinical improvement.  - Continue amoxicillin  as prescribed. - Continue Mucinex as needed. - Order repeat chest x-ray in three weeks. - Advise to return for in person follow up if she develops fever, shortness of breath or if her symptoms do not continue to improve.   I am having Joan Thomas Ta-Ma-Ra maintain her loratadine , naproxen , omeprazole , ondansetron , ALPRAZolam , amLODipine , clindamycin , and amoxicillin -clavulanate.  No orders of the defined types were placed in this encounter.   I discussed the assessment and treatment plan with the patient. The patient was provided an  opportunity to ask questions and all were answered. The patient agreed with the plan and demonstrated an understanding of the instructions.   The patient was advised to call back or seek an in-person evaluation if the symptoms worsen or if the condition fails to improve as anticipated.   Eleanor GORMAN Ponto, NP [facility] [phone] (phone) [phone] (fax)  [facility]

## 2024-06-30 NOTE — ED Triage Notes (Signed)
 Pt states that she ate cucumber on Saturday and she states that she feels like there is something in the back of her throat. States that she coughed and something came back oup into her throat. States that she is able to swallow without difficulty. States that she went to [facility] and they told her to come to ED for evaluation. Wants to be evaluated for aspiration pneumonia. Denies SOB or difficulty breathing.

## 2024-06-30 NOTE — ED Provider Notes (Signed)
 [place] EMERGENCY DEPARTMENT AT [facility] [place] Provider Note   CSN: [id number] Arrival date & time: 06/30/24  1747     Patient presents with: No chief complaint on file.   Joan Thomas is a 38 y.o. female.   Patient with history of GERD on omeprazole  presents to the emergency department for evaluation of concern for foreign body.  Patient states that she choked while eating a salad on a cucumber a few days ago.  She has had a foreign body sensation in her throat since that time.  She is able to swallow and handle secretions.  She was seen by an urgent care who did an x-ray.  They were concerned for an aspiration pneumonia and she was started on Augmentin  yesterday.  Patient had an e-visit today, they encouraged to follow-up with treatment.  In addition after a nap today she felt like the object was higher in her throat.  This concerned her and prompted emergency department visit.  No shortness of breath or wheezing.       Prior to Admission medications   Medication Sig Start Date End Date Taking? Authorizing Provider  ALPRAZolam  (XANAX ) 0.25 MG tablet Take 1 tablet (0.25 mg total) by mouth 2 (two) times daily as needed for anxiety. 05/25/24   O'Sullivan, Melissa, NP  amLODipine  (NORVASC ) 5 MG tablet Take 1 tablet (5 mg total) by mouth daily. 05/27/24   O'Sullivan, Melissa, NP  amoxicillin -clavulanate (AUGMENTIN ) 875-125 MG tablet Take one tablet by mouth 2 (two) times daily for 7 days. 06/29/24     clindamycin  (CLINDAGEL ) 1 % gel Apply topically 2 (two) times daily. 06/29/24   O'Sullivan, Melissa, NP  loratadine  (CLARITIN ) 10 MG tablet Take 1 tablet (10 mg total) by mouth daily. 08/03/21   O'Sullivan, Melissa, NP  naproxen  (NAPROSYN ) 500 MG tablet Take 1 tablet (500 mg total) by mouth 2 (two) times daily as needed. 01/29/24   O'Sullivan, Melissa, NP  omeprazole  (PRILOSEC) 40 MG capsule Take 1 capsule (40 mg total) by mouth daily. 02/22/24   O'Sullivan, Melissa, NP  ondansetron   (ZOFRAN -ODT) 4 MG disintegrating tablet Take 1 tablet (4 mg total) by mouth every 8 (eight) hours as needed for nausea or vomiting. 05/25/24   O'Sullivan, Melissa, NP    Allergies: Patient has no known allergies.    Review of Systems  Updated Vital Signs BP (!) 141/103 (BP Location: Left Arm)   Pulse (!) 105   Temp 98.9 F (37.2 C) (Oral)   Resp 16   Ht 5' 4 (1.626 m)   Wt 97.5 kg   LMP 06/05/2024 (Exact Date)   SpO2 100%   BMI 36.90 kg/m   Physical Exam Vitals and nursing note reviewed.  Constitutional:      Appearance: She is well-developed.  HENT:     Head: Normocephalic and atraumatic.     Mouth/Throat:     Comments: Oropharynx is clear Eyes:     Conjunctiva/sclera: Conjunctivae normal.  Cardiovascular:     Rate and Rhythm: Normal rate.  Pulmonary:     Effort: No respiratory distress.     Breath sounds: No stridor. No wheezing, rhonchi or rales.     Comments: Lung sounds are clear, no increased work of breathing.  No wheezing. Musculoskeletal:     Cervical back: Normal range of motion and neck supple.  Skin:    General: Skin is warm and dry.  Neurological:     Mental Status: She is alert.     (all labs ordered  are listed, but only abnormal results are displayed) Labs Reviewed - No data to display  EKG: None  Radiology: DG Chest 2 View Result Date: 06/30/2024 CLINICAL DATA:  Shortness of breath. EXAM: CHEST - 2 VIEW COMPARISON:  Chest radiograph dated 11/14/2019. FINDINGS: The heart size and mediastinal contours are within normal limits. Both lungs are clear. The visualized skeletal structures are unremarkable. IMPRESSION: No active cardiopulmonary disease. Electronically Signed   By: Vanetta Chou M.D.   On: 06/30/2024 20:06     Procedures   Medications Ordered in the ED  sucralfate  (CARAFATE ) 1 GM/10ML suspension 1 g (has no administration in time range)   ED Course  Patient seen and examined. History obtained directly from patient.   Labs/EKG:  None ordered  Imaging: Ordered chest x-ray  Medications/Fluids: Ordered: Carafate  suspension.   Most recent vital signs reviewed and are as follows: BP (!) 141/103 (BP Location: Left Arm)   Pulse (!) 105   Temp 98.9 F (37.2 C) (Oral)   Resp 16   Ht 5' 4 (1.626 m)   Wt 97.5 kg   LMP 06/05/2024 (Exact Date)   SpO2 100%   BMI 36.90 kg/m   Initial impression: Respiratory irritation after recent choking episode, already on antibiotics and omeprazole .  She almost presents like a globus sensation.  Low concern for significant airway obstruction.  No signs of esophageal obstruction.  8:19 PM Reassessment performed. Patient appears stable, comfortable.  Drinking water.  Imaging personally visualized and interpreted including: Chest x-ray agree negative.  Reviewed pertinent lab work and imaging with patient at bedside. Questions answered.   Most current vital signs reviewed and are as follows: BP (!) 141/103 (BP Location: Left Arm)   Pulse (!) 105   Temp 98.9 F (37.2 C) (Oral)   Resp 16   Ht 5' 4 (1.626 m)   Wt 97.5 kg   LMP 06/05/2024 (Exact Date)   SpO2 100%   BMI 36.90 kg/m   Plan: Discharge to home.   Prescriptions written for: Carafate   Other home care instructions discussed: Continue omeprazole   ED return instructions discussed: New or worsening symptoms  Follow-up instructions discussed: Patient encouraged to follow-up with their PCP in 5 days.                                      Medical Decision Making Amount and/or Complexity of Data Reviewed Radiology: ordered.  Risk Prescription drug management.   Patient with some cough, chest discomfort, globus sensation after choking on a piece of cucumber several days ago.  Patient is already on medication for aspiration pneumonia.  Her chest x-ray today is clear.  No fevers.  No difficulty breathing or swallowing.  Lung sounds clear.  Pleat treatment plan as above.  No indication for emergent evaluation by  pulmonology today.      Final diagnoses:  Acute cough  Globus sensation    ED Discharge Orders          Ordered    sucralfate  (CARAFATE ) 1 g tablet  3 times daily with meals & bedtime        06/30/24 2018               Desiderio Chew, PA-C 06/30/24 2020    Zackowski, Scott, MD 07/01/24 [phone]

## 2024-06-30 NOTE — Discharge Instructions (Signed)
 Please read and follow all provided instructions.  Your diagnoses today include:  1. Acute cough   2. Globus sensation     Tests performed today include: Chest x-ray: Was clear no signs of pneumonia or other problems Vital signs. See below for your results today.   Medications prescribed:  Carafate  - for stomach upset and to protect your stomach  Take any prescribed medications only as directed.  Home care instructions:  Follow any educational materials contained in this packet.  Continue to take the omeprazole .  BE VERY CAREFUL not to take multiple medicines containing Tylenol  (also called acetaminophen ). Doing so can lead to an overdose which can damage your liver and cause liver failure and possibly death.   Follow-up instructions: Please follow-up with your primary care provider in the next 5 days for further evaluation of your symptoms if not getting better.  Return instructions:  Please return to the Emergency Department if you experience worsening symptoms.  Return with any worsening breathing symptoms or cough. Please return if you have any other emergent concerns.  Additional Information:  Your vital signs today were: BP (!) 141/103 (BP Location: Left Arm)   Pulse (!) 105   Temp 98.9 F (37.2 C) (Oral)   Resp 16   Ht 5' 4 (1.626 m)   Wt 97.5 kg   LMP 06/05/2024 (Exact Date)   SpO2 100%   BMI 36.90 kg/m  If your blood pressure (BP) was elevated above 135/85 this visit, please have this repeated by your doctor within one month. --------------

## 2024-07-01 ENCOUNTER — Other Ambulatory Visit (HOSPITAL_BASED_OUTPATIENT_CLINIC_OR_DEPARTMENT_OTHER): Payer: Self-pay

## 2024-07-05 ENCOUNTER — Ambulatory Visit (INDEPENDENT_AMBULATORY_CARE_PROVIDER_SITE_OTHER): Admitting: *Deleted

## 2024-07-05 DIAGNOSIS — Z23 Encounter for immunization: Secondary | ICD-10-CM

## 2024-07-05 NOTE — Progress Notes (Signed)
 Patient here for Gardasil vaccine per verbal from PCP.  Vaccine given in right deltoid and patient tolerated well.

## 2024-07-15 ENCOUNTER — Telehealth: Payer: Self-pay

## 2024-07-15 ENCOUNTER — Ambulatory Visit (HOSPITAL_BASED_OUTPATIENT_CLINIC_OR_DEPARTMENT_OTHER)
Admission: RE | Admit: 2024-07-15 | Discharge: 2024-07-15 | Disposition: A | Discharge status: Home / Self Care | Source: Ambulatory Visit | Attending: Family | Admitting: Family

## 2024-07-15 DIAGNOSIS — J690 Pneumonitis due to inhalation of food and vomit: Secondary | ICD-10-CM | POA: Diagnosis not present

## 2024-07-15 DIAGNOSIS — R911 Solitary pulmonary nodule: Secondary | ICD-10-CM | POA: Diagnosis not present

## 2024-07-15 NOTE — Telephone Encounter (Signed)
 Pt notified that xray is walk in, no appt needed   Copied from CRM [id number]. Topic: General - Other >> Jul 14, 2024  4:12 PM Joan Thomas wrote: Reason for CRM: patient called stating she never received a call in regards to scheduling xrays CB [phone]

## 2024-07-17 ENCOUNTER — Ambulatory Visit: Payer: Self-pay | Admitting: Family

## 2024-07-17 DIAGNOSIS — R9389 Abnormal findings on diagnostic imaging of other specified body structures: Secondary | ICD-10-CM

## 2024-07-17 NOTE — Telephone Encounter (Signed)
 Please advise patient that her x-ray shows resolution of pneumonia. They saw a shadow that they think could be a nipple shadow. Radiologist wants it repeated with nipple markers to be sure.

## 2024-07-20 ENCOUNTER — Other Ambulatory Visit (HOSPITAL_BASED_OUTPATIENT_CLINIC_OR_DEPARTMENT_OTHER): Payer: Self-pay

## 2024-07-20 DIAGNOSIS — R051 Acute cough: Secondary | ICD-10-CM | POA: Diagnosis not present

## 2024-07-20 MED ORDER — PROMETHAZINE-DM 6.25-15 MG/5ML PO SYRP
5.0000 mL | ORAL_SOLUTION | Freq: Three times a day (TID) | ORAL | 0 refills | Status: AC | PRN
  Filled 2024-07-20: qty 120, 8d supply, fill #0

## 2024-07-20 MED ORDER — BENZONATATE 200 MG PO CAPS
200.0000 mg | ORAL_CAPSULE | Freq: Three times a day (TID) | ORAL | 0 refills | Status: AC | PRN
  Filled 2024-07-20: qty 20, 7d supply, fill #0

## 2024-07-22 ENCOUNTER — Encounter: Payer: Self-pay | Admitting: Family

## 2024-07-23 ENCOUNTER — Ambulatory Visit (HOSPITAL_BASED_OUTPATIENT_CLINIC_OR_DEPARTMENT_OTHER)
Admission: RE | Admit: 2024-07-23 | Discharge: 2024-07-23 | Disposition: A | Discharge status: Home / Self Care | Source: Ambulatory Visit | Attending: Family | Admitting: Family

## 2024-07-23 DIAGNOSIS — R9389 Abnormal findings on diagnostic imaging of other specified body structures: Secondary | ICD-10-CM | POA: Diagnosis not present

## 2024-07-23 DIAGNOSIS — Z0389 Encounter for observation for other suspected diseases and conditions ruled out: Secondary | ICD-10-CM | POA: Diagnosis not present

## 2024-07-26 ENCOUNTER — Telehealth: Admitting: Physician Assistant

## 2024-07-26 ENCOUNTER — Encounter

## 2024-07-26 ENCOUNTER — Other Ambulatory Visit (HOSPITAL_BASED_OUTPATIENT_CLINIC_OR_DEPARTMENT_OTHER): Payer: Self-pay

## 2024-07-26 DIAGNOSIS — J690 Pneumonitis due to inhalation of food and vomit: Secondary | ICD-10-CM

## 2024-07-26 DIAGNOSIS — R09A2 Foreign body sensation, throat: Secondary | ICD-10-CM | POA: Diagnosis not present

## 2024-07-26 MED ORDER — PREDNISONE 20 MG PO TABS
40.0000 mg | ORAL_TABLET | Freq: Every day | ORAL | 0 refills | Status: AC
  Filled 2024-07-26: qty 14, 7d supply, fill #0

## 2024-07-26 NOTE — Patient Instructions (Signed)
 Emmie Maidens, thank you for joining [name], PA-C for today's virtual visit.  While this provider is not your primary care provider (PCP), if your PCP is located in our provider database this encounter information will be shared with them immediately following your visit.   A [facility] MyChart account gives you access to today's visit and all your visits, tests, and labs performed at [facility]  click here if you don't have a [facility] MyChart account or go to [email or web address]  Consent: (Patient) Joan Thomas provided verbal consent for this virtual visit at the beginning of the encounter.  Current Medications:  Current Outpatient Medications:    predniSONE (DELTASONE) 20 MG tablet, Take 2 tablets (40 mg total) by mouth daily with breakfast., Disp: 14 tablet, Rfl: 0   ALPRAZolam  (XANAX ) 0.25 MG tablet, Take 1 tablet (0.25 mg total) by mouth 2 (two) times daily as needed for anxiety., Disp: 30 tablet, Rfl: 0   amLODipine  (NORVASC ) 5 MG tablet, Take 1 tablet (5 mg total) by mouth daily., Disp: 30 tablet, Rfl: 3   amoxicillin -clavulanate (AUGMENTIN ) 875-125 MG tablet, Take one tablet by mouth 2 (two) times daily for 7 days., Disp: 14 tablet, Rfl: 0   benzonatate  (TESSALON ) 200 MG capsule, Take 1 capsule (200 mg total) by mouth 3 (three) times daily as needed for cough., Disp: 20 capsule, Rfl: 0   clindamycin  (CLINDAGEL ) 1 % gel, Apply topically 2 (two) times daily., Disp: 30 g, Rfl: 2   loratadine  (CLARITIN ) 10 MG tablet, Take 1 tablet (10 mg total) by mouth daily., Disp: 90 tablet, Rfl: 4   naproxen  (NAPROSYN ) 500 MG tablet, Take 1 tablet (500 mg total) by mouth 2 (two) times daily as needed., Disp: 30 tablet, Rfl: 0   omeprazole  (PRILOSEC) 40 MG capsule, Take 1 capsule (40 mg total) by mouth daily., Disp: 90 capsule, Rfl: 1   ondansetron  (ZOFRAN -ODT) 4 MG disintegrating tablet, Take 1 tablet (4 mg total) by mouth every 8 (eight) hours as needed for  nausea or vomiting., Disp: 20 tablet, Rfl: 0   promethazine -dextromethorphan (PROMETHAZINE -DM) 6.25-15 MG/5ML syrup, Take 5 mLs by mouth 3 (three) times daily as needed for cough., Disp: 120 mL, Rfl: 0   sucralfate  (CARAFATE ) 1 g tablet, Take 1 tablet (1 g total) by mouth 4 (four) times daily -  with meals and at bedtime., Disp: 60 tablet, Rfl: 0   Medications ordered in this encounter:  Meds ordered this encounter  Medications   predniSONE (DELTASONE) 20 MG tablet    Sig: Take 2 tablets (40 mg total) by mouth daily with breakfast.    Dispense:  14 tablet    Refill:  0    Supervising Provider:   BLAISE ALEENE KIDD [[id number]]     *If you need refills on other medications prior to your next appointment, please contact your pharmacy*  Follow-Up: Call back or seek an in-person evaluation if the symptoms worsen or if the condition fails to improve as anticipated.  [facility] [phone]   If you have been instructed to have an in-person evaluation today at a local Urgent Care facility, please use the link below. It will take you to a list of all of our available [facility]gent [facility], including address, phone number and hours of operation. Please do not delay care.   Urgent Cares  If you or a family member do not have a primary care provider, use the link below to schedule a visit and  establish care. When you choose a [facility] primary care physician or advanced practice provider, you gain a long-term partner in health. Find a Primary Care Provider  Learn more about [facility] in-office and virtual care options: [facility] - Get Care Now

## 2024-07-26 NOTE — Progress Notes (Signed)
 Virtual Visit Consent   Joan Thomas, you are scheduled for a virtual visit with a [facility] provider today. Just as with appointments in the office, your consent must be obtained to participate. Your consent will be active for this visit and any virtual visit you may have with one of our providers in the next 365 days. If you have a MyChart account, a copy of this consent can be sent to you electronically.  As this is a virtual visit, video technology does not allow for your provider to perform a traditional examination. This may limit your provider's ability to fully assess your condition. If your provider identifies any concerns that need to be evaluated in person or the need to arrange testing (such as labs, EKG, etc.), we will make arrangements to do so. Although advances in technology are sophisticated, we cannot ensure that it will always work on either your end or our end. If the connection with a video visit is poor, the visit may have to be switched to a telephone visit. With either a video or telephone visit, we are not always able to ensure that we have a secure connection.  By engaging in this virtual visit, you consent to the provision of healthcare and authorize for your insurance to be billed (if applicable) for the services provided during this visit. Depending on your insurance coverage, you may receive a charge related to this service.  I need to obtain your verbal consent now. Are you willing to proceed with your visit today? Joan Thomas has provided verbal consent on 07/26/2024 for a virtual visit (video or telephone). Joan [name], PA-C  Date: 07/26/2024 7:16 PM   Virtual Visit via Video Note   I, Joan Thomas, connected with  Joan Thomas  ([id number], [birth year]) on 07/26/24 at  4:45 PM EDT by a video-enabled telemedicine application and verified that I am speaking with the correct person using two identifiers.  Location: Patient: Virtual Visit  Location Patient: Home Provider: Virtual Visit Location Provider: Home Office   I discussed the limitations of evaluation and management by telemedicine and the availability of in person appointments. The patient expressed understanding and agreed to proceed.    History of Present Illness: Joan Thomas is a 38 y.o. who identifies as a female who was assigned female at birth, and is being seen today for continued globus sensation and mild cough following aspiration pneumonia. She choked on a cucumber at the beginning of September 2025 and developed aspiration pneumonia. She was treated with antibiotics and cough medication.   She did have resolution of the pneumonia noted on CXR but there had been a new small abnormality noted that may be nipple artifact. She had the repeat CXR for this on Friday, 07/23/24, but it has not yet resulted.  She was having continued cough and mucus production last week and advised to continue Mucinex. She does report the cough is finally improving and she does not have as much mucus production.   However, she does continue to have a globus sensation. She does have GERD and has been taking Omeprazole  40mg  and Sucralfate  1g ACHS. She does have anxiety as well so she is unsure if that contributes.    Problems:  Patient Active Problem List   Diagnosis Date Noted   Aspiration pneumonia of left lung (HCC) 06/30/2024   Hypertension 02/04/2024   Chronic alcoholic pancreatitis (HCC) 02/04/2024   Obesity (BMI 35.0-39.9 without comorbidity) 02/03/2024   Weight gain 04/15/2023   Acne  04/15/2023   Preventative health care 02/13/2022   Anxiety 06/11/2021   High risk HPV infection 07/20/2019   GERD (gastroesophageal reflux disease)     Allergies: No Known Allergies Medications:  Current Outpatient Medications:    predniSONE (DELTASONE) 20 MG tablet, Take 2 tablets (40 mg total) by mouth daily with breakfast., Disp: 14 tablet, Rfl: 0   ALPRAZolam  (XANAX ) 0.25 MG  tablet, Take 1 tablet (0.25 mg total) by mouth 2 (two) times daily as needed for anxiety., Disp: 30 tablet, Rfl: 0   amLODipine  (NORVASC ) 5 MG tablet, Take 1 tablet (5 mg total) by mouth daily., Disp: 30 tablet, Rfl: 3   amoxicillin -clavulanate (AUGMENTIN ) 875-125 MG tablet, Take one tablet by mouth 2 (two) times daily for 7 days., Disp: 14 tablet, Rfl: 0   benzonatate  (TESSALON ) 200 MG capsule, Take 1 capsule (200 mg total) by mouth 3 (three) times daily as needed for cough., Disp: 20 capsule, Rfl: 0   clindamycin  (CLINDAGEL ) 1 % gel, Apply topically 2 (two) times daily., Disp: 30 g, Rfl: 2   loratadine  (CLARITIN ) 10 MG tablet, Take 1 tablet (10 mg total) by mouth daily., Disp: 90 tablet, Rfl: 4   naproxen  (NAPROSYN ) 500 MG tablet, Take 1 tablet (500 mg total) by mouth 2 (two) times daily as needed., Disp: 30 tablet, Rfl: 0   omeprazole  (PRILOSEC) 40 MG capsule, Take 1 capsule (40 mg total) by mouth daily., Disp: 90 capsule, Rfl: 1   ondansetron  (ZOFRAN -ODT) 4 MG disintegrating tablet, Take 1 tablet (4 mg total) by mouth every 8 (eight) hours as needed for nausea or vomiting., Disp: 20 tablet, Rfl: 0   promethazine -dextromethorphan (PROMETHAZINE -DM) 6.25-15 MG/5ML syrup, Take 5 mLs by mouth 3 (three) times daily as needed for cough., Disp: 120 mL, Rfl: 0   sucralfate  (CARAFATE ) 1 g tablet, Take 1 tablet (1 g total) by mouth 4 (four) times daily -  with meals and at bedtime., Disp: 60 tablet, Rfl: 0  Observations/Objective: Patient is well-developed, well-nourished in no acute distress.  Resting comfortably at home.  Head is normocephalic, atraumatic.  No labored breathing.  Speech is clear and coherent with logical content.  Patient is alert and oriented at baseline.    Assessment and Plan: 1. Aspiration pneumonia of left lower lobe due to regurgitated food (HCC) (Primary) - predniSONE (DELTASONE) 20 MG tablet; Take 2 tablets (40 mg total) by mouth daily with breakfast.  Dispense: 14 tablet;  Refill: 0  2. Globus sensation  - Awaiting results from CXR on Friday but having improvement in cough and mucus - Will add prednisone for possible inflammation of the airway that is could be causing globus sensation - Advised to follow up with PCP to discuss potential referral to GI for further evaluation for globus sensation and GERD   Follow Up Instructions: I discussed the assessment and treatment plan with the patient. The patient was provided an opportunity to ask questions and all were answered. The patient agreed with the plan and demonstrated an understanding of the instructions.  A copy of instructions were sent to the patient via MyChart unless otherwise noted below.    The patient was advised to call back or seek an in-person evaluation if the symptoms worsen or if the condition fails to improve as anticipated.    Joan [name], PA-C

## 2024-07-28 ENCOUNTER — Telehealth: Payer: Self-pay

## 2024-07-28 DIAGNOSIS — R1310 Dysphagia, unspecified: Secondary | ICD-10-CM

## 2024-07-28 DIAGNOSIS — R051 Acute cough: Secondary | ICD-10-CM | POA: Diagnosis not present

## 2024-07-28 DIAGNOSIS — K219 Gastro-esophageal reflux disease without esophagitis: Secondary | ICD-10-CM | POA: Diagnosis not present

## 2024-07-28 DIAGNOSIS — R09A2 Foreign body sensation, throat: Secondary | ICD-10-CM | POA: Diagnosis not present

## 2024-07-28 NOTE — Telephone Encounter (Signed)
 Reading room called to expedite xray reading.  Patient will like a new referral to GI Dr. Legrand Shove for  still having trouble swallowing/ chocking   Copied from CRM [id number]. Topic: Clinical - Lab/Test Results >> Jul 26, 2024  1:30 PM Joan Thomas wrote: Reason for CRM: Patient is calling to follow up on x-ray she had done on 07/23/2024, I advised results have not come in yet. She would like to know if there is anyway to expedite the results.

## 2024-07-29 ENCOUNTER — Ambulatory Visit: Payer: Self-pay | Admitting: Family

## 2024-07-30 DIAGNOSIS — R1319 Other dysphagia: Secondary | ICD-10-CM | POA: Diagnosis not present

## 2024-08-03 DIAGNOSIS — R1310 Dysphagia, unspecified: Secondary | ICD-10-CM | POA: Diagnosis not present

## 2024-08-03 DIAGNOSIS — K449 Diaphragmatic hernia without obstruction or gangrene: Secondary | ICD-10-CM | POA: Diagnosis not present

## 2024-08-03 DIAGNOSIS — K3189 Other diseases of stomach and duodenum: Secondary | ICD-10-CM | POA: Diagnosis not present

## 2024-08-24 DIAGNOSIS — K219 Gastro-esophageal reflux disease without esophagitis: Secondary | ICD-10-CM | POA: Diagnosis not present

## 2024-08-24 DIAGNOSIS — R09A2 Foreign body sensation, throat: Secondary | ICD-10-CM | POA: Diagnosis not present

## 2024-08-29 ENCOUNTER — Other Ambulatory Visit: Payer: Self-pay | Admitting: Family

## 2024-08-29 DIAGNOSIS — K219 Gastro-esophageal reflux disease without esophagitis: Secondary | ICD-10-CM

## 2024-08-30 ENCOUNTER — Encounter: Payer: Self-pay | Admitting: Family

## 2024-08-30 ENCOUNTER — Other Ambulatory Visit: Payer: Self-pay

## 2024-08-30 ENCOUNTER — Other Ambulatory Visit (HOSPITAL_BASED_OUTPATIENT_CLINIC_OR_DEPARTMENT_OTHER): Payer: Self-pay

## 2024-08-30 DIAGNOSIS — K219 Gastro-esophageal reflux disease without esophagitis: Secondary | ICD-10-CM

## 2024-08-30 MED ORDER — ONDANSETRON 4 MG PO TBDP
4.0000 mg | ORAL_TABLET | Freq: Three times a day (TID) | ORAL | 0 refills | Status: AC | PRN
  Filled 2024-08-30: qty 20, 7d supply, fill #0

## 2024-08-30 MED ORDER — ALPRAZOLAM 0.25 MG PO TABS
0.2500 mg | ORAL_TABLET | Freq: Two times a day (BID) | ORAL | 0 refills | Status: DC | PRN
  Filled 2024-08-30: qty 30, 15d supply, fill #0

## 2024-08-30 MED ORDER — OMEPRAZOLE 40 MG PO CPDR
40.0000 mg | DELAYED_RELEASE_CAPSULE | Freq: Every day | ORAL | 1 refills | Status: DC
  Filled 2024-08-30: qty 90, 90d supply, fill #0

## 2024-08-31 ENCOUNTER — Other Ambulatory Visit (HOSPITAL_BASED_OUTPATIENT_CLINIC_OR_DEPARTMENT_OTHER): Payer: Self-pay

## 2024-08-31 MED ORDER — OMEPRAZOLE 40 MG PO CPDR
40.0000 mg | DELAYED_RELEASE_CAPSULE | Freq: Every day | ORAL | 1 refills | Status: AC
Start: 2024-08-31 — End: ?

## 2024-09-01 ENCOUNTER — Other Ambulatory Visit: Payer: Self-pay | Admitting: Family

## 2024-09-01 ENCOUNTER — Other Ambulatory Visit: Payer: Self-pay

## 2024-09-01 ENCOUNTER — Other Ambulatory Visit (HOSPITAL_BASED_OUTPATIENT_CLINIC_OR_DEPARTMENT_OTHER): Payer: Self-pay

## 2024-09-01 MED ORDER — AMLODIPINE BESYLATE 5 MG PO TABS
5.0000 mg | ORAL_TABLET | Freq: Every day | ORAL | 0 refills | Status: DC
  Filled 2024-09-01: qty 90, 90d supply, fill #0

## 2024-09-02 ENCOUNTER — Ambulatory Visit

## 2024-09-02 ENCOUNTER — Other Ambulatory Visit (HOSPITAL_BASED_OUTPATIENT_CLINIC_OR_DEPARTMENT_OTHER): Payer: Self-pay

## 2024-09-02 ENCOUNTER — Other Ambulatory Visit (HOSPITAL_COMMUNITY)
Admission: RE | Admit: 2024-09-02 | Discharge: 2024-09-02 | Disposition: A | Discharge status: Home / Self Care | Source: Ambulatory Visit | Attending: Family Medicine | Admitting: Family Medicine

## 2024-09-02 VITALS — BP 125/83 | HR 73 | Ht 64.0 in | Wt 203.1 lb

## 2024-09-02 DIAGNOSIS — Z202 Contact with and (suspected) exposure to infections with a predominantly sexual mode of transmission: Secondary | ICD-10-CM

## 2024-09-02 NOTE — Progress Notes (Signed)
 SUBJECTIVE:  38 y.o. female who desires a STI screen. Denies abnormal vaginal discharge, bleeding or significant pelvic pain. No UTI symptoms. Confirms possible known exposure to STD.  Patient's last menstrual period was 08/27/2024 (exact date).  OBJECTIVE:  She appears well.   ASSESSMENT:  STI Screen   PLAN:  Pt offered STI blood screening-declined GC, chlamydia, and trichomonas probe sent to lab.  Treatment: To be determined once lab results are received.  Pt follow up as needed.   Shawnee Fleet, CMA

## 2024-09-06 LAB — CERVICOVAGINAL ANCILLARY ONLY
Bacterial Vaginitis (gardnerella): POSITIVE — AB
Candida Glabrata: NEGATIVE
Candida Vaginitis: NEGATIVE
Chlamydia: NEGATIVE
Comment: NEGATIVE
Comment: NEGATIVE
Comment: NEGATIVE
Comment: NEGATIVE
Comment: NEGATIVE
Comment: NORMAL
Neisseria Gonorrhea: NEGATIVE
Trichomonas: NEGATIVE

## 2024-09-07 ENCOUNTER — Ambulatory Visit

## 2024-09-07 ENCOUNTER — Ambulatory Visit (INDEPENDENT_AMBULATORY_CARE_PROVIDER_SITE_OTHER)

## 2024-09-07 ENCOUNTER — Other Ambulatory Visit (HOSPITAL_BASED_OUTPATIENT_CLINIC_OR_DEPARTMENT_OTHER): Payer: Self-pay

## 2024-09-07 ENCOUNTER — Ambulatory Visit: Payer: Self-pay | Admitting: Family Medicine

## 2024-09-07 DIAGNOSIS — Z23 Encounter for immunization: Secondary | ICD-10-CM | POA: Diagnosis not present

## 2024-09-07 MED ORDER — METRONIDAZOLE 500 MG PO TABS
500.0000 mg | ORAL_TABLET | Freq: Two times a day (BID) | ORAL | 0 refills | Status: AC
  Filled 2024-09-07: qty 14, 7d supply, fill #0

## 2024-09-07 NOTE — Progress Notes (Signed)
 Patient here today for second dose of HPV , given in left deltoid IM , tolerated well.

## 2024-09-08 ENCOUNTER — Other Ambulatory Visit: Payer: Self-pay | Admitting: Family Medicine

## 2024-09-08 ENCOUNTER — Other Ambulatory Visit (HOSPITAL_BASED_OUTPATIENT_CLINIC_OR_DEPARTMENT_OTHER): Payer: Self-pay

## 2024-09-08 MED ORDER — METRONIDAZOLE 0.75 % VA GEL
1.0000 | Freq: Every day | VAGINAL | 1 refills | Status: AC
  Filled 2024-09-08: qty 70, 5d supply, fill #0
  Filled 2024-10-18: qty 70, 5d supply, fill #1

## 2024-09-08 NOTE — Progress Notes (Signed)
 Not tolerating oral

## 2024-10-18 ENCOUNTER — Other Ambulatory Visit: Payer: Self-pay

## 2024-10-18 ENCOUNTER — Other Ambulatory Visit (HOSPITAL_BASED_OUTPATIENT_CLINIC_OR_DEPARTMENT_OTHER): Payer: Self-pay

## 2024-10-18 ENCOUNTER — Other Ambulatory Visit: Payer: Self-pay | Admitting: Family

## 2024-10-18 MED ORDER — ALPRAZOLAM 0.25 MG PO TABS
0.2500 mg | ORAL_TABLET | Freq: Two times a day (BID) | ORAL | 0 refills | Status: DC | PRN
  Filled 2024-10-18: qty 30, 15d supply, fill #0

## 2024-10-29 ENCOUNTER — Other Ambulatory Visit (HOSPITAL_BASED_OUTPATIENT_CLINIC_OR_DEPARTMENT_OTHER): Payer: Self-pay

## 2024-11-24 ENCOUNTER — Other Ambulatory Visit: Payer: Self-pay | Admitting: Family

## 2024-11-24 ENCOUNTER — Other Ambulatory Visit: Payer: Self-pay

## 2024-11-24 ENCOUNTER — Other Ambulatory Visit (HOSPITAL_BASED_OUTPATIENT_CLINIC_OR_DEPARTMENT_OTHER): Payer: Self-pay

## 2024-11-24 MED ORDER — ALPRAZOLAM 0.25 MG PO TABS
0.2500 mg | ORAL_TABLET | Freq: Two times a day (BID) | ORAL | 0 refills | Status: AC | PRN
  Filled 2024-11-24: qty 30, 15d supply, fill #0

## 2024-11-24 MED ORDER — AMLODIPINE BESYLATE 5 MG PO TABS
5.0000 mg | ORAL_TABLET | Freq: Every day | ORAL | 0 refills | Status: AC
  Filled 2024-11-24: qty 90, 90d supply, fill #0

## 2024-11-24 NOTE — Telephone Encounter (Signed)
 Requesting: alprazolam  0.25mg   Contract:07/05/24 UDS:02/03/24 Last Visit: 06/30/24 Next Visit: 12/31/24 Last Refill: 10/18/24 #30 and 0RF   Please Advise

## 2024-11-29 ENCOUNTER — Other Ambulatory Visit (HOSPITAL_BASED_OUTPATIENT_CLINIC_OR_DEPARTMENT_OTHER): Payer: Self-pay

## 2024-12-02 ENCOUNTER — Other Ambulatory Visit: Payer: Self-pay | Admitting: Medical Genetics

## 2024-12-08 ENCOUNTER — Ambulatory Visit

## 2024-12-28 ENCOUNTER — Encounter: Admitting: Family

## 2024-12-31 ENCOUNTER — Encounter: Admitting: Family
